# Patient Record
Sex: Female | Born: 1985 | Race: Black or African American | Hispanic: No | Marital: Married | Smoking: Never smoker
Health system: Southern US, Community
[De-identification: ages and names within clinical notes are randomized; demographics above are authoritative.]

## PROBLEM LIST (undated history)

## (undated) DIAGNOSIS — E559 Vitamin D deficiency, unspecified: Secondary | ICD-10-CM

## (undated) DIAGNOSIS — F32A Depression, unspecified: Secondary | ICD-10-CM

## (undated) DIAGNOSIS — M549 Dorsalgia, unspecified: Secondary | ICD-10-CM

## (undated) DIAGNOSIS — E119 Type 2 diabetes mellitus without complications: Secondary | ICD-10-CM

## (undated) DIAGNOSIS — G4730 Sleep apnea, unspecified: Secondary | ICD-10-CM

## (undated) DIAGNOSIS — F329 Major depressive disorder, single episode, unspecified: Secondary | ICD-10-CM

## (undated) DIAGNOSIS — T7840XA Allergy, unspecified, initial encounter: Secondary | ICD-10-CM

## (undated) DIAGNOSIS — K219 Gastro-esophageal reflux disease without esophagitis: Secondary | ICD-10-CM

## (undated) DIAGNOSIS — E282 Polycystic ovarian syndrome: Secondary | ICD-10-CM

## (undated) DIAGNOSIS — R0600 Dyspnea, unspecified: Secondary | ICD-10-CM

## (undated) HISTORY — DX: Vitamin D deficiency, unspecified: E55.9

## (undated) HISTORY — DX: Depression, unspecified: F32.A

## (undated) HISTORY — DX: Type 2 diabetes mellitus without complications: E11.9

## (undated) HISTORY — DX: Dyspnea, unspecified: R06.00

## (undated) HISTORY — DX: Sleep apnea, unspecified: G47.30

## (undated) HISTORY — DX: Allergy, unspecified, initial encounter: T78.40XA

## (undated) HISTORY — DX: Dorsalgia, unspecified: M54.9

## (undated) HISTORY — DX: Major depressive disorder, single episode, unspecified: F32.9

## (undated) HISTORY — PX: WISDOM TOOTH EXTRACTION: SHX21

---

## 2012-05-09 ENCOUNTER — Other Ambulatory Visit: Payer: Self-pay | Admitting: Family Medicine

## 2012-05-09 DIAGNOSIS — G43909 Migraine, unspecified, not intractable, without status migrainosus: Secondary | ICD-10-CM

## 2012-05-12 ENCOUNTER — Other Ambulatory Visit: Payer: Self-pay

## 2012-05-20 ENCOUNTER — Ambulatory Visit (INDEPENDENT_AMBULATORY_CARE_PROVIDER_SITE_OTHER): Payer: 59 | Admitting: Family Medicine

## 2012-05-20 VITALS — BP 118/78 | HR 56 | Temp 98.1°F | Resp 16 | Ht 65.0 in | Wt 261.0 lb

## 2012-05-20 DIAGNOSIS — R197 Diarrhea, unspecified: Secondary | ICD-10-CM

## 2012-05-20 DIAGNOSIS — R1110 Vomiting, unspecified: Secondary | ICD-10-CM

## 2012-05-20 LAB — POCT URINALYSIS DIPSTICK
Nitrite, UA: NEGATIVE
Protein, UA: NEGATIVE
Spec Grav, UA: 1.015
Urobilinogen, UA: 0.2

## 2012-05-20 LAB — POCT UA - MICROSCOPIC ONLY
Crystals, Ur, HPF, POC: NEGATIVE
Yeast, UA: NEGATIVE

## 2012-05-20 LAB — LIPASE: Lipase: <10 U/L (ref 0–75)

## 2012-05-20 LAB — COMPREHENSIVE METABOLIC PANEL
Albumin: 4.9 g/dL (ref 3.5–5.2)
Alkaline Phosphatase: 81 U/L (ref 39–117)
CO2: 27 mEq/L (ref 19–32)
Calcium: 9.8 mg/dL (ref 8.4–10.5)
Chloride: 101 mEq/L (ref 96–112)
Glucose, Bld: 89 mg/dL (ref 70–99)
Potassium: 4.4 mEq/L (ref 3.5–5.3)
Sodium: 137 mEq/L (ref 135–145)
Total Protein: 8.4 g/dL — ABNORMAL HIGH (ref 6.0–8.3)

## 2012-05-20 LAB — POCT CBC
Granulocyte percent: 55.1 %G (ref 37–80)
HCT, POC: 46.1 % (ref 37.7–47.9)
Hemoglobin: 14.7 g/dL (ref 12.2–16.2)
MCV: 89.3 fL (ref 80–97)
POC Granulocyte: 3.9 (ref 2–6.9)
RBC: 5.16 M/uL (ref 4.04–5.48)

## 2012-05-20 MED ORDER — LOPERAMIDE HCL 2 MG PO TABS: 2.0000 mg | ORAL_TABLET | Freq: Four times a day (QID) | ORAL | Status: DC | PRN

## 2012-05-20 MED ORDER — ONDANSETRON 4 MG PO TBDP
8.0000 mg | ORAL_TABLET | Freq: Once | ORAL | Status: AC
  Administered 2012-05-20: 8 mg via ORAL

## 2012-05-20 MED ORDER — PROMETHAZINE HCL 25 MG PO TABS: 25.0000 mg | ORAL_TABLET | Freq: Three times a day (TID) | ORAL | Status: DC | PRN

## 2012-05-20 NOTE — Progress Notes (Signed)
Subjective:    Patient ID: Diane Padilla, female    DOB: 04-12-1986, 26 y.o.   MRN: [id number] Chief Complaint  Patient presents with  . Abdominal Pain    x 1 days  . Nausea  . Diarrhea    HPI  Last night becan having vague abdominal pain and emesis with diarrhea. No sleep last night.  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 Not improving at all. No f/c. Has not been able to keep anything down is able to keep down a little but of water today.  Urine normal. Abd pain still there but improving, intermittent.  No peritoneal signs.  Not sexually active - has been years so very sure no exposures.  Past Medical History  Diagnosis Date  . Allergy    No current outpatient prescriptions on file prior to visit.   No current facility-administered medications on file prior to visit.   History   Social History  . Marital Status: Single    Spouse Name: N/A    Number of Children: N/A  . 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 Smoking status: Never Smoker   . Smokeless tobacco: Never Used  . Alcohol Use: Yes     Comment: rare if ever  . Drug Use: No  . Sexually Active: Not Currently   Other Topics Concern  . None   Social History Narrative  . None     Review of Systems  Constitutional: Positive for activity change, appetite change and fatigue. Negative for fever, chills and unexpected weight change.  Respiratory: Negative for shortness of breath.   Cardiovascular: Negative for chest pain and leg swelling.  Gastrointestinal: Positive for nausea, vomiting, abdominal pain, diarrhea and abdominal distention. Negative for constipation, blood in stool and anal bleeding.  Genitourinary: Negative for dysuria, decreased urine volume and difficulty urinating.  Musculoskeletal: Negative for gait problem.  Skin: Negative for rash.  Hematological: Negative for adenopathy.  Psychiatric/Behavioral:  Positive for sleep disturbance.      BP 118/78  Pulse 56  Temp 98.1 F (36.7 C) (Oral)  Resp 16  Ht 5\' 5"  (1.651 m)  Wt 261 lb (118.389 kg)  BMI 43.43 kg/m2  SpO2 100%  LMP 03/17/2012 Objective:   Physical Exam  Constitutional: She is oriented to person, place, and time. She appears well-developed and well-nourished. No distress.  HENT:  Head: Normocephalic and atraumatic.  Neck: Normal range of motion. Neck supple. No thyromegaly present.  Cardiovascular: Normal rate, regular rhythm, normal heart sounds and intact distal pulses.   Pulmonary/Chest: Effort normal and breath sounds normal. No respiratory distress.  Abdominal: Soft. Bowel sounds are normal. She exhibits no distension and no mass. There is tenderness. There is no rebound, no guarding and no CVA tenderness.  Musculoskeletal: She exhibits no edema.  Lymphadenopathy:    She has no cervical adenopathy.  Neurological: She is alert and oriented to person, place, and time.  Skin: Skin is warm and dry. She is not diaphoretic. No erythema.  Psychiatric: She has a normal mood and affect. Her behavior is normal.          Results for orders placed in visit on 05/20/12  POCT CBC      Component Value Range   WBC 7.1  4.6 - 10.2 K/uL   Lymph, poc 2.8  0.6 - 3.4   POC LYMPH PERCENT 40.0  10 - 50 %  L   MID (cbc) 0.3  0 - 0.9   POC MID % 4.9  0 - 12 %M   POC Granulocyte 3.9  2 - 6.9   Granulocyte percent 55.1  37 - 80 %G   RBC 5.16  4.04 - 5.48 M/uL   Hemoglobin 14.7  12.2 - 16.2 g/dL   HCT, POC 16.1  09.6 - 47.9 %   MCV 89.3  80 - 97 fL   MCH, POC 28.5  27 - 31.2 pg   MCHC 31.9  31.8 - 35.4 g/dL   RDW, POC 04.5     Platelet Count, POC 447 (*) 142 - 424 K/uL   MPV 8.8  0 - 99.8 fL  POCT URINALYSIS DIPSTICK      Component Value Range   Color, UA yellow     Clarity, UA clear     Glucose, UA neg     Bilirubin, UA neg     Ketones, UA neg     Spec Grav, UA 1.015     Blood, UA tr     pH, UA 7.0     Protein, UA neg      Urobilinogen, UA 0.2     Nitrite, UA neg     Leukocytes, UA Trace    POCT UA - MICROSCOPIC ONLY      Component Value Range   WBC, Ur, HPF, POC 10-15     RBC, urine, microscopic 0-2     Bacteria, U Microscopic trace     Mucus, UA neg     Epithelial cells, urine per micros 5-8     Crystals, Ur, HPF, POC neg     Casts, Ur, LPF, POC neg     Yeast, UA neg      Assessment & Plan:  Vomiting - Plan: POCT CBC, Comprehensive metabolic panel, POCT urinalysis dipstick, POCT UA - Microscopic Only, ondansetron (ZOFRAN-ODT) disintegrating tablet 8 mg, Lipase, promethazine (PHENERGAN) 25 MG tablet  Diarrhea - Plan: POCT CBC, Comprehensive metabolic panel, loperamide (IMODIUM A-D) 2 MG tablet  Meds ordered this encounter  Medications  . Vitamin D, Ergocalciferol, (DRISDOL) 50000 UNITS CAPS    Sig: Take 50,000 Units by mouth every 7 (seven) days.  . ondansetron (ZOFRAN-ODT) disintegrating tablet 8 mg    Sig:   . promethazine (PHENERGAN) 25 MG tablet    Sig: Take 1 tablet (25 mg total) by mouth every 8 (eight) hours as needed for nausea.    Dispense:  20 tablet    Refill:  0  . loperamide (IMODIUM A-D) 2 MG tablet    Sig: Take 1 tablet (2 mg total) by mouth 4 (four) times daily as needed for diarrhea or loose stools.    Dispense:  30 tablet    Refill:  0

## 2012-09-24 ENCOUNTER — Ambulatory Visit (INDEPENDENT_AMBULATORY_CARE_PROVIDER_SITE_OTHER): Payer: 59 | Admitting: Physician Assistant

## 2012-09-24 VITALS — BP 110/68 | HR 82 | Temp 98.3°F | Resp 18 | Ht 64.5 in | Wt 275.8 lb

## 2012-09-24 DIAGNOSIS — R059 Cough, unspecified: Secondary | ICD-10-CM

## 2012-09-24 DIAGNOSIS — J029 Acute pharyngitis, unspecified: Secondary | ICD-10-CM

## 2012-09-24 DIAGNOSIS — J3489 Other specified disorders of nose and nasal sinuses: Secondary | ICD-10-CM

## 2012-09-24 DIAGNOSIS — R05 Cough: Secondary | ICD-10-CM

## 2012-09-24 DIAGNOSIS — R0981 Nasal congestion: Secondary | ICD-10-CM

## 2012-09-24 LAB — POCT CBC
Granulocyte percent: 61 %G (ref 37–80)
HCT, POC: 41.9 % (ref 37.7–47.9)
Hemoglobin: 13.6 g/dL (ref 12.2–16.2)
Lymph, poc: 3.3 (ref 0.6–3.4)
MCH, POC: 28.4 pg (ref 27–31.2)
MCHC: 32.5 g/dL (ref 31.8–35.4)
MCV: 87.4 fL (ref 80–97)
MID (cbc): 0.6 (ref 0–0.9)
MPV: 8.9 fL (ref 0–99.8)
POC Granulocyte: 6 (ref 2–6.9)
POC LYMPH PERCENT: 32.9 %L (ref 10–50)
POC MID %: 6.1 %M (ref 0–12)
Platelet Count, POC: 430 10*3/uL — AB (ref 142–424)
RBC: 4.79 M/uL (ref 4.04–5.48)
RDW, POC: 13.3 %
WBC: 9.9 10*3/uL (ref 4.6–10.2)

## 2012-09-24 LAB — POCT INFLUENZA A/B
Influenza A, POC: NEGATIVE
Influenza B, POC: NEGATIVE

## 2012-09-24 MED ORDER — IPRATROPIUM BROMIDE 0.03 % NA SOLN: 2.0000 | Freq: Two times a day (BID) | NASAL | Status: DC

## 2012-09-24 MED ORDER — HYDROCODONE-HOMATROPINE 5-1.5 MG/5ML PO SYRP: 5.0000 mL | ORAL_SOLUTION | Freq: Three times a day (TID) | ORAL | Status: DC | PRN

## 2012-09-24 NOTE — Progress Notes (Signed)
Subjective:    Patient ID: Diane Padilla, female    DOB: January 10, 1986, 27 y.o.   MRN: [id number]  HPI 27 year old female presents with acute onset of nasal congestion, cough, sore throat, chills, and subjective fever.  Admits symptoms started suddenly yesterday and have progressively worsened today.  Denies sinus pressure, otalgia, nausea, vomiting, abdominal pain, headache, or dizziness.  No OTC treatments tried yet.    Patient otherwise healthy with no other concerns today.  Admits to daily multivitamin.   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    Review of Systems  Constitutional: Positive for fever (subjective) and chills.  HENT: Positive for congestion, sore throat and rhinorrhea. Negative for ear pain, neck stiffness and postnasal drip.   Respiratory: Positive for cough. Negative for shortness of breath and wheezing.   Gastrointestinal: Negative for nausea, vomiting and abdominal pain.  Neurological: Negative for dizziness, light-headedness and headaches.       Objective:   Physical Exam  Constitutional: She is oriented to person, place, and time. She appears well-developed and well-nourished.  HENT:  Head: Normocephalic and atraumatic.  Right Ear: Hearing, tympanic membrane, external ear and ear canal normal.  Left Ear: Hearing, tympanic membrane, external ear and ear canal normal.  Nose: Right sinus exhibits no maxillary sinus tenderness and no frontal sinus tenderness. Left sinus exhibits no maxillary sinus tenderness and no frontal sinus tenderness.  Mouth/Throat: Uvula is midline, oropharynx is clear and moist and mucous membranes are normal. No oropharyngeal exudate.  Eyes: Conjunctivae are normal.  Neck: Normal range of motion. Neck supple.  Cardiovascular: Normal rate, regular rhythm and normal heart sounds.   Pulmonary/Chest: Effort normal and breath sounds normal.  Lymphadenopathy:    She has no cervical adenopathy.  Neurological: She is alert and oriented to person,  place, and time.  Psychiatric: She has a normal mood and affect. Her behavior is normal. Judgment and thought content normal.     Results for orders placed in visit on 09/24/12  POCT INFLUENZA A/B      Result Value Range   Influenza A, POC Negative     Influenza B, POC Negative    POCT CBC      Result Value Range   WBC 9.9  4.6 - 10.2 K/uL   Lymph, poc 3.3  0.6 - 3.4   POC LYMPH PERCENT 32.9  10 - 50 %L   MID (cbc) 0.6  0 - 0.9   POC MID % 6.1  0 - 12 %M   POC Granulocyte 6.0  2 - 6.9   Granulocyte percent 61.0  37 - 80 %G   RBC 4.79  4.04 - 5.48 M/uL   Hemoglobin 13.6  12.2 - 16.2 g/dL   HCT, POC 57.8  46.9 - 47.9 %   MCV 87.4  80 - 97 fL   MCH, POC 28.4  27 - 31.2 pg   MCHC 32.5  31.8 - 35.4 g/dL   RDW, POC 62.9     Platelet Count, POC 430 (*) 142 - 424 K/uL   MPV 8.9  0 - 99.8 fL        Assessment & Plan:  Nasal congestion - Plan: POCT Influenza A/B  Cough - Plan: POCT Influenza A/B, POCT CBC  Acute pharyngitis - Plan: POCT CBC  Likely viral illness.   Will treat symptomatically with Atrovent NS and Hycodan q8hours as needed.  If no improvement in 48-72 hours, may call in amoxicillin 875 mg bid x 7 days  Follow up indicated if cough becomes productive or fever of greater than 100.0 develops.

## 2012-09-26 ENCOUNTER — Ambulatory Visit (INDEPENDENT_AMBULATORY_CARE_PROVIDER_SITE_OTHER): Payer: 59 | Admitting: Nurse Practitioner

## 2012-09-26 ENCOUNTER — Encounter: Payer: Self-pay | Admitting: Nurse Practitioner

## 2012-09-26 VITALS — BP 120/64 | HR 70 | Resp 18 | Ht 65.0 in | Wt 275.0 lb

## 2012-09-26 DIAGNOSIS — Z0000 Encounter for general adult medical examination without abnormal findings: Secondary | ICD-10-CM

## 2012-09-26 DIAGNOSIS — B3731 Acute candidiasis of vulva and vagina: Secondary | ICD-10-CM

## 2012-09-26 DIAGNOSIS — B373 Candidiasis of vulva and vagina: Secondary | ICD-10-CM

## 2012-09-26 LAB — POCT URINALYSIS DIPSTICK
Bilirubin, UA: NEGATIVE
Leukocytes, UA: NEGATIVE
Nitrite, UA: NEGATIVE

## 2012-09-26 MED ORDER — FLUCONAZOLE 150 MG PO TABS: 150.0000 mg | ORAL_TABLET | Freq: Once | ORAL | Status: DC

## 2012-09-26 NOTE — Progress Notes (Signed)
Subjective:     Patient ID: Diane Padilla, female   DOB: 14-Nov-1985, 27 y.o.   MRN: [id number] Patient was scheduled for AEX but appointment as too early, she will be rescheduled for later.  Vaginal Itching The patient's primary symptoms include genital itching, a genital odor and a vaginal discharge. The patient's pertinent negatives include no genital rash or vaginal bleeding. Primary symptoms comment: Pt. noted an small "bump" in the area above mons at the hair line.  Area is not painful but uncomfortable.  She does not shave this area.  Also vaginal discharge that is white, itching, and musty odor.. This is a new problem. Episode onset: 1.5  - 2 wks. The problem occurs daily. The problem has been unchanged. The pain is mild. Affected Side: middle. She is not pregnant. Associated symptoms include diarrhea, frequency, headaches, rash and a sore throat. Associated symptoms comments: On 09/23/12  went to PCP and diagnosed with viral URI. Treated with nasal spray ad cough meds. No fever/chills. States she was given medications but did not take them.  Her symptoms are some better on their own.  Urinary frequency is normal for her.. The vaginal discharge was clear, milky and white. There has been no bleeding. She has tried nothing for the symptoms. She is not sexually active. Her menstrual history has been irregular (Mense irregular secondary to PCOS.  Her lLMP was  08/17/12 without provera challenge (since pt. refused to take).  PMP was 02/2012.).     Review of Systems  Constitutional: Positive for fatigue.  HENT: Positive for sore throat, rhinorrhea, postnasal drip and sinus pressure.        Recent viral URI symptoms  Respiratory: Positive for cough.   Gastrointestinal: Positive for diarrhea.       History of possible IBS  Genitourinary: Positive for frequency, vaginal discharge and menstrual problem.       History of iregular menses  Skin: Positive for rash.  Neurological: Positive for headaches.   Psychiatric/Behavioral: Negative.        Objective:   Physical Exam  Constitutional: Vital signs are normal. She appears well-developed and well-nourished.  Abdominal: Soft. She exhibits no distension and no mass. There is no tenderness. There is no rebound and no guarding.  Genitourinary: Vaginal discharge found.  White milky vagina discharge. Ph: 5 KOH: + yeast Saline: - clue  Neurological: She is alert.  Skin: Skin is warm and dry.  At the top of mons pubis at midline is a very small papule that looks like an ingrown hair.  Very small about 2 mm in size without exudate or pain. No intervention needed.   Psychiatric: She has a normal mood and affect. Her behavior is normal. Judgment and thought content normal.       Assessment:      Yeast Vaginitis acute an chronic  Sebaceous cyst at top of mons pubis  Hx. Oligo/hypomenorrhea secondary to most likely PCOS    Plan:      Diflucan 150 mg today and repeat in 48 hrs. Then weekly X 2 more doses.  At AEX in a month  may even try a weekly suppression dose for her for several months.  For the cyst on upper mons she may apply warm compress and treat symptomatic otherwise no  intervention is needed.  Still encourage pt to keep menses record.

## 2012-09-26 NOTE — Patient Instructions (Addendum)
Monilial Vaginitis Vaginitis in a soreness, swelling and redness (inflammation) of the vagina and vulva. Monilial vaginitis is not a sexually transmitted infection. CAUSES  Yeast vaginitis is caused by yeast (candida) that is normally found in your vagina. With a yeast infection, the candida has overgrown in number to a point that upsets the chemical balance. SYMPTOMS   White, thick vaginal discharge.  Swelling, itching, redness and irritation of the vagina and possibly the lips of the vagina (vulva).  Burning or painful urination.  Painful intercourse. DIAGNOSIS  Things that may contribute to monilial vaginitis are:  Postmenopausal and virginal states.  Pregnancy.  Infections.  Being tired, sick or stressed, especially if you had monilial vaginitis in the past.  Diabetes. Good control will help lower the chance.  Birth control pills.  Tight fitting garments.  Using bubble bath, feminine sprays, douches or deodorant tampons.  Taking certain medications that kill germs (antibiotics).  Sporadic recurrence can occur if you become ill. TREATMENT  Your caregiver will give you medication.  There are several kinds of anti monilial vaginal creams and suppositories specific for monilial vaginitis. For recurrent yeast infections, use a suppository or cream in the vagina 2 times a week, or as directed.  Anti-monilial or steroid cream for the itching or irritation of the vulva may also be used. Get your caregiver's permission.  Painting the vagina with methylene blue solution may help if the monilial cream does not work.  Eating yogurt may help prevent monilial vaginitis. HOME CARE INSTRUCTIONS   Finish all medication as prescribed.  Do not have sex until treatment is completed or after your caregiver tells you it is okay.  Take warm sitz baths.  Do not douche.  Do not use tampons, especially scented ones.  Wear cotton underwear.  Avoid tight pants and panty  hose.  Tell your sexual partner that you have a yeast infection. They should go to their caregiver if they have symptoms such as mild rash or itching.  Your sexual partner should be treated as well if your infection is difficult to eliminate.  Practice safer sex. Use condoms.  Some vaginal medications cause latex condoms to fail. Vaginal medications that harm condoms are:  Cleocin cream.  Butoconazole (Femstat).  Terconazole (Terazol) vaginal suppository.  Miconazole (Monistat) (may be purchased over the counter). SEEK MEDICAL CARE IF:   You have a temperature by mouth above 102 F (38.9 C).  The infection is getting worse after 2 days of treatment.  The infection is not getting better after 3 days of treatment.  You develop blisters in or around your vagina.  You develop vaginal bleeding, and it is not your menstrual period.  You have pain when you urinate.  You develop intestinal problems.  You have pain with sexual intercourse. Document Released: 03/21/2005 Document Revised: 09/03/2011 Document Reviewed: 12/03/2008 [employer] Patient Information 2013 [employer].  

## 2012-09-28 NOTE — Progress Notes (Signed)
Encounter reviewed by Dr. Anayansi Rundquist Silva.  

## 2012-11-18 ENCOUNTER — Ambulatory Visit: Payer: 59 | Admitting: Nurse Practitioner

## 2012-12-10 ENCOUNTER — Telehealth: Payer: Self-pay | Admitting: Nurse Practitioner

## 2012-12-10 NOTE — Telephone Encounter (Signed)
Patient is having discharge and itching .Needs an appointment as soon as possible. Can't find a spot to put her in.

## 2012-12-11 ENCOUNTER — Encounter: Payer: Self-pay | Admitting: *Deleted

## 2012-12-11 NOTE — Telephone Encounter (Signed)
Spoke with pt who has been having itching and discharge since about Monday. Requesting appt. Scheduled OV with TL tomorrow at 10:45.

## 2012-12-12 ENCOUNTER — Ambulatory Visit (INDEPENDENT_AMBULATORY_CARE_PROVIDER_SITE_OTHER): Payer: 59 | Admitting: Gynecology

## 2012-12-12 VITALS — BP 114/68 | HR 80 | Resp 18 | Ht 65.0 in | Wt 276.0 lb

## 2012-12-12 DIAGNOSIS — N898 Other specified noninflammatory disorders of vagina: Secondary | ICD-10-CM

## 2012-12-12 DIAGNOSIS — N915 Oligomenorrhea, unspecified: Secondary | ICD-10-CM

## 2012-12-12 MED ORDER — MEDROXYPROGESTERONE ACETATE 10 MG PO TABS: 10.0000 mg | ORAL_TABLET | Freq: Every day | ORAL | Status: DC

## 2012-12-12 NOTE — Patient Instructions (Signed)
Watch menses withdraw bleed, should lighten over the ensuring months, call for contraception if become sexually active

## 2012-12-12 NOTE — Progress Notes (Signed)
26 y.o.Single African American female [id number] with a 5 day(s) history of the following:discharge described as white and curd-like Sexually active: no  Pt also reports the following associated symptoms: none Patient has not tried over the counter treatment.Pt has had multiple yeast infections in the past with similar symptoms and reports relief after treatments, no treatment failures. No douching. In addition, pt reports rare menstrual cycles, hesitant to take medications, LMP 100m ago    Exam:  RUE:[id number]                Vag:no lesions, pH 4.5, wet prep done                Cx:  normal appearance                Uterus:normal shape and consistency                Adnexa: no mass, fullness, tenderness  Wet Prep shows:vaginal discharge and no patholgens   Dx:no pathogens identified causing these symptoms   JX:BJYN, pt assured Oligomenorrhea- discussed the risks of lack of menses, risks of uterine cancer discussed, recommend as pt is not sexually active monthly progestin, aware initial cycles will be heavy but should lighten with time.  Pt is aware if sexually active, will need contraceptive dose-agrees

## 2013-01-09 ENCOUNTER — Ambulatory Visit: Payer: 59 | Admitting: Gynecology

## 2013-01-09 ENCOUNTER — Encounter: Payer: Self-pay | Admitting: Gynecology

## 2013-01-09 ENCOUNTER — Ambulatory Visit (INDEPENDENT_AMBULATORY_CARE_PROVIDER_SITE_OTHER): Payer: 59 | Admitting: Gynecology

## 2013-01-09 VITALS — BP 132/76 | HR 74 | Resp 14 | Ht 65.0 in | Wt 282.2 lb

## 2013-01-09 DIAGNOSIS — N912 Amenorrhea, unspecified: Secondary | ICD-10-CM

## 2013-01-09 DIAGNOSIS — E282 Polycystic ovarian syndrome: Secondary | ICD-10-CM | POA: Insufficient documentation

## 2013-01-09 DIAGNOSIS — Z113 Encounter for screening for infections with a predominantly sexual mode of transmission: Secondary | ICD-10-CM

## 2013-01-09 DIAGNOSIS — N898 Other specified noninflammatory disorders of vagina: Secondary | ICD-10-CM

## 2013-01-09 MED ORDER — MEDROXYPROGESTERONE ACETATE 10 MG PO TABS: 10.0000 mg | ORAL_TABLET | Freq: Every day | ORAL | Status: DC

## 2013-01-09 MED ORDER — LEVONORGESTREL-ETHINYL ESTRAD 0.1-20 MG-MCG PO TABS: 1.0000 | ORAL_TABLET | Freq: Every day | ORAL | Status: DC

## 2013-01-09 NOTE — Progress Notes (Signed)
Subjective:     Patient ID: Diane Padilla, female   DOB: January 23, 1986, 27 y.o.   MRN: [id number]  HPI Comments: Pt here for pregnancy test.  When seen last in office was not sexually active so declined contraception.  Pt states that she is now in a relationship and has been having sex without condoms.  Pt states that she didn't think she could get pregnant because of her PCOS.  We had discussed the risks of uterine cancer with prolonged periods of amenorrhea.  LMP February 2014, prior 04/2012.  Pt had been virginal before this.  Pt is unsure if they will remain sexually active at this time, may want to hold off for marriage    Review of Systems  Genitourinary: Positive for vaginal bleeding (related to first coitus), vaginal discharge (noticed with since sex), vaginal pain (first sexual contact ) and menstrual problem (pcos, oligomenorrhea). Negative for genital sores and pelvic pain.  All other systems reviewed and are negative.       Objective:   Physical Exam  Constitutional: She is oriented to person, place, and time. She appears well-developed and well-nourished.  Neurological: She is alert and oriented to person, place, and time.  Skin: Skin is warm and dry.  Pelvic exam: VULVA: normal appearing vulva with no masses, tenderness or lesions, VAGINA: normal appearing vagina with normal color and discharge, no lesions, CERVIX: normal appearing cervix without discharge or lesions, Nabothian cyst at 12 o'clock, UTERUS: uterus is normal size, shape, consistency and nontender, ADNEXA: no masses, limited by habitus.      Assessment:     Oligomenorrhea Contraceptive management PCOS     Plan:     Discussed again the risks of anovulatory bleeding, as she has been sexually active, we should consider contraception.  We reviewed oral and nonoral options-ocp, implants, IUD.  Pt was informed to expect irregular bleeding initially since she has not had a cycle in several months and there will be  build up, she understands, and would like to try ocp Stressed importance of condoms regularly GC/CTM from cervix WP not done

## 2013-01-09 NOTE — Patient Instructions (Signed)
Polycystic Ovarian Syndrome Polycystic ovarian syndrome is a condition with a number of problems. One problem is with the ovaries. The ovaries are organs located in the female pelvis, on each side of the uterus. Usually, during the menstrual cycle, an egg is released from 1 ovary every month. This is called ovulation. When the egg is fertilized, it goes into the womb (uterus), which allows for the growth of a baby. The egg travels from the ovary through the fallopian tube to the uterus. The ovaries also make the hormones estrogen and progesterone. These hormones help the development of a woman's breasts, body shape, and body hair. They also regulate the menstrual cycle and pregnancy. Sometimes, cysts form in the ovaries. A cyst is a fluid-filled sac. On the ovary, different types of cysts can form. The most common type of ovarian cyst is called a functional or ovulation cyst. It is normal, and often forms during the normal menstrual cycle. Each month, a woman's ovaries grow tiny cysts that hold the eggs. When an egg is fully grown, the sac breaks open. This releases the egg. Then, the sac which released the egg from the ovary dissolves. In one type of functional cyst, called a follicle cyst, the sac does not break open to release the egg. It may actually continue to grow. This type of cyst usually disappears within 1 to 3 months.  One type of cyst problem with the ovaries is called Polycystic Ovarian Syndrome (PCOS). In this condition, many follicle cysts form, but do not rupture and produce an egg. This health problem can affect the following:  Menstrual cycle.  Heart.  Obesity.  Cancer of the uterus.  Fertility.  Blood vessels.  Hair growth (face and body) or baldness.  Hormones.  Appearance.  High blood pressure.  Stroke.  Insulin production.  Inflammation of the liver.  Elevated blood cholesterol and triglycerides. CAUSES   No one knows the exact cause of PCOS.  Women with  PCOS often have a mother or sister with PCOS. There is not yet enough proof to say this is inherited.  Many women with PCOS have a weight problem.  Researchers are looking at the relationship between PCOS and the body's ability to make insulin. Insulin is a hormone that regulates the change of sugar, starches, and other food into energy for the body's use, or for storage. Some women with PCOS make too much insulin. It is possible that the ovaries react by making too many female hormones, called androgens. This can lead to acne, excessive hair growth, weight gain, and ovulation problems.  Too much production of luteinizing hormone (LH) from the pituitary gland in the brain stimulates the ovary to produce too much female hormone (androgen). SYMPTOMS   Infrequent or no menstrual periods, and/or irregular bleeding.  Inability to get pregnant (infertility), because of not ovulating.  Increased growth of hair on the face, chest, stomach, back, thumbs, thighs, or toes.  Acne, oily skin, or dandruff.  Pelvic pain.  Weight gain or obesity, usually carrying extra weight around the waist.  Type 2 diabetes (this is the diabetes that usually does not need insulin).  High cholesterol.  High blood pressure.  Female-pattern baldness or thinning hair.  Patches of thickened and dark brown or black skin on the neck, arms, breasts, or thighs.  Skin tags, or tiny excess flaps of skin, in the armpits or neck area.  Sleep apnea (excessive snoring and breathing stops at times while asleep).  Deepening of the voice.  Gestational diabetes when pregnant.  Increased risk of miscarriage with pregnancy. DIAGNOSIS  There is no single test to diagnose PCOS.   Your caregiver will:  Take a medical history.  Perform a pelvic exam.  Perform an ultrasound.  Check your female and female hormone levels.  Measure glucose or sugar levels in the blood.  Do other blood tests.  If you are producing too many  female hormones, your caregiver will make sure it is from PCOS. At the physical exam, your caregiver will want to evaluate the areas of increased hair growth. Try to allow natural hair growth for a few days before the visit.  During a pelvic exam, the ovaries may be enlarged or swollen by the increased number of small cysts. This can be seen more easily by vaginal ultrasound or screening, to examine the ovaries and lining of the uterus (endometrium) for cysts. The uterine lining may become thicker, if there has not been a regular period. TREATMENT  Because there is no cure for PCOS, it needs to be managed to prevent problems. Treatments are based on your symptoms. Treatment is also based on whether you want to have a baby or whether you need contraception.  Treatment may include:  Progesterone hormone, to start a menstrual period.  Birth control pills, to make you have regular menstrual periods.  Medicines to make you ovulate, if you want to get pregnant.  Medicines to control your insulin.  Medicine to control your blood pressure.  Medicine and diet, to control your high cholesterol and triglycerides in your blood.  Surgery, making small holes in the ovary, to decrease the amount of female hormone production. This is done through a long, lighted tube (laparoscope), placed into the pelvis through a tiny incision in the lower abdomen. Your caregiver will go over some of the choices with you. WOMEN WITH PCOS HAVE THESE CHARACTERISTICS:  High levels of female hormones called androgens.  An irregular or no menstrual cycle.  May have many small cysts in their ovaries. PCOS is the most common hormonal reproductive problem in women of childbearing age. WHY DO WOMEN WITH PCOS HAVE TROUBLE WITH THEIR MENSTRUAL CYCLE? Each month, about 20 eggs start to mature in the ovaries. As one egg grows and matures, the follicle breaks open to release the egg, so it can travel through the fallopian tube for  fertilization. When the single egg leaves the follicle, ovulation takes place. In women with PCOS, the ovary does not make all of the hormones it needs for any of the eggs to fully mature. They may start to grow and accumulate fluid, but no one egg becomes large enough. Instead, some may remain as cysts. Since no egg matures or is released, ovulation does not occur and the hormone progesterone is not made. Without progesterone, a woman's menstrual cycle is irregular or absent. Also, the cysts produce female hormones, which continue to prevent ovulation.  Document Released: 10/05/2004 Document Revised: 09/03/2011 Document Reviewed: 04/29/2009 [facility] Patient Information 2014 [place], [place]. Oral Contraception Information Oral contraceptives (OCs) are medicines taken to prevent pregnancy. OCs work by preventing the ovaries from releasing eggs. The hormones in OCs also cause the cervical mucus to thicken, preventing the sperm from entering the uterus. The hormones also cause the uterine lining to become thin, not allowing a fertilized egg to attach to the inside of the uterus. OCs are highly effective when taken exactly as prescribed. However, OCs do not prevent sexually transmitted diseases (STDs). Safe sex practices, such as  using condoms along with the pill, can help prevent STDs.  Before taking the pill, you may have a physical exam and Pap test. Your caregiver may order blood tests that may be necessary. Your caregiver will make sure you are a good candidate for oral contraception. Discuss with your caregiver the possible side effects of the OC you may be prescribed. When starting an OC, it can take 2 to 3 months for the body to adjust to the changes in hormone levels in your body.  TYPES OF ORAL CONTRACEPTION  The combination pill. This pill contains estrogen and progestin (synthetic progesterone) hormones. The combination pill comes in either 21-day or 28-day packs. With 21-day packs, you do not take  pills for 7 days after the last pill. With 28-day packs, the pill is taken every day. The last 7 pills are without hormones. Certain types of pills have more than 21 hormone-containing pills.  The minipill. This pill contains the progesterone hormone only. It is taken every day continuously. The minipill comes in packs of 91 pills. The first 84 pills contain the hormones, and the last 7 pills do not. The last 7 days are when you will have your menstrual period. You may experience irregular spotting. ADVANTAGES  Decreases premenstrual symptoms.  Treats menstrual period cramps.  Regulates the menstrual cycle.  Decreases a heavy menstrual flow.  Treats acne.  Treats abnormal uterine bleeding.  Treats chronic pelvic pain.  Treats polycystic ovarian syndrome.  Treats endometriosis.  Can be used as emergency contraception. DISADVANTAGES OCs can be less effective if:  You forget to take the pill at the same time every day.  You have a stomach or intestinal disease that lessens the absorption of the pill.  You take OCs with other medicines that make OCs less effective.  You take expired OCs.  You forget to restart the pill on day 7, when using the packs of 21 pills. Document Released: 09/01/2002 Document Revised: 09/03/2011 Document Reviewed: 10/18/2010 [facility] Patient Information 2014 [place], [place].

## 2013-01-13 LAB — IPS N GONORRHOEA AND CHLAMYDIA BY PCR

## 2013-02-02 ENCOUNTER — Ambulatory Visit (INDEPENDENT_AMBULATORY_CARE_PROVIDER_SITE_OTHER): Payer: 59 | Admitting: Emergency Medicine

## 2013-02-02 VITALS — BP 134/80 | HR 75 | Temp 98.0°F | Resp 18 | Ht 65.5 in | Wt 282.8 lb

## 2013-02-02 DIAGNOSIS — N946 Dysmenorrhea, unspecified: Secondary | ICD-10-CM

## 2013-02-02 DIAGNOSIS — J039 Acute tonsillitis, unspecified: Secondary | ICD-10-CM

## 2013-02-02 MED ORDER — PENICILLIN V POTASSIUM 500 MG PO TABS: 500.0000 mg | ORAL_TABLET | Freq: Four times a day (QID) | ORAL | Status: DC

## 2013-02-02 NOTE — Patient Instructions (Addendum)
Strep Throat  Strep throat is an infection of the throat caused by a bacteria named Streptococcus pyogenes. Your caregiver may call the infection streptococcal "tonsillitis" or "pharyngitis" depending on whether there are signs of inflammation in the tonsils or back of the throat. Strep throat is most common in children aged 29 15 years during the cold months of the year, but it can occur in people of any age during any season. This infection is spread from person to person (contagious) through coughing, sneezing, or other close contact.  SYMPTOMS   · Fever or chills.  · Painful, swollen, red tonsils or throat.  · Pain or difficulty when swallowing.  · White or yellow spots on the tonsils or throat.  · Swollen, tender lymph nodes or "glands" of the neck or under the jaw.  · Red rash all over the body (rare).  DIAGNOSIS   Many different infections can cause the same symptoms. A test must be done to confirm the diagnosis so the right treatment can be given. A "rapid strep test" can help your caregiver make the diagnosis in a few minutes. If this test is not available, a light swab of the infected area can be used for a throat culture test. If a throat culture test is done, results are usually available in a day or two.  TREATMENT   Strep throat is treated with antibiotic medicine.  HOME CARE INSTRUCTIONS   · Gargle with 1 tsp of salt in 1 cup of warm water, 3 4 times per day or as needed for comfort.  · Family members who also have a sore throat or fever should be tested for strep throat and treated with antibiotics if they have the strep infection.  · Make sure everyone in your household washes their hands well.  · Do not share food, drinking cups, or personal items that could cause the infection to spread to others.  · You may need to eat a soft food diet until your sore throat gets better.  · Drink enough water and fluids to keep your urine clear or pale yellow. This will help prevent dehydration.  · Get plenty of  rest.  · Stay home from school, daycare, or work until you have been on antibiotics for 24 hours.  · Only take over-the-counter or prescription medicines for pain, discomfort, or fever as directed by your caregiver.  · If antibiotics are prescribed, take them as directed. Finish them even if you start to feel better.  SEEK MEDICAL CARE IF:   · The glands in your neck continue to enlarge.  · You develop a rash, cough, or earache.  · You cough up green, yellow-brown, or bloody sputum.  · You have pain or discomfort not controlled by medicines.  · Your problems seem to be getting worse rather than better.  SEEK IMMEDIATE MEDICAL CARE IF:   · You develop any new symptoms such as vomiting, severe headache, stiff or painful neck, chest pain, shortness of breath, or trouble swallowing.  · You develop severe throat pain, drooling, or changes in your voice.  · You develop swelling of the neck, or the skin on the neck becomes red and tender.  · You have a fever.  · You develop signs of dehydration, such as fatigue, dry mouth, and decreased urination.  · You become increasingly sleepy, or you cannot wake up completely.  Document Released: 06/08/2000 Document Revised: 05/28/2012 Document Reviewed: 08/10/2010  Exit[employer]® Patient Information ©2014 [employer].

## 2013-02-02 NOTE — Progress Notes (Signed)
[facility] [address], [place] [address] [phone]  Date:  02/02/2013   Name:  Diane Padilla   DOB:  [birth year]   MRN:  [id number]  PCP:  No PCP Per Patient    Chief Complaint: Menstrual Problem, Sore Throat and Cough   History of Present Illness:  Diane Padilla is a 27 y.o. very pleasant female patient who presents with the following:  Ill with sore throat Thursday.  No fever or chills.  No nausea or vomiting.  Clear nasal drainage occasionally mucopurulent.  Has post nasal drainage.  No nausea or vomiting.  No cough.  No improvement with over the counter medications or other home remedies. Denies other complaint or health concern today.   Patient Active Problem List   Diagnosis Date Noted  . PCOS (polycystic ovarian syndrome) 01/09/2013  . Oligomenorrhea 12/12/2012    Past Medical History  Diagnosis Date  . Allergy     History reviewed. No pertinent past surgical history.  History  Substance Use Topics  . Smoking status: Never Smoker   . Smokeless tobacco: Never Used  . Alcohol Use: Yes     Comment: rare if ever    Family History  Problem Relation Age of Onset  . Diabetes Mother   . Hypertension Mother   . Hypertension Maternal Grandmother   . Hyperlipidemia Maternal Grandmother     No Known Allergies  Medication list has been reviewed and updated.  Current Outpatient Prescriptions on File Prior to Visit  Medication Sig Dispense Refill  . levonorgestrel-ethinyl estradiol (AVIANE,ALESSE,LESSINA) 0.1-20 MG-MCG tablet Take 1 tablet by mouth daily.  1 Package  12  . medroxyPROGESTERone (PROVERA) 10 MG tablet Take 1 tablet (10 mg total) by mouth daily.  10 tablet  0   No current facility-administered medications on file prior to visit.    Review of Systems:  As per HPI, otherwise negative.    Physical Examination: Filed Vitals:   02/02/13 1353  BP: 134/80  Pulse: 75  Temp: 98 F (36.7 C)  Resp: 18   Filed Vitals:    02/02/13 1353  Height: 5' 5.5" (1.664 m)  Weight: 282 lb 12.8 oz (128.277 kg)   Body mass index is 46.33 kg/(m^2). Ideal Body Weight: Weight in (lb) to have BMI = 25: 152.2  GEN: WDWN, NAD, Non-toxic, A & O x 3 HEENT: Atraumatic, Normocephalic. Neck supple. No masses, No LAD.  Exudative tonsillitis Ears and Nose: No external deformity. CV: RRR, No M/G/R. No JVD. No thrill. No extra heart sounds. PULM: CTA B, no wheezes, crackles, rhonchi. No retractions. No resp. distress. No accessory muscle use. ABD: S, NT, ND, +BS. No rebound. No HSM. EXTR: No c/c/e NEURO Normal gait.  PSYCH: Normally interactive. Conversant. Not depressed or anxious appearing.  Calm demeanor.    Assessment and Plan: Tonsillitis penvk   Signed,  Phillips Odor, MD

## 2013-03-13 ENCOUNTER — Ambulatory Visit: Payer: 59 | Admitting: Gynecology

## 2013-03-20 ENCOUNTER — Encounter: Payer: Self-pay | Admitting: Nurse Practitioner

## 2013-03-20 ENCOUNTER — Ambulatory Visit (INDEPENDENT_AMBULATORY_CARE_PROVIDER_SITE_OTHER): Payer: 59 | Admitting: Nurse Practitioner

## 2013-03-20 VITALS — BP 130/88 | HR 88 | Ht 65.0 in | Wt 292.0 lb

## 2013-03-20 DIAGNOSIS — N912 Amenorrhea, unspecified: Secondary | ICD-10-CM

## 2013-03-20 DIAGNOSIS — E282 Polycystic ovarian syndrome: Secondary | ICD-10-CM

## 2013-03-20 MED ORDER — MEDROXYPROGESTERONE ACETATE 10 MG PO TABS: 10.0000 mg | ORAL_TABLET | Freq: Every day | ORAL | Status: DC

## 2013-03-20 NOTE — Patient Instructions (Addendum)
Polycystic Ovarian Syndrome  Polycystic ovarian syndrome is a condition with a number of problems. One problem is with the ovaries. The ovaries are organs located in the female pelvis, on each side of the uterus. Usually, during the menstrual cycle, an egg is released from 1 ovary every month. This is called ovulation. When the egg is fertilized, it goes into the womb (uterus), which allows for the growth of a baby. The egg travels from the ovary through the fallopian tube to the uterus. The ovaries also make the hormones estrogen and progesterone. These hormones help the development of a woman's breasts, body shape, and body hair. They also regulate the menstrual cycle and pregnancy.  Sometimes, cysts form in the ovaries. A cyst is a fluid-filled sac. On the ovary, different types of cysts can form. The most common type of ovarian cyst is called a functional or ovulation cyst. It is normal, and often forms during the normal menstrual cycle. Each month, a woman's ovaries grow tiny cysts that hold the eggs. When an egg is fully grown, the sac breaks open. This releases the egg. Then, the sac which released the egg from the ovary dissolves. In one type of functional cyst, called a follicle cyst, the sac does not break open to release the egg. It may actually continue to grow. This type of cyst usually disappears within 1 to 3 months.   One type of cyst problem with the ovaries is called Polycystic Ovarian Syndrome (PCOS). In this condition, many follicle cysts form, but do not rupture and produce an egg. This health problem can affect the following:  · Menstrual cycle.  · Heart.  · Obesity.  · Cancer of the uterus.  · Fertility.  · Blood vessels.  · Hair growth (face and body) or baldness.  · Hormones.  · Appearance.  · High blood pressure.  · Stroke.  · Insulin production.  · Inflammation of the liver.  · Elevated blood cholesterol and triglycerides.  CAUSES   · No one knows the exact cause of PCOS.  · Women with  PCOS often have a mother or sister with PCOS. There is not yet enough proof to say this is inherited.  · Many women with PCOS have a weight problem.  · Researchers are looking at the relationship between PCOS and the body's ability to make insulin. Insulin is a hormone that regulates the change of sugar, starches, and other food into energy for the body's use, or for storage. Some women with PCOS make too much insulin. It is possible that the ovaries react by making too many female hormones, called androgens. This can lead to acne, excessive hair growth, weight gain, and ovulation problems.  · Too much production of luteinizing hormone (LH) from the pituitary gland in the brain stimulates the ovary to produce too much female hormone (androgen).  SYMPTOMS   · Infrequent or no menstrual periods, and/or irregular bleeding.  · Inability to get pregnant (infertility), because of not ovulating.  · Increased growth of hair on the face, chest, stomach, back, thumbs, thighs, or toes.  · Acne, oily skin, or dandruff.  · Pelvic pain.  · Weight gain or obesity, usually carrying extra weight around the waist.  · Type 2 diabetes (this is the diabetes that usually does not need insulin).  · High cholesterol.  · High blood pressure.  · Female-pattern baldness or thinning hair.  · Patches of thickened and dark brown or black skin on the neck, arms, breasts,   or thighs.  · Skin tags, or tiny excess flaps of skin, in the armpits or neck area.  · Sleep apnea (excessive snoring and breathing stops at times while asleep).  · Deepening of the voice.  · Gestational diabetes when pregnant.  · Increased risk of miscarriage with pregnancy.  DIAGNOSIS   There is no single test to diagnose PCOS.   · Your caregiver will:  · Take a medical history.  · Perform a pelvic exam.  · Perform an ultrasound.  · Check your female and female hormone levels.  · Measure glucose or sugar levels in the blood.  · Do other blood tests.  · If you are producing too many  female hormones, your caregiver will make sure it is from PCOS. At the physical exam, your caregiver will want to evaluate the areas of increased hair growth. Try to allow natural hair growth for a few days before the visit.  · During a pelvic exam, the ovaries may be enlarged or swollen by the increased number of small cysts. This can be seen more easily by vaginal ultrasound or screening, to examine the ovaries and lining of the uterus (endometrium) for cysts. The uterine lining may become thicker, if there has not been a regular period.  TREATMENT   Because there is no cure for PCOS, it needs to be managed to prevent problems. Treatments are based on your symptoms. Treatment is also based on whether you want to have a baby or whether you need contraception.   Treatment may include:  · Progesterone hormone, to start a menstrual period.  · Birth control pills, to make you have regular menstrual periods.  · Medicines to make you ovulate, if you want to get pregnant.  · Medicines to control your insulin.  · Medicine to control your blood pressure.  · Medicine and diet, to control your high cholesterol and triglycerides in your blood.  · Surgery, making small holes in the ovary, to decrease the amount of female hormone production. This is done through a long, lighted tube (laparoscope), placed into the pelvis through a tiny incision in the lower abdomen.  Your caregiver will go over some of the choices with you.  WOMEN WITH PCOS HAVE THESE CHARACTERISTICS:  · High levels of female hormones called androgens.  · An irregular or no menstrual cycle.  · May have many small cysts in their ovaries.  PCOS is the most common hormonal reproductive problem in women of childbearing age.  WHY DO WOMEN WITH PCOS HAVE TROUBLE WITH THEIR MENSTRUAL CYCLE?  Each month, about 20 eggs start to mature in the ovaries. As one egg grows and matures, the follicle breaks open to release the egg, so it can travel through the fallopian tube for  fertilization. When the single egg leaves the follicle, ovulation takes place. In women with PCOS, the ovary does not make all of the hormones it needs for any of the eggs to fully mature. They may start to grow and accumulate fluid, but no one egg becomes large enough. Instead, some may remain as cysts. Since no egg matures or is released, ovulation does not occur and the hormone progesterone is not made. Without progesterone, a woman's menstrual cycle is irregular or absent. Also, the cysts produce female hormones, which continue to prevent ovulation.   Document Released: 10/05/2004 Document Revised: 09/03/2011 Document Reviewed: 04/29/2009  [employer]® Patient Information ©2014 [employer].

## 2013-03-20 NOTE — Progress Notes (Deleted)
  27 y.o.SingleAfrican Americanfemale presents with no menses sinceFebruary {NUMBERS 1-31 (DATE):31396}  Currently periods are occurring every 3-6 months .   Bleeding is moderate.  Periods were not regular in the past, occurring every several months. Patient reportsno weight loss, fever, night sweats and feels well . Reports no method, compliancenot applicable,  Urine pregnancynot done.  Patientdenies having been diagnosed with metabolic syndrome,reportspolycystic ovaries.   A comprehensive review of systems was negative. BP 130/88  Pulse 88  Ht 5\' 5"  (1.651 m)  Wt 292 lb (132.45 kg)  BMI 48.59 kg/m2   27 y.o.Single{Race/ethnicity:17218}female presents with no menses since{MONTH:10108} {NUMBERS 1-31 (DATE):31396}  Currently periods are occurring every {numbers; 0-10:33138} {time frame:11::"days"}.   Bleeding is {vaginal bleeding:11356}.  Periods {were/not:15343} regular in the past, occurring every {numbers; 0-10:33138} {time frame:11::"days"}. Patient reports{Ros - constitutional:32435} . Reports {PLAN CONTRACEPTION:313102}, compliance{yes/no:63},  Urine pregnancy{Pos/neg/not done:15184}.  Patient{Actions; denies/admits to:5300} having been diagnosed with metabolic syndrome,{Actions; denies/admits to:5300}polycystic ovaries.   {Ros - complete:30496} {exam, Complete:18323}  Assessment:  Amenorrhea*** Plan: Discussed with patient factors that may be contributory to menstrual abnormalities include {amenorrhea contributing factors:11389}. Previous treatments for menstrual abnormalities include {amenorrhea treatments:11392}.  Labs:*** ****                                                                Assessment:  Amenorrhea most likely PCOS as cause Plan: Discussed with patient factors that may be contributory to menstrual abnormalities include {amenorrhea contributing factors:11389}. Previous treatments for menstrual abnormalities include {amenorrhea treatments:11392}.   Labs:*** ****
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
## 2013-03-22 ENCOUNTER — Telehealth: Payer: Self-pay | Admitting: Nurse Practitioner

## 2013-03-22 NOTE — Telephone Encounter (Signed)
Patient was called to get a home UPT to confirm pregnancy status as this was not done on Friday - she will call us back on Monday am with test results.

## 2013-03-23 ENCOUNTER — Telehealth: Payer: Self-pay | Admitting: Nurse Practitioner

## 2013-03-23 NOTE — Telephone Encounter (Signed)
PC to patient.  Pt states she has taken home pregnancy test and it was negative.

## 2013-03-23 NOTE — Telephone Encounter (Signed)
LMTCB to discuss ins benefits for PUS/IUD Insertion and to schedule with BS.

## 2013-03-24 NOTE — Progress Notes (Signed)
Reviewed personally.  M. Suzanne Geena Weinhold, MD.  

## 2013-03-26 NOTE — Telephone Encounter (Signed)
LMTCB to discuss ins benefits and schedule PUS.  °

## 2013-04-02 NOTE — Telephone Encounter (Signed)
LMTCB to schedule PUS and discuss ins benefits.

## 2013-04-08 ENCOUNTER — Encounter: Payer: Self-pay | Admitting: Nurse Practitioner

## 2013-05-05 ENCOUNTER — Telehealth: Payer: Self-pay | Admitting: Obstetrics & Gynecology

## 2013-05-05 NOTE — Telephone Encounter (Signed)
Three phone calls have been made and letter sent by [facility] in attempt to schedule pt's ultrasound recommended due to amenorrhea, probable PCOS, and desirous of Mirena IUD placement.  Pt has not responded.  No additional attempts to schedule this will be made.

## 2013-07-14 ENCOUNTER — Ambulatory Visit (INDEPENDENT_AMBULATORY_CARE_PROVIDER_SITE_OTHER): Payer: 59 | Admitting: Emergency Medicine

## 2013-07-14 VITALS — BP 132/90 | HR 97 | Temp 98.3°F | Resp 16 | Ht 66.0 in | Wt 313.0 lb

## 2013-07-14 DIAGNOSIS — E282 Polycystic ovarian syndrome: Secondary | ICD-10-CM

## 2013-07-14 DIAGNOSIS — J209 Acute bronchitis, unspecified: Secondary | ICD-10-CM

## 2013-07-14 DIAGNOSIS — J018 Other acute sinusitis: Secondary | ICD-10-CM

## 2013-07-14 LAB — POCT URINE PREGNANCY: Preg Test, Ur: NEGATIVE

## 2013-07-14 MED ORDER — AMOXICILLIN-POT CLAVULANATE 875-125 MG PO TABS
1.0000 | ORAL_TABLET | Freq: Two times a day (BID) | ORAL | Status: DC
Start: 2013-07-14 — End: 2013-07-27

## 2013-07-14 MED ORDER — PSEUDOEPHEDRINE-GUAIFENESIN ER 60-600 MG PO TB12
1.0000 | ORAL_TABLET | Freq: Two times a day (BID) | ORAL | Status: DC
Start: 2013-07-14 — End: 2013-07-27

## 2013-07-14 NOTE — Progress Notes (Signed)
[facility] [address], [place] [id number] [phone]  Date:  07/14/2013   Name:  Diane Padilla   DOB:  Oct 02, 1985   MRN:  [id number]  PCP:  No PCP Per Patient    Chief Complaint: Possible Pregnancy, Chest Congestion, Cough and Sore Throat   History of Present Illness:  Diane Padilla is a 28 y.o. very pleasant female patient who presents with the following:  Ill for a week with nasal congestion, postnasal drainage and a sore throat.  Says her mucous is yellowish brown.  Has a cough productive of purulent sputum.  No wheezing or shortness of breath.  Feels fatigued over the past several days.  No fever or chills. Concerned that she may be pregnant and requests a UCG.  Has PCOS and is not on contraceptive.  LMP December.  No improvement with over the counter medications or other home remedies. Denies other complaint or health concern today.   Patient Active Problem List   Diagnosis Date Noted  . PCOS (polycystic ovarian syndrome) 01/09/2013  . Oligomenorrhea 12/12/2012    Past Medical History  Diagnosis Date  . Allergy     History reviewed. No pertinent past surgical history.  History  Substance Use Topics  . Smoking status: Never Smoker   . Smokeless tobacco: Never Used  . Alcohol Use: Yes     Comment: rare if ever    Family History  Problem Relation Age of Onset  . Diabetes Mother   . Hypertension Mother   . Hypertension Maternal Grandmother   . Hyperlipidemia Maternal Grandmother     No Known Allergies  Medication list has been reviewed and updated.  Current Outpatient Prescriptions on File Prior to Visit  Medication Sig Dispense Refill  . medroxyPROGESTERone (PROVERA) 10 MG tablet Take 1 tablet (10 mg total) by mouth daily.  10 tablet  0   No current facility-administered medications on file prior to visit.    Review of Systems:  As per HPI, otherwise negative.    Physical Examination: Filed Vitals:   07/14/13  1413  BP: 132/90  Pulse: 97  Temp: 98.3 F (36.8 C)  Resp: 16   Filed Vitals:   07/14/13 1413  Height: 5\' 6"  (1.676 m)  Weight: 313 lb (141.976 kg)   Body mass index is 50.54 kg/(m^2). Ideal Body Weight: Weight in (lb) to have BMI = 25: 154.6  GEN: morbid obesity, NAD, Non-toxic, A & O x 3 HEENT: Atraumatic, Normocephalic. Neck supple. No masses, No LAD. Ears and Nose: No external deformity. CV: RRR, No M/G/R. No JVD. No thrill. No extra heart sounds. PULM: CTA B, no wheezes, crackles, rhonchi. No retractions. No resp. distress. No accessory muscle use. ABD: S, NT, ND, +BS. No rebound. No HSM. EXTR: No c/c/e NEURO Normal gait.  PSYCH: Normally interactive. Conversant. Not depressed or anxious appearing.  Calm demeanor.    Assessment and Plan: Sinusitis Bronchitis Augmentin Mucinex d  Signed,  Phillips OdorJeffery Kyndal Heringer, MD   Results for orders placed in visit on 07/14/13  POCT URINE PREGNANCY      Result Value Range   Preg Test, Ur Negative

## 2013-07-14 NOTE — Patient Instructions (Signed)

## 2013-07-20 DIAGNOSIS — E119 Type 2 diabetes mellitus without complications: Secondary | ICD-10-CM

## 2013-07-20 HISTORY — DX: Type 2 diabetes mellitus without complications: E11.9

## 2013-07-27 ENCOUNTER — Ambulatory Visit (INDEPENDENT_AMBULATORY_CARE_PROVIDER_SITE_OTHER): Payer: 59 | Admitting: Nurse Practitioner

## 2013-07-27 ENCOUNTER — Telehealth: Payer: Self-pay | Admitting: Nurse Practitioner

## 2013-07-27 ENCOUNTER — Encounter: Payer: Self-pay | Admitting: Nurse Practitioner

## 2013-07-27 VITALS — BP 120/72 | HR 64 | Ht 66.0 in | Wt 311.0 lb

## 2013-07-27 DIAGNOSIS — R102 Pelvic and perineal pain: Secondary | ICD-10-CM

## 2013-07-27 DIAGNOSIS — N949 Unspecified condition associated with female genital organs and menstrual cycle: Secondary | ICD-10-CM

## 2013-07-27 DIAGNOSIS — N939 Abnormal uterine and vaginal bleeding, unspecified: Secondary | ICD-10-CM

## 2013-07-27 DIAGNOSIS — Z113 Encounter for screening for infections with a predominantly sexual mode of transmission: Secondary | ICD-10-CM

## 2013-07-27 DIAGNOSIS — N926 Irregular menstruation, unspecified: Secondary | ICD-10-CM

## 2013-07-27 DIAGNOSIS — B373 Candidiasis of vulva and vagina: Secondary | ICD-10-CM

## 2013-07-27 DIAGNOSIS — B3731 Acute candidiasis of vulva and vagina: Secondary | ICD-10-CM

## 2013-07-27 LAB — CBC
HCT: 39.2 % (ref 36.0–46.0)
HEMOGLOBIN: 13.6 g/dL (ref 12.0–15.0)
MCH: 28.9 pg (ref 26.0–34.0)
MCHC: 34.7 g/dL (ref 30.0–36.0)
MCV: 83.2 fL (ref 78.0–100.0)
Platelets: 447 10*3/uL — ABNORMAL HIGH (ref 150–400)
RBC: 4.71 MIL/uL (ref 3.87–5.11)
RDW: 13.6 % (ref 11.5–15.5)
WBC: 9.4 10*3/uL (ref 4.0–10.5)

## 2013-07-27 MED ORDER — MEDROXYPROGESTERONE ACETATE 10 MG PO TABS: 10.0000 mg | ORAL_TABLET | Freq: Every day | ORAL | Status: DC

## 2013-07-27 MED ORDER — FLUCONAZOLE 150 MG PO TABS
150.0000 mg | ORAL_TABLET | Freq: Once | ORAL | Status: DC
Start: 2013-07-27 — End: 2014-04-12

## 2013-07-27 NOTE — Telephone Encounter (Signed)
Pt is having some vaginal irritation and would like to see Patty this week.

## 2013-07-27 NOTE — Progress Notes (Signed)
Subjective:     Patient ID: Diane Padilla, female   DOB: Oct 08, 1985, 28 y.o.   MRN: [id number]  HPI This 28 yo G0,SAA Fe complains of vaginal itching after completion of Augmentin for sinusitis and bronchitis.  States that vulvar itching has gotten worse this week after her last dose of antibiotics.  She has a history of oligomenorrhea and PCOS.  Several times in the past she has been advised to take Provera for amenorrhea and has always declined or never got the RX.  She has now started a cycle on her own about 04/25/13 and continues to bleed daily.  She now feels tired.  PMP 09/2012.  She is now dating and sexually active.  Not using any method of birth control.  In the past she has declined birth control for cycle regulation.   Review of Systems  Constitutional: Positive for fatigue.  Respiratory: Negative.   Cardiovascular: Negative.   Gastrointestinal: Negative.   Genitourinary: Positive for vaginal bleeding, vaginal discharge, vaginal pain, menstrual problem, pelvic pain and dyspareunia. Negative for dysuria, urgency, frequency and flank pain.  Musculoskeletal: Negative.   Skin: Negative.   Neurological: Negative.   Psychiatric/Behavioral: Negative.        Objective:   Physical Exam  Constitutional: She is oriented to person, place, and time. She appears well-developed and well-nourished. No distress.  Pulmonary/Chest: Effort normal.  Abdominal: Soft. She exhibits no distension. There is no tenderness. There is no rebound and no guarding.  Genitourinary:  Moderate amount of vaginal bleeding with small clots in the vault. Wet prep is not done.  On bimanual she has pain with touch of her cervix and uterus but not particularly with motion. States she always has pain with SA.  Neurological: She is alert and oriented to person, place, and time.  Skin: Skin is warm and dry.  Psychiatric: She has a normal mood and affect. Her behavior is normal. Judgment and thought content normal.       Assessment:     Most likely Yeast vaginitis after completion of antibiotics AUB since 04/25/13 with history of PCOS; PMP 4/ 2014 R/O pregnancy as cause of AUB Pelvic pain with SA, R/O STD's    Plan:     Treat with Diflucan 150 mg X 2 doses Will get STD's and serum pregnancy test and follow If not pregnant she will take Provera 10 mg X 10 days - then call back with report of bleeding Discussed with Dr. Edward JollySilva about PUS/SHGM and decided to wait and see if pelvic pain improves with starting OCP and getting cycle regulation. If STD's will treat as that may the cause of pain. Will follow with labs

## 2013-07-27 NOTE — Patient Instructions (Signed)

## 2013-07-27 NOTE — Telephone Encounter (Signed)
Patient requests OV with Patty for vaginal irritation and not sure what is causing it.  She acknowledges vaginal discharge but states it is "kinda like cycle but i am definitely irritated."  OV scheduled today at 215 with Patty.  Routing to provider for final review. Patient agreeable to disposition. Will close encounter

## 2013-07-28 ENCOUNTER — Telehealth: Payer: Self-pay | Admitting: Nurse Practitioner

## 2013-07-28 LAB — STD PANEL
HEP B S AG: NEGATIVE
HIV: NONREACTIVE

## 2013-07-28 LAB — HEMOGLOBIN, FINGERSTICK: HEMOGLOBIN, FINGERSTICK: 13.6 g/dL (ref 12.0–16.0)

## 2013-07-28 LAB — HCG, SERUM, QUALITATIVE: Preg, Serum: NEGATIVE

## 2013-07-28 NOTE — Telephone Encounter (Signed)
Patient is informed about negative pregnancy test results and is to start on Provera to stop this AUB for now.  She is to expect a withdrawal bleed and then call us to start on OCP.  Per Dr. Edward [facility]Silva [facility] but wait and see how she does with OCP.  Will also call her later with STD's.

## 2013-07-29 LAB — IPS N GONORRHOEA AND CHLAMYDIA BY PCR

## 2013-07-29 NOTE — Progress Notes (Signed)
Encounter reviewed by Dr. Trentin Knappenberger Silva.  

## 2013-09-14 ENCOUNTER — Encounter: Payer: 59 | Attending: Family Medicine | Admitting: *Deleted

## 2013-09-14 ENCOUNTER — Encounter: Payer: Self-pay | Admitting: *Deleted

## 2013-09-14 VITALS — Ht 65.0 in | Wt 312.7 lb

## 2013-09-14 DIAGNOSIS — E119 Type 2 diabetes mellitus without complications: Secondary | ICD-10-CM | POA: Insufficient documentation

## 2013-09-14 DIAGNOSIS — Z713 Dietary counseling and surveillance: Secondary | ICD-10-CM | POA: Insufficient documentation

## 2013-09-14 NOTE — Progress Notes (Signed)
Medical Nutrition Therapy:  Appt start time: 0830 end time:  0930.  Assessment:  Patient here today for type 2 diabetes. Patient newly diagnosed with diabetes this January with an A1c of 6.7. She is wanting to work on diet and exercise modifications with weight loss to manage glucose before starting medications. She has already made changes, including limiting fried foods, reducing portion size, increasing vegetables, and decreasing the use of fats in cooking. She has also been exercising most days for the last 3 weeks. She reports a rapid weight gain of around 50 pounds in the last year. She has a family history of diabetes on both sides of her family. She has a prescription for a glucose meter, but has not picked it up yet. She does feel overwhelmed about knowing how much carb to eat.    MEDICATIONS: Reviewed   DIETARY INTAKE:   Usual eating pattern includes 3 meals and 3-4 snacks per day.  24-hr recall:  B ( AM): Protein shake (Glucerna) OR grits, boiled/fried egg, bacon, water  Snk ( AM): Yogurt, fruit (apple, tangerine, grapes), almonds, string cheese, granola bar L ( PM): [place] sandwich (wheat bread, [place]key, cheese, lettuce, tomato, onion, avocado), chips OR salad (tuna/chicken, fruit, nuts), water Snk ( PM): Same D ( PM): Baked chicken/fish, vegetables, limited potatoes/rice, water Snk ( PM): Same Beverages: Water  Usual physical activity: 5-6 times per week, strength training, cardio at home, 30 minutes  Estimated energy needs: 1800 calories 203 g carbohydrates 113 g protein 60 g fat  Progress Towards Goal(s):  In progress.   Nutritional Diagnosis:  NB-1.1 Food and nutrition-related knowledge deficit As related to diabetes.  As evidenced by new diagnosis.    Intervention:  Nutrition counseling. We discussed basic carb counting, including foods with carbs, label reading, portion size, and meal planning. We also discussed strategies for weight loss, healthy snacking, and  balanced meals.   Goals:  1. 2-3 carb servings at meals, 1 serving at snacks 2. 1 pound weight loss per week. 3. Use plate method for meal planning 4. Choose healthy snacks 5. Continue exercising as currently  Handouts given during visit include:  Living Well with Diabetes  Meal plan card  Monitoring/Evaluation:  Dietary intake, exercise, blood glucose, and body weight in 1 month(s).

## 2013-10-30 ENCOUNTER — Ambulatory Visit: Payer: 59 | Admitting: *Deleted

## 2014-04-12 ENCOUNTER — Encounter: Payer: Self-pay | Admitting: Nurse Practitioner

## 2014-04-12 ENCOUNTER — Ambulatory Visit (INDEPENDENT_AMBULATORY_CARE_PROVIDER_SITE_OTHER): Payer: 59 | Admitting: Nurse Practitioner

## 2014-04-12 VITALS — BP 130/84 | HR 84 | Ht 66.0 in | Wt 299.0 lb

## 2014-04-12 DIAGNOSIS — B373 Candidiasis of vulva and vagina: Secondary | ICD-10-CM

## 2014-04-12 DIAGNOSIS — N938 Other specified abnormal uterine and vaginal bleeding: Secondary | ICD-10-CM

## 2014-04-12 DIAGNOSIS — B3731 Acute candidiasis of vulva and vagina: Secondary | ICD-10-CM

## 2014-04-12 MED ORDER — NYSTATIN-TRIAMCINOLONE 100000-0.1 UNIT/GM-% EX OINT: 1.0000 "application " | TOPICAL_OINTMENT | Freq: Two times a day (BID) | CUTANEOUS | Status: DC

## 2014-04-12 MED ORDER — FLUCONAZOLE 150 MG PO TABS: 150.0000 mg | ORAL_TABLET | Freq: Once | ORAL | Status: DC

## 2014-04-12 MED ORDER — MEDROXYPROGESTERONE ACETATE 10 MG PO TABS: 10.0000 mg | ORAL_TABLET | Freq: Every day | ORAL | Status: DC

## 2014-04-12 NOTE — Progress Notes (Signed)
28 y.o.Single African American female G0P0 with a 3 day(s) history of the following:local irritation and vulvar itching Sexually active: Yes.   Last sexual activity:4 months ago.  She  reports no associated urinary symptoms. Patient has not tried over the counter treatment.  She has history of PCOS and PMP was in October 2014.  She then started a cycle first of February.  Still having vaginal bleeding since then.  Some times about monthly she will have a very heavy flow that seems like a normal menses.  But continues to bleed daily requiring a pad.  No PMS symptoms.  She feels the pad wear is causing her external vulvar irritation.     Exam:  NFA:OZHYQMVHExt:multiple skin tears and thickening of tissue consistent with chronic yeast   Vagina: moderate menses flow - wet prep not obtained.                Cx:  normal appearance                Uterus:normal size                Adnexa: no mass, fullness, tenderness  Wet Prep shows:not done secondary to menses   Dx: Yeast vaginitis  DUB since February  History of PCOS   TX::Symptomatic local care discussed. Oral antifungal see orders.  Diflucan 150 mg X 2  Nystatin cream BID    History of PCOS  DUB since February  Will discuss care with Dr. Edward JollySilva Advised patient we will get PUS and follow

## 2014-04-12 NOTE — Patient Instructions (Signed)

## 2014-04-14 ENCOUNTER — Telehealth: Payer: Self-pay | Admitting: Nurse Practitioner

## 2014-04-14 NOTE — Telephone Encounter (Signed)
Left message for patient to call back. Need to go over benefits and schedule PUS. °Pr $25 °

## 2014-04-15 NOTE — Telephone Encounter (Signed)
Spoke with patient. Advised that per benefit quote received, she will be responsible for $25 copay when she comes in for PUS. Patient agreeable. °Scheduled PUS. °Advised patient of 72 hour cancellation policy and $100 cancellation fee. Patient agreeable. °

## 2014-04-15 NOTE — Progress Notes (Signed)
Encounter reviewed by Dr. Conley SimmondsBrook Silva. I recommend pelvic ultrasound and blood work for evaluation of abnormal uterine bleeding at that visit.

## 2014-04-26 ENCOUNTER — Telehealth: Payer: Self-pay | Admitting: Obstetrics & Gynecology

## 2014-04-26 NOTE — Telephone Encounter (Signed)
Patient called and cancelled her ultrasound for 04/29/14. Please call her to reschedule.

## 2014-04-27 NOTE — Telephone Encounter (Signed)
Left message for patient to call back. Need to reschedule PUS °

## 2014-04-29 ENCOUNTER — Other Ambulatory Visit: Payer: 59 | Admitting: Obstetrics & Gynecology

## 2014-04-29 ENCOUNTER — Other Ambulatory Visit: Payer: 59

## 2014-05-12 NOTE — Telephone Encounter (Signed)
Spoke with patient. Patient states that she was seen for yeast infection and treated with diflucan. Patient took one diflucan waited 48 hours and took another. States that one week later symptoms returned. Patient had a refill on diflucan and took both pills again. States that this helped for a little while but is experiencing some "irritation" and would like to come back in to be seen. Requesting appointment for 11/20 with Lauro FranklinPatricia Rolen-Grubb, FNP. Appointment scheduled for 11/20 at 2:15pm. Patient is agreeable to date and time.  Routing to provider for final review. Patient agreeable to disposition. Will close encounter

## 2014-05-12 NOTE — Telephone Encounter (Signed)
Pt says she is still having some problems and would like to come back in for a recheck.

## 2014-05-14 ENCOUNTER — Encounter: Payer: Self-pay | Admitting: Nurse Practitioner

## 2014-05-14 ENCOUNTER — Ambulatory Visit (INDEPENDENT_AMBULATORY_CARE_PROVIDER_SITE_OTHER): Payer: 59 | Admitting: Nurse Practitioner

## 2014-05-14 VITALS — BP 126/82 | HR 76 | Ht 66.0 in | Wt 301.0 lb

## 2014-05-14 DIAGNOSIS — B3731 Acute candidiasis of vulva and vagina: Secondary | ICD-10-CM

## 2014-05-14 DIAGNOSIS — B373 Candidiasis of vulva and vagina: Secondary | ICD-10-CM

## 2014-05-14 MED ORDER — TERCONAZOLE 0.4 % VA CREA: 1.0000 | TOPICAL_CREAM | Freq: Every day | VAGINAL | Status: DC

## 2014-05-14 NOTE — Progress Notes (Signed)
28 y.o. SAA Fe G0 here with complaint of vaginal symptoms of itching, burning, irritation and increase discharge. Describes discharge as white and  thick. Onset of symptoms 10 days ago. Denies new personal products or vaginal dryness.     She has started exercising and aerobics this past week and does try to change to dry underwear.  No STD concerns.  No urinary symptoms.  Her LMP started first of February and just ended about 9 days ago.  She was going to get Provera as instructed at last OV 04/12/14 - but menses stopped on its own.  She is still using condoms for birth control.   O: Healthy female WDWN  Affect: normal, orientation x 3  Exam: Abdomen: soft and non tender Lymph node: no enlargement or tenderness Pelvic exam: External genital:  BUS: negative Vagina: thick cottage cheezy discharge noted. Wet prep taken Cervix: normal, non tender Uterus: normal, non tender   Wet Prep results: PH: 3.5; NSS: negative; KOH: + yeast   A:   Yeast vaginitis  History of AUB   P: Discussed findings of yeast and etiology. Discussed Aveeno or baking soda sitz bath for comfort. Avoid moist clothes or pads for extended period of time. If working out in gym clothes or swim suits for long periods of time change underwear or bottoms of swimsuit if possible. Olive Oil use for skin protection prior to activity can be used to external skin.  Rx: Terazol vaginal cream HS X 7  She is advised if no menses in 3 months to CB  She is advised if prolonged menses to call after a reasonable time of 8-10 days - not a month or more.  RV prn

## 2014-05-14 NOTE — Patient Instructions (Signed)

## 2014-05-16 NOTE — Progress Notes (Signed)
Encounter reviewed by Dr. Brook Silva.  

## 2014-08-18 ENCOUNTER — Ambulatory Visit (INDEPENDENT_AMBULATORY_CARE_PROVIDER_SITE_OTHER): Payer: 59 | Admitting: Nurse Practitioner

## 2014-08-18 ENCOUNTER — Encounter: Payer: Self-pay | Admitting: Nurse Practitioner

## 2014-08-18 VITALS — BP 120/98 | HR 86 | Ht 66.0 in | Wt 302.0 lb

## 2014-08-18 DIAGNOSIS — N912 Amenorrhea, unspecified: Secondary | ICD-10-CM

## 2014-08-18 LAB — POCT URINE PREGNANCY: Preg Test, Ur: NEGATIVE

## 2014-08-18 NOTE — Patient Instructions (Signed)
Call me Monday if no menses

## 2014-08-18 NOTE — Progress Notes (Signed)
29 y.o.  SAA Fe GO here with complaint of vaginal symptoms and increase discharge. Describes discharge as white thick vaginal discharge.  No menses since November.  Maybe light spotting in December.  Feels that she maybe going to start cycle today.  Light spotting noted earlier today.   Same partner for 2 years, engagement is pending and they have decided Not SA for 2-3 months Onset of symptoms 2 days ago. Denies new personal products or vaginal dryness. NO STD concerns. Urinary symptoms none.   O:Healthy female WDWN Affect: normal, orientation x 3  Exam: Abdomen: soft and non tender Lymph node: no enlargement or tenderness Pelvic exam: External genital: normal BUS: negative Vagina: light pink discharge noted.  Cervix: normal, non tender Uterus: normal, non tender Adnexa:normal, non tender, no masses or fullness noted   A: Amenorrhea  Vaginal discharge - maybe onset of menses   P: Discussed findings of normal vaginal discharge and etiology related to menses.   If no menses by Monday to call back as we will need to give her Provera challenge.  RV prn

## 2014-08-19 NOTE — Progress Notes (Signed)
Encounter reviewed by Dr. Kym Scannell Silva.  

## 2014-08-20 ENCOUNTER — Ambulatory Visit: Payer: Self-pay | Admitting: Certified Nurse Midwife

## 2014-09-10 ENCOUNTER — Ambulatory Visit: Payer: 59 | Admitting: Nurse Practitioner

## 2014-11-16 ENCOUNTER — Encounter: Payer: Self-pay | Admitting: Nurse Practitioner

## 2014-11-16 ENCOUNTER — Ambulatory Visit (INDEPENDENT_AMBULATORY_CARE_PROVIDER_SITE_OTHER): Payer: 59 | Admitting: Nurse Practitioner

## 2014-11-16 ENCOUNTER — Telehealth: Payer: Self-pay

## 2014-11-16 VITALS — BP 112/78 | HR 76 | Ht 65.25 in | Wt 299.5 lb

## 2014-11-16 DIAGNOSIS — Z0000 Encounter for general adult medical examination without abnormal findings: Secondary | ICD-10-CM | POA: Diagnosis not present

## 2014-11-16 DIAGNOSIS — N926 Irregular menstruation, unspecified: Secondary | ICD-10-CM | POA: Diagnosis not present

## 2014-11-16 DIAGNOSIS — Z01419 Encounter for gynecological examination (general) (routine) without abnormal findings: Secondary | ICD-10-CM

## 2014-11-16 LAB — HCG, QUANTITATIVE, PREGNANCY: hCG, Beta Chain, Quant, S: <1 m[IU]/mL

## 2014-11-16 LAB — POCT URINALYSIS DIPSTICK
Bilirubin, UA: NEGATIVE
Glucose, UA: NEGATIVE
KETONES UA: NEGATIVE
LEUKOCYTES UA: NEGATIVE
NITRITE UA: NEGATIVE
PH UA: 5
Protein, UA: NEGATIVE
UROBILINOGEN UA: NEGATIVE

## 2014-11-16 LAB — POCT URINE PREGNANCY: Preg Test, Ur: NEGATIVE

## 2014-11-16 MED ORDER — MEDROXYPROGESTERONE ACETATE 10 MG PO TABS: 10.0000 mg | ORAL_TABLET | Freq: Every day | ORAL | Status: DC

## 2014-11-16 NOTE — Telephone Encounter (Signed)
Called patient at [phone] and spoke with patient and advised Quant. HCG was negative and can begin Provera.  Patient acknowledged verbal understanding.

## 2014-11-16 NOTE — Patient Instructions (Signed)

## 2014-11-16 NOTE — Progress Notes (Signed)
29 y.o. G0P0 Single  African American Fe here for annual exam.  She has suspected history of PCOS.  Most recently a lot of irregular menses.  PMP was spotting in December.  Then no menses until 09/13/14 that has continued till present.   Initially had a heavier flow then light flow, heavier again about mid April, then light flow and again in May heavier flow, now back to light flow.  Not SA since early this year.  Same partner waiting to get married before being SA again.  Bu no plans for a wedding.    Has been on Glucophage and lost some weight , blood sugars are normal and may consider coming off med's in August after seeing PCP.    2015 she had a very long period from first of February - November 10/15.    2014  menses 04/25/13 - 2 /2015 She did not take Provera as instructed last year.  Normal menses on her own is every 3-6 months.  No method of birth control.  No interest in OCP - but did consider Mirena at one point. In past have tried to schedule PUS and IUD discussion and that always has ended in no return calls.  Patient's last menstrual period was 09/13/2014.          Sexually active: Yes.   female The current method of family planning is none.    Exercising: No.  none. Smoker:  no  Health Maintenance: Pap:  02/16/12 wnl  TDaP:  2010 Labs: PCP   - Dr. Vyvyan Sun, MD Urine Dip:  Mod RBCs Urine UPT:  Neg   reports that she has never smoked. She has never used smokeless tobacco. She reports that she drinks alcohol. She reports that she does not use illicit drugs.  Past Medical History  Diagnosis Date  . Allergy   . Diabetes mellitus without complication 07/20/13    no meds at this time    History reviewed. No pertinent past surgical history.  Current Outpatient Prescriptions  Medication Sig Dispense Refill  . Blood Glucose Monitoring Suppl (ONE TOUCH ULTRA MINI) W/DEVICE KIT     . medroxyPROGESTERone (PROVERA) 10 MG tablet Take 1 tablet (10 mg total) by mouth daily. 10 tablet 0  .  metFORMIN (GLUCOPHAGE-XR) 500 MG 24 hr tablet Take 1 tablet by mouth at bedtime.     No current facility-administered medications for this visit.    Family History  Problem Relation Age of Onset  . Diabetes Mother   . Hypertension Mother   . Hypertension Maternal Grandmother   . Hyperlipidemia Maternal Grandmother     ROS:  Pertinent items are noted in HPI.  Otherwise, a comprehensive ROS was negative.  Exam:   BP 112/78 mmHg  Pulse 76  Ht 5' 5.25" (1.657 m)  Wt 299 lb 8 oz (135.852 kg)  BMI 49.48 kg/m2  LMP 09/13/2014 Height: 5' 5.25" (165.7 cm) Ht Readings from Last 3 Encounters:  11/16/14 5' 5.25" (1.657 m)  08/18/14 5' 6" (1.676 m)  05/14/14 5' 6" (1.676 m)    General appearance: alert, cooperative and appears stated age Head: Normocephalic, without obvious abnormality, atraumatic Neck: no adenopathy, supple, symmetrical, trachea midline and thyroid normal to inspection and palpation Lungs: clear to auscultation bilaterally Breasts: normal appearance, no masses or tenderness Heart: regular rate and rhythm Abdomen: soft, non-tender; no masses,  no organomegaly Extremities: extremities normal, atraumatic, no cyanosis or edema Skin: Skin color, texture, turgor normal. No rashes or lesions Lymph   nodes: Cervical, supraclavicular, and axillary nodes normal. No abnormal inguinal nodes palpated Neurologic: Grossly normal   Pelvic: External genitalia:  no lesions              Urethra:  normal appearing urethra with no masses, tenderness or lesions              Bartholin's and Skene's: normal                 Vagina: normal appearing vagina with normal color and moderate vaginal bleeding, no lesions              Cervix: anteverted              Pap taken: Yes.   Bimanual Exam:  Uterus:  normal size, contour, position, consistency, mobility, non-tender              Adnexa: no mass, fullness, tenderness               Rectovaginal: Confirms               Anus:  normal  sphincter tone, no lesions  Chaperone present: Yes  A:  Well Woman with normal exam  History of irregular menses  Suspected PCOS  Current AUB  P:   Reviewed health and wellness pertinent to exam  Pap smear as above  Will get serum HCG and if negative will get Provera 10 mg for 10 days to stop current bleeding  Counseled with options to control AUB and she again declines.  Counseled on breast self exam, STD prevention, HIV risk factors and prevention, adequate intake of calcium and vitamin D, diet and exercise return annually or prn  An After Visit Summary was printed and given to the patient.    

## 2014-11-18 LAB — IPS PAP TEST WITH HPV

## 2014-11-19 NOTE — Progress Notes (Signed)
Encounter reviewed by Dr. Conley SimmondsBrook Silva.  I recommend the patient return for physician consultation and labs.   She is at risk for endometrial hyperplasia and consequences of irregular menses.

## 2014-12-01 ENCOUNTER — Telehealth: Payer: Self-pay | Admitting: Nurse Practitioner

## 2014-12-01 NOTE — Telephone Encounter (Signed)
Patient state that Rx mdroxy has been giving her headache and she thinks it might be interfering with her diabetes. Pateint thinks she may need to medication. Patient only has 4 pills left. Patient ok for call back

## 2014-12-01 NOTE — Telephone Encounter (Signed)
Please have her to change to Prometrium 200 mg for 5 more days.  She is to call with response to the challenge.  Then Dr. Edward JollySilva wants to have a consultation with her as to next step to prevent this from happening again.  You can go ahead and make that consult.

## 2014-12-01 NOTE — Telephone Encounter (Signed)
Spoke with patient. Patient started taking Provera 10mg  on 11/26/2014. Patient states since starting she has been having headaches daily and her glucose levels have been more up and down than normal. Patient has 4 days left to complete of Provera challenge and would like to know if she can stop or if she needs alternative medication. Advised I will speak with Lauro FranklinPatricia Rolen-Grubb, FNP regarding stopping medication and return call with further recommendations. Patient is agreeable.

## 2014-12-02 NOTE — Telephone Encounter (Signed)
Spoke with patient. Advised of message as seen below from Lauro Franklin, FNP. Patient is agreeable. Rx for Prometrium 200 mg #5 0 RF sent to pharmacy on file. Patient will call with bleeding or no bleeding after finishing course of medication. Aware may take up to two weeks after completion to have any bleeding. Patient would like to wait until return call with update on bleeding to schedule appointment with Dr.Silva.   Routing to provider for final review. Patient agreeable to disposition. Will close encounter.

## 2014-12-02 NOTE — Telephone Encounter (Signed)
Left message to call Diane Padilla at [phone]. 

## 2014-12-06 ENCOUNTER — Telehealth: Payer: Self-pay

## 2014-12-06 DIAGNOSIS — N939 Abnormal uterine and vaginal bleeding, unspecified: Secondary | ICD-10-CM

## 2014-12-06 DIAGNOSIS — N926 Irregular menstruation, unspecified: Secondary | ICD-10-CM

## 2014-12-06 NOTE — Telephone Encounter (Addendum)
Spoke with patient at time of incoming call. Patient calling stating she started bleeding on 6/8 after stopping the Provera 10mg  tablets due to not feeling well. Patient was to start Prometrium 200mg  for the last five days of challenge. Patient did not have rx filled as she had started bleeding. Patient states over the weekend she began to have "increased" bleeding. Changing pad/tampon 3-4 times a day. States over night she has been soaking through [employer] and now has to make sure she wakes up to change it during the night. Patient unable to describe her bleeding today as she just woke up. Advised I will speak with Lauro Franklin, FNP and return call with further recommendations. Patient is agreeable.

## 2014-12-06 NOTE — Telephone Encounter (Signed)
Spoke with patient. Advised I have spoken with Dr.Miller who recommends that patient have PUS in office for further evaluation. Patient is agreeable. Appointment scheduled for 6/21 at 12:30pm with 1pm consult with Dr.Miller. Patient is agreeable to date and time. Advised increased bleeding is withdrawal bleeding from stopping Provera 10mg  and is normal. Advised patient will need to monitor bleeding. Advised if bleeding increases to having to change pad/tampon every hour due to soaking through will need to be seen immediately for evaluation. Advised if increased bleeding occurs after office hours will need to be seen at local ER. Patient is agreeable and will monitor bleeding.  Routing to Dr.Miller for review.

## 2014-12-13 NOTE — Addendum Note (Signed)
Addended by: Joeseph Amor on: 12/13/2014 02:34 PM   Modules accepted: Orders

## 2014-12-14 ENCOUNTER — Ambulatory Visit (INDEPENDENT_AMBULATORY_CARE_PROVIDER_SITE_OTHER): Payer: 59

## 2014-12-14 ENCOUNTER — Ambulatory Visit (INDEPENDENT_AMBULATORY_CARE_PROVIDER_SITE_OTHER): Payer: 59 | Admitting: Obstetrics & Gynecology

## 2014-12-14 VITALS — BP 124/80 | Wt 300.0 lb

## 2014-12-14 DIAGNOSIS — E282 Polycystic ovarian syndrome: Secondary | ICD-10-CM | POA: Diagnosis not present

## 2014-12-14 DIAGNOSIS — N939 Abnormal uterine and vaginal bleeding, unspecified: Secondary | ICD-10-CM | POA: Diagnosis not present

## 2014-12-14 DIAGNOSIS — N915 Oligomenorrhea, unspecified: Secondary | ICD-10-CM | POA: Diagnosis not present

## 2014-12-14 DIAGNOSIS — N926 Irregular menstruation, unspecified: Secondary | ICD-10-CM

## 2014-12-14 MED ORDER — NORETHINDRONE 0.35 MG PO TABS: 1.0000 | ORAL_TABLET | Freq: Every day | ORAL | Status: DC

## 2014-12-14 NOTE — Progress Notes (Signed)
Patient ID: Diane Padilla, female   DOB: 26-Jul-1985, 29 y.o.   MRN: [id number]  29 y.o. G0 Singlefemale here for a pelvic ultrasound due to irregular bleeding.  Pt's cycles can be light and short or heavy and long.  Interval between cycles is irregular as well.  Pt not sexually active but is dating.  Waiting to get married.  Has been SA in the past.  Pt has considered Mirena in the past but here for ultrasound to discuss options at this time.  Pt does have a hx of hypertension.  Also, pt is on Glucophage.  Does have known hx of PCOS.  Patient's last menstrual period was 12/01/2014.  Sexually active:  no  Contraception: abstinence  FINDINGS: UTERUS: 7.4 x 4.1 x 4.7cm EMS: 3.25mm ADNEXA:   Left ovary 3.5 x 3.6 x 3.2cm with many small peripheral cysts/follicles   Right ovary 3.8 x 3.3 x 2.8cm with similar finding of small peripheral cysts/follicles CUL DE SAC: no free fluid  Ultrasound findings reviewed with pt.  PCOS appearance of ovaries noted.  Pt has been told she has this diagnosis.  She has many questions which were reviewed.  PCOS and relation to diabetes, metabolic syndrome, increased risk for endometrial cancer, risks of infertility, and irregular bleeding patterns all reviewed.  Etiology reviewed as well.  Information on PCOS provided to pt.  I really do feel she needs to consider being on contraception for cycle regulation.  In light of findings today, pt is okay with this.  Reviewed options including micronor, depo provera, Mirena IUD, and Nexplanon.  She would like to start micronor.  Usage reviewed.  Information on starting, side effects given.  Assessment:  DUB, PCOS, obesity, DM with recent hb a1c of 5.8  Plan: Start Micronor.  rx to pharmacy.  Follow up three months. Pt to call with any problems/concerns.  Continue metformin.  ~25 minutes spent with patient >50% of time was in face to face discussion of above.

## 2014-12-14 NOTE — Progress Notes (Deleted)
Subjective:     Patient ID: Diane Padilla, female   DOB: Feb 02, 1986, 29 y.o.   MRN: [id number]  HPI   Review of Systems     Objective:   Physical Exam     Assessment:     ***    Plan:     ***

## 2014-12-28 ENCOUNTER — Encounter: Payer: Self-pay | Admitting: Obstetrics & Gynecology

## 2015-09-12 ENCOUNTER — Other Ambulatory Visit: Payer: Self-pay | Admitting: Nurse Practitioner

## 2015-09-12 NOTE — Telephone Encounter (Signed)
Medication refill request: terazol  Last AEX:  11/16/14 PG Next AEX: 11/25/15 PG Last MMG (if hormonal medication request): none Refill authorized: please advise.

## 2015-10-14 ENCOUNTER — Encounter: Payer: Self-pay | Admitting: Nurse Practitioner

## 2015-10-14 ENCOUNTER — Ambulatory Visit (INDEPENDENT_AMBULATORY_CARE_PROVIDER_SITE_OTHER): Payer: 59 | Admitting: Nurse Practitioner

## 2015-10-14 VITALS — BP 126/84 | HR 68 | Ht 65.25 in | Wt 306.0 lb

## 2015-10-14 DIAGNOSIS — N760 Acute vaginitis: Secondary | ICD-10-CM

## 2015-10-14 MED ORDER — TERCONAZOLE 0.4 % VA CREA: TOPICAL_CREAM | VAGINAL | Status: DC

## 2015-10-14 NOTE — Progress Notes (Signed)
30 y.o. Single African American female G0P0 here with complaint of vaginal symptoms of itching, burning, and increase discharge. Describes discharge as dark brown tinged.  Same partner 3 yrs. He has no symptoms.  Not having intercourse, but having oral sex. Onset of symptoms 7 days ago.  New personal products with a change of soap while at grandmothers home.    No STD concerns. Urinary symptoms: none . Contraception is none. She is off POP for now due to heavy bleeding while on POP. LMP 4/1 lasted for 7-8 days.  heavy for 3 days.  PMP 07/2015 lasted for 2 months with a little heavier at time of menses for March.  Last HGB AIC 6.2.     O:  Healthy female WDWN Affect: normal, orientation x 3  Exam: no distress Abdomen: soft and non tender Lymph node: no enlargement or tenderness Pelvic exam: External genital: normal female BUS: negative Vagina: white to yellow thick discharge noted.  Affirm taken Adnexa:normal, non tender, no masses or fullness noted    A: Vaginitis - most likely yeast  History of PCOS, DM  Currently off POP   P: Discussed findings of vaginitis and etiology. Discussed Aveeno or baking soda sitz bath for comfort. Avoid moist clothes or pads for extended period of time. If working out in gym clothes or swim suits for long periods of time change underwear or bottoms of swimsuit if possible. Olive Oil/Coconut Oil use for skin protection prior to activity can be used to external skin.  Rx: will refill Terazol Cream  Advise restart POP to decrease the long cycles even if heavy bleeding  Follow with Affirm  RV prn

## 2015-10-14 NOTE — Patient Instructions (Signed)

## 2015-10-14 NOTE — Progress Notes (Signed)
Reviewed personally.  M. Suzanne Tieler Cournoyer, MD.  

## 2015-10-15 LAB — WET PREP BY MOLECULAR PROBE
CANDIDA SPECIES: NEGATIVE
Gardnerella vaginalis: NEGATIVE
Trichomonas vaginosis: NEGATIVE

## 2015-10-16 ENCOUNTER — Telehealth: Payer: Self-pay | Admitting: Nurse Practitioner

## 2015-10-16 NOTE — Telephone Encounter (Signed)
Pt is given a call and left a message on cell phone per DPR that wet prep was negative and most likely was having a problem with a change in soap.  She does not have to get a refill of Terazol Cream. 

## 2015-10-16 NOTE — Telephone Encounter (Deleted)
Pt is given a call and left a message on cell phone per DPR that wet prep was negative and most likely was having a problem with a change in soap.  She does not have to get a refill of Terazol Cream.

## 2015-11-25 ENCOUNTER — Ambulatory Visit: Payer: 59 | Admitting: Nurse Practitioner

## 2015-12-14 ENCOUNTER — Other Ambulatory Visit: Payer: Self-pay | Admitting: Nurse Practitioner

## 2015-12-14 NOTE — Telephone Encounter (Signed)
Spoke with patient, she states that she did request prescription because she thinks she may have a yeast infection. Offered her an appt for this week with PG. Patient states that she is unable to come this week and Friday of next week is the only available time she is able to be seen. Patient has been placed on schedule for 6/30 with PG.

## 2015-12-14 NOTE — Telephone Encounter (Signed)
Medication refill request: Fluconazole 150mg   Last AEX:  11/16/14 PG Next AEX: patient cancelled appointment 11/25/15 Last MMG (if hormonal medication request): n/a Refill authorized: 09/26/12 #4tablets w/0 refills; today please advise. Patient needs appointment

## 2015-12-23 ENCOUNTER — Ambulatory Visit (INDEPENDENT_AMBULATORY_CARE_PROVIDER_SITE_OTHER): Payer: 59 | Admitting: Nurse Practitioner

## 2015-12-23 ENCOUNTER — Encounter: Payer: Self-pay | Admitting: Nurse Practitioner

## 2015-12-23 VITALS — BP 126/78 | HR 68 | Ht 65.25 in | Wt 311.0 lb

## 2015-12-23 DIAGNOSIS — N912 Amenorrhea, unspecified: Secondary | ICD-10-CM | POA: Diagnosis not present

## 2015-12-23 DIAGNOSIS — N760 Acute vaginitis: Secondary | ICD-10-CM

## 2015-12-23 LAB — POCT URINE PREGNANCY: Preg Test, Ur: NEGATIVE

## 2015-12-23 MED ORDER — MEDROXYPROGESTERONE ACETATE 10 MG PO TABS: 10.0000 mg | ORAL_TABLET | Freq: Every day | ORAL | Status: DC

## 2015-12-23 NOTE — Patient Instructions (Signed)
Take Provera daily X 10 then expect a withdrawal bleed.  If heavy and prolonged please call back.

## 2015-12-23 NOTE — Progress Notes (Signed)
Encounter reviewed by Dr. Brook Amundson C. Silva.  

## 2015-12-23 NOTE — Progress Notes (Signed)
Patient ID: Diane Padilla, female   DOB: January 21, 1986, 30 y.o.   MRN: [id number]  30 y.o. Single African American female G0P0 here with complaint of vaginal symptoms of itching, burning, and increase discharge. Describes discharge as white with light brown spotting past few days.   Onset of symptoms 14 days ago. Denies new personal products or vaginal dryness. No STD concerns. Urinary symptoms none . Contraception is nothing-  dating but not SA with penetration. LMP 09/2015  Lasting 7-8 days.  Her last HGB AIC was ~ 6.2.   O:  Healthy female WDWN Affect: normal, orientation x 3  Exam: no distress Abdomen: soft and non tender Lymph node: no enlargement or tenderness Pelvic exam: External genital: normal female BUS: negative Vagina: brown tinged discharge noted.  Affirm taken.   UPT: negative    A: R/O Vaginitis  Amenorrhea X 2 months   P: Discussed findings of vaginitis and etiology. Discussed Aveeno or baking soda sitz bath for comfort. Avoid moist clothes or pads for extended period of time. If working out in gym clothes or swim suits for long periods of time change underwear or bottoms of swimsuit if possible. Olive Oil/Coconut Oil use for skin protection prior to activity can be used to external skin.  Rx: will hold awaiting test  Follow with Affirm  RX: for Provera 10 mg X 10 days and expect withdrawal bleed, if prolonged then to CB.  RV prn

## 2015-12-24 LAB — WET PREP BY MOLECULAR PROBE
CANDIDA SPECIES: NEGATIVE
Gardnerella vaginalis: NEGATIVE
Trichomonas vaginosis: NEGATIVE

## 2016-02-17 ENCOUNTER — Ambulatory Visit: Payer: 59 | Admitting: Nurse Practitioner

## 2016-07-18 ENCOUNTER — Other Ambulatory Visit (HOSPITAL_COMMUNITY): Payer: Self-pay | Admitting: Medical

## 2016-10-26 ENCOUNTER — Ambulatory Visit: Payer: 59 | Admitting: Nurse Practitioner

## 2016-12-24 ENCOUNTER — Ambulatory Visit (INDEPENDENT_AMBULATORY_CARE_PROVIDER_SITE_OTHER): Payer: BLUE CROSS/BLUE SHIELD | Admitting: Nurse Practitioner

## 2016-12-24 ENCOUNTER — Encounter: Payer: Self-pay | Admitting: Nurse Practitioner

## 2016-12-24 VITALS — BP 120/70 | HR 70 | Resp 16 | Ht 65.5 in | Wt 318.0 lb

## 2016-12-24 DIAGNOSIS — Z01411 Encounter for gynecological examination (general) (routine) with abnormal findings: Secondary | ICD-10-CM

## 2016-12-24 DIAGNOSIS — E282 Polycystic ovarian syndrome: Secondary | ICD-10-CM

## 2016-12-24 DIAGNOSIS — N913 Primary oligomenorrhea: Secondary | ICD-10-CM | POA: Diagnosis not present

## 2016-12-24 DIAGNOSIS — N939 Abnormal uterine and vaginal bleeding, unspecified: Secondary | ICD-10-CM

## 2016-12-24 NOTE — Progress Notes (Signed)
31 y.o. G0P0000 Single  African American Fe here for annual exam.  She had irregular menses on and off the entire year. Most of the year states that the bleeding was so irregular that there was no regularity of a flow.  Some days was just spotting and other days light.  She also had not routine feeling of a menses such as back pain which is her main classic symptoms.  Then had a "normal" cycle on 12/14/16 X 7-8 days with the usual back pain.  Flow was heavy at times with clots toward the end.   Currently no vaginal discharge.  Last HGB AIC - 6.9.  Other labs done about 6 months ago and will bring copies back at time of PUS with Dr. Sabra Heck.  She believes TSH was done. Same partner X 4 yrs.  Plans to get married and not sure about family. They have already picked out rings.  They live together.  Patient's last menstrual period was 12/14/2016 (exact date).          Sexually active: Yes.    But not intercourse The current method of family planning is none.    Exercising: Yes.    walking Smoker:  no  Health Maintenance: Pap:  02-16-12 neg, 11-16-14 neg HPV HR neg History of Abnormal Pap: no Self Breast exams: oc Colonoscopy:  none TDaP:  2011 HIV neg 2015 Labs: none   reports that she has never smoked. She has never used smokeless tobacco. She reports that she does not drink alcohol or use drugs.  Past Medical History:  Diagnosis Date  . Allergy   . Diabetes mellitus without complication ([place]) 3/38/25   no meds at this time    History reviewed. No pertinent surgical history.  Current Outpatient Prescriptions  Medication Sig Dispense Refill  . Blood Glucose Monitoring Suppl (ONE TOUCH ULTRA MINI) W/DEVICE KIT     . Cholecalciferol (VITAMIN D) 2000 units CAPS Take 2 capsules by mouth daily.    . metFORMIN (GLUCOPHAGE-XR) 500 MG 24 hr tablet Take 1 tablet by mouth at bedtime.    . Multiple Vitamin (MULTIVITAMIN) tablet Take 1 tablet by mouth daily.    . ONE TOUCH ULTRA TEST test strip       No current facility-administered medications for this visit.     Family History  Problem Relation Age of Onset  . Diabetes Mother   . Hypertension Mother   . Hypertension Maternal Grandmother   . Hyperlipidemia Maternal Grandmother   . Breast cancer Cousin 30    ROS:  Pertinent items are noted in HPI.  Otherwise, a comprehensive ROS was negative.  Exam:   BP 120/70   Pulse 70   Resp 16   Ht 5' 5.5" (1.664 m)   Wt (!) 318 lb (144.2 kg)   LMP 12/14/2016 (Exact Date)   BMI 52.11 kg/m  Height: 5' 5.5" (166.4 cm) Ht Readings from Last 3 Encounters:  12/24/16 5' 5.5" (1.664 m)  12/23/15 5' 5.25" (1.657 m)  10/14/15 5' 5.25" (1.657 m)    General appearance: alert, cooperative and appears stated age Head: Normocephalic, without obvious abnormality, atraumatic Neck: no adenopathy, supple, symmetrical, trachea midline and thyroid normal to inspection and palpation Lungs: clear to auscultation bilaterally Breasts: normal appearance, no masses or tenderness Heart: regular rate and rhythm Abdomen: soft, non-tender; no masses,  no organomegaly Extremities: extremities normal, atraumatic, no cyanosis or edema Skin: Skin color, texture, turgor normal. No rashes or lesions Lymph nodes: Cervical, supraclavicular,  and axillary nodes normal. No abnormal inguinal nodes palpated Neurologic: Grossly normal   Pelvic: External genitalia:  no lesions              Urethra:  normal appearing urethra with no masses, tenderness or lesions              Bartholin's and Skene's: normal                 Vagina: normal appearing vagina with normal color and discharge, no lesions              Cervix: anteverted              Pap taken: No. Bimanual Exam:  Uterus:  normal size, contour, position, consistency, mobility, non-tender              Adnexa: no mass, fullness, tenderness               Rectovaginal: Confirms               Anus:  normal sphincter tone, no lesions  Chaperone present:  no  A:  Well Woman with normal exam  History of PCOS and oligomenorrhea  This past year AUB  Did not like POP due to heavy menses   P:   Reviewed health and wellness pertinent to exam  Pap smear: no  Will get another PUS and she may need EMB - will decide with PUS since she has been bleeding so much. (Her last PUS was 12/14/2014 and did not need EMB at that time)  Counseled on breast self exam, adequate intake of calcium and vitamin D, diet and exercise return annually or prn  An After Visit Summary was printed and given to the patient. Note sent to Dr. Sabra Heck

## 2016-12-24 NOTE — Patient Instructions (Addendum)

## 2016-12-25 NOTE — Progress Notes (Signed)
Reviewed personally.  M. Suzanne Coyt Govoni, MD.  

## 2016-12-31 ENCOUNTER — Telehealth: Payer: Self-pay | Admitting: Obstetrics & Gynecology

## 2016-12-31 NOTE — Telephone Encounter (Signed)
Patient called to cancel ultrasound appointment on 01/03/17. Patient advises she will need to call back to reschedule.  Routing to Dr Hyacinth MeekerMiller

## 2017-01-03 ENCOUNTER — Other Ambulatory Visit: Payer: BLUE CROSS/BLUE SHIELD | Admitting: Obstetrics & Gynecology

## 2017-01-03 ENCOUNTER — Other Ambulatory Visit: Payer: BLUE CROSS/BLUE SHIELD

## 2017-01-04 NOTE — Telephone Encounter (Signed)
Would you mind following up with this pt.  I do think she needs some additional evaluation based on the note from Patty at her last visit?  Thanks.

## 2017-01-10 NOTE — Telephone Encounter (Signed)
Call placed to patient to follow up and rescheduled the recommended ultrasound, in which patient previously canceled. Left voicemail requesting a return call.  Routing to Dr Hyacinth MeekerMiller  cc: Billie RuddySally Yeakley, RN

## 2017-01-11 NOTE — Telephone Encounter (Signed)
Patient returned call. Reviewed with patient need for scheduling. She stated she was unable to reschedule at the time of her call and will call back next week "when I have more time".   Routing to Dr. Hyacinth MeekerMiller for review.

## 2017-01-15 NOTE — Telephone Encounter (Signed)
Called placed to patient to follow up on scheduling recommended ultrasound. Left voicemail requesting a return call.  Routing to Dr Hyacinth MeekerMiller  cc: Billie RuddySally Yeakley, RN

## 2017-01-16 NOTE — Telephone Encounter (Signed)
Follow-up call to patient. Per ROI can leave message on cell voice mail. Voice mail message confirms "Diane Padilla."  Left message calling to follow-up regarding recommended ultrasound and get update. Left message we can assist her with other scheduling options if needed.

## 2017-01-23 DIAGNOSIS — E786 Lipoprotein deficiency: Secondary | ICD-10-CM | POA: Diagnosis not present

## 2017-01-23 DIAGNOSIS — Z0000 Encounter for general adult medical examination without abnormal findings: Secondary | ICD-10-CM | POA: Diagnosis not present

## 2017-01-23 DIAGNOSIS — E113299 Type 2 diabetes mellitus with mild nonproliferative diabetic retinopathy without macular edema, unspecified eye: Secondary | ICD-10-CM | POA: Diagnosis not present

## 2017-01-24 NOTE — Telephone Encounter (Signed)
Called placed to patient to follow up and reschedule recommended ultrasound. Left a voicemail message requesting a return call.  Routing to Dr Hyacinth MeekerMiller  cc: Billie RuddySally Yeakley, RN

## 2017-01-25 NOTE — Telephone Encounter (Signed)
Pt is aware of recommendations.  Ok to remove ultrasound order and close encounter.

## 2017-01-29 NOTE — Telephone Encounter (Signed)
Ultrasound order has been removed and encounter closed, as advised by provider

## 2017-02-19 ENCOUNTER — Emergency Department (HOSPITAL_COMMUNITY)
Admission: EM | Admit: 2017-02-19 | Discharge: 2017-02-19 | Disposition: A | Discharge status: Home / Self Care | Payer: BLUE CROSS/BLUE SHIELD | Attending: Emergency Medicine | Admitting: Emergency Medicine

## 2017-02-19 ENCOUNTER — Encounter (HOSPITAL_COMMUNITY): Payer: Self-pay | Admitting: *Deleted

## 2017-02-19 DIAGNOSIS — Z79899 Other long term (current) drug therapy: Secondary | ICD-10-CM | POA: Diagnosis not present

## 2017-02-19 DIAGNOSIS — Z7984 Long term (current) use of oral hypoglycemic drugs: Secondary | ICD-10-CM | POA: Insufficient documentation

## 2017-02-19 DIAGNOSIS — R1013 Epigastric pain: Secondary | ICD-10-CM

## 2017-02-19 DIAGNOSIS — E119 Type 2 diabetes mellitus without complications: Secondary | ICD-10-CM | POA: Insufficient documentation

## 2017-02-19 DIAGNOSIS — K2100 Gastro-esophageal reflux disease with esophagitis, without bleeding: Secondary | ICD-10-CM

## 2017-02-19 DIAGNOSIS — R112 Nausea with vomiting, unspecified: Secondary | ICD-10-CM | POA: Diagnosis not present

## 2017-02-19 DIAGNOSIS — R9431 Abnormal electrocardiogram [ECG] [EKG]: Secondary | ICD-10-CM | POA: Diagnosis not present

## 2017-02-19 HISTORY — DX: Gastro-esophageal reflux disease without esophagitis: K21.9

## 2017-02-19 HISTORY — DX: Polycystic ovarian syndrome: E28.2

## 2017-02-19 LAB — COMPREHENSIVE METABOLIC PANEL
ALT: 84 U/L — ABNORMAL HIGH (ref 14–54)
ANION GAP: 7 (ref 5–15)
AST: 125 U/L — ABNORMAL HIGH (ref 15–41)
Albumin: 3.8 g/dL (ref 3.5–5.0)
Alkaline Phosphatase: 95 U/L (ref 38–126)
BUN: 8 mg/dL (ref 6–20)
CHLORIDE: 103 mmol/L (ref 101–111)
CO2: 25 mmol/L (ref 22–32)
Calcium: 8.9 mg/dL (ref 8.9–10.3)
Creatinine, Ser: 0.83 mg/dL (ref 0.44–1.00)
GFR calc Af Amer: >60 mL/min (ref >60)
GFR calc non Af Amer: >60 mL/min (ref >60)
Glucose, Bld: 151 mg/dL — ABNORMAL HIGH (ref 65–99)
POTASSIUM: 4.8 mmol/L (ref 3.5–5.1)
SODIUM: 135 mmol/L (ref 135–145)
Total Bilirubin: 0.7 mg/dL (ref 0.3–1.2)
Total Protein: 7.7 g/dL (ref 6.5–8.1)

## 2017-02-19 LAB — CBG MONITORING, ED
Glucose-Capillary: 138 mg/dL — ABNORMAL HIGH (ref 65–99)
Glucose-Capillary: 127 mg/dL — ABNORMAL HIGH (ref 65–99)

## 2017-02-19 LAB — LIPASE, BLOOD: Lipase: 24 U/L (ref 11–51)

## 2017-02-19 MED ORDER — GI COCKTAIL ~~LOC~~
30.0000 mL | Freq: Once | ORAL | Status: AC
  Administered 2017-02-19: 30 mL via ORAL
  Filled 2017-02-19: qty 30

## 2017-02-19 MED ORDER — OMEPRAZOLE 20 MG PO CPDR: 20.0000 mg | DELAYED_RELEASE_CAPSULE | Freq: Every day | ORAL | 1 refills | Status: DC

## 2017-02-19 MED ORDER — ACETAMINOPHEN 500 MG PO TABS
1000.0000 mg | ORAL_TABLET | Freq: Once | ORAL | Status: AC
  Administered 2017-02-19: 1000 mg via ORAL
  Filled 2017-02-19: qty 2

## 2017-02-19 NOTE — Discharge Instructions (Signed)
Please call and schedule an appointment with your primary care provider (PCP) within the next 1-2 weeks.   You have been provided a prescription for omeprazole (prilosec) 20mg  daily with one refill. Take this medication for no longer than 8 weeks or as per directions of your PCP.   In addition, avoid known triggers such as spicey food, acid food, large meals, eating before bed as discussed. In addition, discuss this topic with your PCP for further advice.   If at any time you were to develop worsening of your symptoms that are uncontrollable or additional concerning symptoms such as but not limited to, nausea, chest pain, colicky abdominal pain, severe pain following meals or other concerning symptoms please call your PCP and or return to the emergency department for evaluation.   Thank you for visiting the [facility] ED.

## 2017-02-19 NOTE — ED Triage Notes (Addendum)
Per EMS, pt from home, reports epigastric pain which started this am with n/v.  Describes pain as burning pain and tightness.  Received zofran 4mg  en route with relief.  Pt is A&Ox 4.  In NAD.  Ambulatory without difficulty.

## 2017-02-19 NOTE — ED Provider Notes (Signed)
[facility] DEPT Provider Note   CSN: [id number] Arrival date & time: 02/19/17  5956   History   Chief Complaint Chief Complaint  Patient presents with  . Abdominal Pain    HPI Diane Padilla is a 31 y.o. female.  Who presents today for severe midepigastric abdominal pain that began at 6:45 AM this morning. She's a past medical history consistent with polycystic ovarian syndrome, diabetes mellitus, and unspecified allergies. She stated that beginning around 9 PM yesterday, she began to experience episodes of loose watery stool 4. She did not view the stool and was not aware if there was blood or mucus. Patient states that since waking this morning the pain was severe at 10/10, radiating upward into her chest which concerned her for a cardiac condition prompting a call to EMS. She states that the pain was not relieved with her typical dose of Zantac, and had progressively worsened initially, but was somewhat better at this time. In addition, the patient attested to ingesting Popeyes shrimp with spicy sauce the evening prior at a significant quantity. Husband at bedside added that the patient has experienced similar symptoms in the past which had been previously diagnosed as GERD. Patient denies alcohol consumption.      Past Medical History:  Diagnosis Date  . Allergy   . Diabetes mellitus without complication ([place]) 3/87/56   no meds at this time  . GERD (gastroesophageal reflux disease)   . PCOS (polycystic ovarian syndrome)     Patient Active Problem List   Diagnosis Date Noted  . PCOS (polycystic ovarian syndrome) 01/09/2013  . Oligomenorrhea 12/12/2012    History reviewed. No pertinent surgical history.  OB History    Gravida Para Term Preterm AB Living   0 0 0 0 0 0   SAB TAB Ectopic Multiple Live Births   0 0 0 0         Home Medications    Prior to Admission medications   Medication Sig Start Date End Date Taking? Authorizing Provider  Cholecalciferol  (VITAMIN D) 2000 units CAPS Take 2 capsules by mouth daily.   Yes [provider]  metFORMIN (GLUCOPHAGE-XR) 500 MG 24 hr tablet Take 1 tablet by mouth at bedtime. 10/24/14  Yes [provider]  Multiple Vitamin (MULTIVITAMIN) tablet Take 1 tablet by mouth daily.   Yes [provider]  ranitidine (ZANTAC) 150 MG tablet Take 150 mg by mouth daily as needed for heartburn.   Yes [provider]    Family History Family History  Problem Relation Age of Onset  . Diabetes Mother   . Hypertension Mother   . Hypertension Maternal Grandmother   . Hyperlipidemia Maternal Grandmother   . Breast cancer Cousin 30       chemo is done and upcoming surgery - ?BRCA Negative    Social History Social History  Substance Use Topics  . Smoking status: Never Smoker  . Smokeless tobacco: Never Used  . Alcohol use No     Allergies   Patient has no known allergies.   Review of Systems Review of Systems  Constitutional: Negative for chills and fever.  HENT: Negative for ear pain and sore throat.   Eyes: Negative for pain and visual disturbance.  Respiratory: Negative for cough and shortness of breath.   Cardiovascular: Positive for chest pain (Radiated upward from the abdomin). Negative for palpitations.  Gastrointestinal: Positive for abdominal pain, diarrhea, nausea and vomiting. Negative for abdominal distention.  Genitourinary: Negative for dysuria and  hematuria.  Musculoskeletal: Negative for arthralgias and back pain.  Skin: Negative for color change and rash.  Neurological: Negative for seizures and syncope.  All other systems reviewed and are negative.    Physical Exam Updated Vital Signs BP (!) 121/48   Pulse 88   Temp 97.8 F (36.6 C) (Oral)   Resp (!) 25   Ht _0  (1.651 m)   Wt (!) 142.9 kg (315 lb)   LMP 12/20/2016   SpO2 98%   BMI 52.42 kg/m   Physical Exam  Constitutional: She appears well-developed and well-nourished. No distress.    HENT:  Head: Normocephalic and atraumatic.  Eyes: Conjunctivae are normal.  Neck: Neck supple.  Cardiovascular: Normal rate and regular rhythm.   No murmur heard. Pulmonary/Chest: Effort normal and breath sounds normal. No respiratory distress.  Abdominal: Soft. Bowel sounds are normal. There is tenderness (Mid epigastric).  Musculoskeletal: She exhibits no edema.  Neurological: She is alert.  Skin: Skin is warm and dry.  Psychiatric: She has a normal mood and affect.  Nursing note and vitals reviewed.    ED Treatments / Results  Labs (all labs ordered are listed, but only abnormal results are displayed) Labs Reviewed  CBG MONITORING, ED - Abnormal; Notable for the following:       Result Value   Glucose-Capillary 138 (*)    All other components within normal limits  LIPASE, BLOOD  COMPREHENSIVE METABOLIC PANEL    EKG  EKG Interpretation None       Radiology No results found.  Procedures Procedures (including critical care time)  Medications Ordered in ED Medications  gi cocktail (Maalox,Lidocaine,Donnatal) (30 mLs Oral Given 02/19/17 1004)  acetaminophen (TYLENOL) tablet 1,000 mg (1,000 mg Oral Given 02/19/17 1108)     Initial Impression / Assessment and Plan / ED Course  I have reviewed the triage vital signs and the nursing notes.  Pertinent labs & imaging results that were available during my care of the patient were reviewed by me and considered in my medical decision making (see chart for details).    Please appears 31 year old female who presented with severe acute onset 10 out of 10 mid epigastric abdominal pain of 3 hours duration. She she added that due to the severity of the pain she knew she would be unable to wait for care emergency department waiting room. As such this prompted her to call EMS addition to this midepigastric pain she experienced minor substernal chest pain radiating from the midepigastric area.  9:30 AM, patient was evaluated by  M.D. appears stable at this time comfortable resting in bed patient was advised that she would be treated and evaluated for GERD as well as other more concerning pathology such as pancreatitis, MI, and hepatobiliary processes. CMP, lipase, CBG, EKG and GI cocktail ordered  10:15 AM, patient states the pain is steadily improve on a GI cocktail. Vitamin John this time we'll await resolution of the labs and symptoms prior to discharge consideration.  11:15 AM, patient evaluated as she feels significantly better, the pain has moved down into her lower abdomen. Steadily improving.  11:45 AM, patient again seems to have the pain is greatly improved, she is amenable to discharge. Patient advised to avoid spicy foods, acidic foods, large meals, as well as to follow the list given on her discharge paperwork with regard to foods to avoid. She was advised that if symptoms should worsen any time or she should develop concerning symptoms should return to ED. In addition  the patient is to call her primary care physician to establish and appointment for follow-up and long-term treatment of this condition. She was given a prescription for Prilosec 20 mg daily one refill to take for a total of 8 weeks or as per direction of her PCP. She was discharged in good condition.  Patient was seen and evaluated with Dr. Vanita Panda   Final Clinical Impressions(s) / ED Diagnoses   Final diagnoses:  Epigastric abdominal pain  Reflux esophagitis    New Prescriptions New Prescriptions   No medications on file     Kathi Ludwig, MD 02/19/17 [place]    Carmin Muskrat, MD 02/20/17 [id number]

## 2017-03-06 ENCOUNTER — Ambulatory Visit (INDEPENDENT_AMBULATORY_CARE_PROVIDER_SITE_OTHER): Payer: BLUE CROSS/BLUE SHIELD | Admitting: Obstetrics and Gynecology

## 2017-03-06 ENCOUNTER — Encounter: Payer: Self-pay | Admitting: Obstetrics and Gynecology

## 2017-03-06 VITALS — BP 124/82 | HR 84 | Resp 18 | Wt 319.0 lb

## 2017-03-06 DIAGNOSIS — L680 Hirsutism: Secondary | ICD-10-CM

## 2017-03-06 DIAGNOSIS — N926 Irregular menstruation, unspecified: Secondary | ICD-10-CM | POA: Diagnosis not present

## 2017-03-06 DIAGNOSIS — N643 Galactorrhea not associated with childbirth: Secondary | ICD-10-CM

## 2017-03-06 DIAGNOSIS — N898 Other specified noninflammatory disorders of vagina: Secondary | ICD-10-CM

## 2017-03-06 DIAGNOSIS — N644 Mastodynia: Secondary | ICD-10-CM | POA: Diagnosis not present

## 2017-03-06 DIAGNOSIS — L700 Acne vulgaris: Secondary | ICD-10-CM

## 2017-03-06 DIAGNOSIS — O926 Galactorrhea: Secondary | ICD-10-CM

## 2017-03-06 LAB — POCT URINE PREGNANCY: PREG TEST UR: NEGATIVE

## 2017-03-06 NOTE — Progress Notes (Signed)
GYNECOLOGY  VISIT   HPI: 31 y.o.   Single  African American  female   [place] with Patient's last menstrual period was 12/04/2016 (approximate).   here c/o vaginal discharge with odor X 7 days. Patient is also c/o bilateral breast pain. The patient c/o an 8-9 year h/o irritation on the skin of her breasts, she will get a little bump or irritation, like a pimple. Her primary gave her medication that was helping, not helping as much lately. She also c/o a one month h/o intermittent bilateral breast pain, not in both breasts at the same time. Not occurring every day. The pain is a sharp, quick pain that lasts for a few seconds. Occurs in the upper part of each breast. Not drinking much caffeine.  Her cycles are very irregular. Last cycle lasted for a year and ended in June. No bleeding since June. When she was bleeding it varied from spotted from spotting to heavy, but bleed every day. At the worst she was saturating a pad in 2 hours. Sexually active, no longer using penetration, just having oral sex. Waiting to get married to have intercourse.  She c/o hair growth on her chin and needs to shave, acne only on her breasts. She has had leakage from her nipples, clear.  She does have diabetes, followed by Dr Nancy Fetter. She just had lab work with her primary MD, states she had a HgbA1C and lipids at that time.  She c/o a slight increase in vaginal odor and a slight increase in vaginal d/c in the last week. The d/c is clear and watery. Slight vulvar irritation, no burning or itching.    GYNECOLOGIC HISTORY: Patient's last menstrual period was 12/04/2016 (approximate). Contraception:none  Menopausal hormone therapy: none         OB History    Gravida Para Term Preterm AB Living   0 0 0 0 0 0   SAB TAB Ectopic Multiple Live Births   0 0 0 0           Patient Active Problem List   Diagnosis Date Noted  . PCOS (polycystic ovarian syndrome) 01/09/2013  . Oligomenorrhea 12/12/2012    Past Medical  History:  Diagnosis Date  . Allergy   . Diabetes mellitus without complication ([place]) 12/22/50   no meds at this time  . GERD (gastroesophageal reflux disease)   . PCOS (polycystic ovarian syndrome)     No past surgical history on file.  Current Outpatient Prescriptions  Medication Sig Dispense Refill  . Cholecalciferol (VITAMIN D) 2000 units CAPS Take 2 capsules by mouth daily.    . metFORMIN (GLUCOPHAGE-XR) 500 MG 24 hr tablet Take 1 tablet by mouth at bedtime.    . Multiple Vitamin (MULTIVITAMIN) tablet Take 1 tablet by mouth daily.     No current facility-administered medications for this visit.      ALLERGIES: Patient has no known allergies.  Family History  Problem Relation Age of Onset  . Diabetes Mother   . Hypertension Mother   . Hypertension Maternal Grandmother   . Hyperlipidemia Maternal Grandmother   . Breast cancer Cousin 30       chemo is done and upcoming surgery - ?BRCA Negative    Social History   Social History  . Marital status: Single    Spouse name: N/A  . Number of children: N/A  . Years of education: N/A   Occupational History  . 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  Smoking status: Never Smoker  . Smokeless tobacco: Never Used  . Alcohol use No  . Drug use: No  . Sexual activity: Yes    Partners: Male    Birth control/ protection: None   Other Topics Concern  . Not on file   Social History Narrative  . No narrative on file    Review of Systems  Constitutional: Negative.   HENT: Negative.   Eyes: Negative.   Respiratory: Negative.   Cardiovascular: Negative.   Gastrointestinal: Negative.   Genitourinary:       Vaginal discharge with odor Bilateral breast pain   Musculoskeletal: Negative.   Skin: Negative.   Neurological: Negative.   Endo/Heme/Allergies: Negative.   Psychiatric/Behavioral: Negative.     PHYSICAL EXAMINATION:    BP (!) 158/90 (BP Location: Right Arm, Patient Position:  Sitting, Cuff Size: Large)   Pulse 84   Resp 18   Wt (!) 319 lb (144.7 kg)   LMP 12/04/2016 (Approximate)   BMI 53.08 kg/m     General appearance: alert, cooperative and appears stated age Neck: no adenopathy, supple, symmetrical, trachea midline and thyroid normal to inspection and palpation Breasts: normal appearance, no masses or tenderness, No nipple discharge or bleeding Abdomen: soft, non-tender; bowel sounds normal; no masses,  no organomegaly  Pelvic: External genitalia:  no lesions              Urethra:  normal appearing urethra with no masses, tenderness or lesions              Bartholins and Skenes: normal                 Vagina: normal appearing vagina with normal color and discharge, no lesions              Cervix: no cervical motion tenderness and no lesions              Bimanual Exam:  Uterus:  not appreciably enlarged, not tender              Adnexa: no mass, fullness, tenderness                Chaperone was present for exam.  Wet prep: ? clue, no trich, few wbc KOH: no yeast PH: 4.5   ASSESSMENT Abnormal uterine bleeding, she bleed for one year straight, now no bleeding since June. Not having intercourse Hirsutism Galactorrhea Mastalgia Diabetes Elevated BP without diagnosis of HTN Vaginal  discharge with odor, slides unclear Obesity, discussed weight loss     PLAN TSH, Prolactin, hirsutism labs, CBC UPT negative Return for endometrial biopsy Will likely need an ultrasound Will avoid caffeine, can try evening primrose oil and vit E, if breast pain persists for the next month, recommend imaging Send vaginitis probe Re check BP improved   An After Visit Summary was printed and given to the patient.  Over 30 minutes face to face time of which over 50% was spent in counseling.

## 2017-03-06 NOTE — Patient Instructions (Signed)
You can try evening primrose oil or vit E for breast pain   Breast Self-Awareness Breast self-awareness means being familiar with how your breasts look and feel. It involves checking your breasts regularly and reporting any changes to your health care provider. Practicing breast self-awareness is important. A change in your breasts can be a sign of a serious medical problem. Being familiar with how your breasts look and feel allows you to find any problems early, when treatment is more likely to be successful. All women should practice breast self-awareness, including women who have had breast implants. How to do a breast self-exam One way to learn what is normal for your breasts and whether your breasts are changing is to do a breast self-exam. To do a breast self-exam: Look for Changes  1. Remove all the clothing above your waist. 2. Stand in front of a mirror in a room with good lighting. 3. Put your hands on your hips. 4. Push your hands firmly downward. 5. Compare your breasts in the mirror. Look for differences between them (asymmetry), such as: ? Differences in shape. ? Differences in size. ? Puckers, dips, and bumps in one breast and not the other. 6. Look at each breast for changes in your skin, such as: ? Redness. ? Scaly areas. 7. Look for changes in your nipples, such as: ? Discharge. ? Bleeding. ? Dimpling. ? Redness. ? A change in position. Feel for Changes  Carefully feel your breasts for lumps and changes. It is best to do this while lying on your back on the floor and again while sitting or standing in the shower or tub with soapy water on your skin. Feel each breast in the following way:  Place the arm on the side of the breast you are examining above your head.  Feel your breast with the other hand.  Start in the nipple area and make  inch (2 cm) overlapping circles to feel your breast. Use the pads of your three middle fingers to do this. Apply light pressure,  then medium pressure, then firm pressure. The light pressure will allow you to feel the tissue closest to the skin. The medium pressure will allow you to feel the tissue that is a little deeper. The firm pressure will allow you to feel the tissue close to the ribs.  Continue the overlapping circles, moving downward over the breast until you feel your ribs below your breast.  Move one finger-width toward the center of the body. Continue to use the  inch (2 cm) overlapping circles to feel your breast as you move slowly up toward your collarbone.  Continue the up and down exam using all three pressures until you reach your armpit.  Write Down What You Find  Write down what is normal for each breast and any changes that you find. Keep a written record with breast changes or normal findings for each breast. By writing this information down, you do not need to depend only on memory for size, tenderness, or location. Write down where you are in your menstrual cycle, if you are still menstruating. If you are having trouble noticing differences in your breasts, do not get discouraged. With time you will become more familiar with the variations in your breasts and more comfortable with the exam. How often should I examine my breasts? Examine your breasts every month. If you are breastfeeding, the best time to examine your breasts is after a feeding or after using a breast pump. If  you menstruate, the best time to examine your breasts is 5-7 days after your period is over. During your period, your breasts are lumpier, and it may be more difficult to notice changes. When should I see my health care provider? See your health care provider if you notice:  A change in shape or size of your breasts or nipples.  A change in the skin of your breast or nipples, such as a reddened or scaly area.  Unusual discharge from your nipples.  A lump or thick area that was not there before.  Pain in your  breasts.  Anything that concerns you.  This information is not intended to replace advice given to you by your health care provider. Make sure you discuss any questions you have with your health care provider. Document Released: 06/11/2005 Document Revised: 11/17/2015 Document Reviewed: 05/01/2015 Elsevier Interactive Patient Education  2018 [employer].  Breast Tenderness Breast tenderness is a common problem for women of all ages. Breast tenderness may cause mild discomfort to severe pain. The pain usually comes and goes in association with your menstrual cycle, but it can be constant. Breast tenderness has many possible causes, including hormone changes and some medicines. Your health care provider may order tests, such as a mammogram or an ultrasound, to check for any unusual findings. Having breast tenderness usually does not mean that you have breast cancer. Follow these instructions at home: Sometimes, reassurance that you do not have breast cancer is all that is needed. In general, follow these home care instructions: Managing pain and discomfort  If directed, apply ice to the area: ? Put ice in a plastic bag. ? Place a towel between your skin and the bag. ? Leave the ice on for 20 minutes, 2-3 times a day.  Make sure you are wearing a supportive bra, especially during exercise. You may also want to wear a supportive bra while sleeping if your breasts are very tender. Medicines  Take over-the-counter and prescription medicines only as told by your health care provider. If the cause of your pain is infection, you may be prescribed an antibiotic medicine.  If you were prescribed an antibiotic, take it as told by your health care provider. Do not stop taking the antibiotic even if you start to feel better. General instructions  Your health care provider may recommend that you reduce the amount of fat in your diet. You can do this by: ? Limiting fried foods. ? Cooking foods using  methods, such as baking, boiling, grilling, and broiling.  Decrease the amount of caffeine in your diet. You can do this by drinking more water and choosing caffeine-free options.  Keep a log of the days and times when your breasts are most tender.  Ask your health care provider how to do breast exams at home. This will help you notice if you have an unusual growth or lump. Contact a health care provider if:  Any part of your breast is hard, red, and hot to the touch. This may be a sign of infection.  You are not breastfeeding and you have fluid, especially blood or pus, coming out of your nipples.  You have a fever.  You have a new or painful lump in your breast that remains after your menstrual period ends.  Your pain does not improve or it gets worse.  Your pain is interfering with your daily activities. This information is not intended to replace advice given to you by your health care provider. Make  sure you discuss any questions you have with your health care provider. Document Released: 05/24/2008 Document Revised: 03/09/2016 Document Reviewed: 03/09/2016 Elsevier Interactive Patient Education  [employer]2018 [employer] Inc[employer]

## 2017-03-07 LAB — VAGINITIS/VAGINOSIS, DNA PROBE
Candida Species: NEGATIVE
Gardnerella vaginalis: NEGATIVE
Trichomonas vaginosis: NEGATIVE

## 2017-03-08 LAB — PROLACTIN: Prolactin: 11 ng/mL (ref 4.8–23.3)

## 2017-03-08 LAB — TSH: TSH: 2.57 u[IU]/mL (ref 0.450–4.500)

## 2017-03-08 LAB — CBC
HEMATOCRIT: 37.7 % (ref 34.0–46.6)
Hemoglobin: 11.5 g/dL (ref 11.1–15.9)
MCH: 22.5 pg — ABNORMAL LOW (ref 26.6–33.0)
MCHC: 30.5 g/dL — ABNORMAL LOW (ref 31.5–35.7)
MCV: 74 fL — ABNORMAL LOW (ref 79–97)
PLATELETS: 481 10*3/uL — AB (ref 150–379)
RBC: 5.1 x10E6/uL (ref 3.77–5.28)
RDW: 17.7 % — ABNORMAL HIGH (ref 12.3–15.4)
WBC: 7.3 10*3/uL (ref 3.4–10.8)

## 2017-03-08 LAB — TESTT+TESTF+SHBG
Sex Hormone Binding: 19.2 nmol/L — ABNORMAL LOW (ref 24.6–122.0)
Testosterone, Free: 2.7 pg/mL (ref 0.0–4.2)
Testosterone, total: 46.8 ng/dL (ref 10.0–55.0)

## 2017-03-08 LAB — DHEA-SULFATE: DHEA SO4: 153.6 ug/dL (ref 84.8–378.0)

## 2017-03-08 LAB — 17-HYDROXYPROGESTERONE: 17 HYDROXYPROGESTERONE: 75 ng/dL

## 2017-03-11 ENCOUNTER — Telehealth: Payer: Self-pay

## 2017-03-11 DIAGNOSIS — N6452 Nipple discharge: Secondary | ICD-10-CM

## 2017-03-11 NOTE — Telephone Encounter (Signed)
Please set her up for right breast imaging for nipple d/c.

## 2017-03-11 NOTE — Telephone Encounter (Signed)
-----   Message from Romualdo Bolk, MD sent at 03/07/2017  5:28 PM EDT ----- Please inform the patient that some of her labs to explain the hair growth are pending, the rest of her lab work was normal.  Please try and get more details about the nipple d/c. Her prolactin was negative, but if she is having nipple d/c she should have imaging. Please ask her when the last time was that she noticed this and confirm it was bilateral. Thanks

## 2017-03-11 NOTE — Telephone Encounter (Addendum)
Spoke with patient. Advised of message and results as seen below from Dr.Jertson. Patient verbalizes understanding. States nipple discharge was from right breast only. Occurred 1 month ago. No discharge since. Advised will review with Dr.Jertson as imaging will likely be needed.  Dr.Jertson please review CBC and TestT+TestF+SHBG.

## 2017-03-12 NOTE — Addendum Note (Signed)
Addended by: Nolen Mu E on: 03/12/2017 10:40 AM   Modules accepted: Orders

## 2017-03-12 NOTE — Telephone Encounter (Signed)
Spoke with patient. Advised of message as seen below from Dr.Jertson. Patient is agreeable. Advised will [facility] for [facility]. Patient requests early morning appointment.  Spoke with the [facility] appointment scheduled for bilateral diagnostic imaging with right breast ultrasound on  03/15/2017 at 8:10 am. Spoke with patient who states she has another appointment at that time and would like to reschedule to later in the day on 03/15/2017.  Call to the [facility] appointment moved to 03/15/2017 at 10 am. Spoke with patient who is agreeable to date and time. Placed in mammogram hold.  Routing to provider as Lorain Childes. Encounter previously closed.

## 2017-03-13 ENCOUNTER — Telehealth: Payer: Self-pay | Admitting: Obstetrics and Gynecology

## 2017-03-13 ENCOUNTER — Telehealth: Payer: Self-pay | Admitting: *Deleted

## 2017-03-13 NOTE — Telephone Encounter (Signed)
Patient called back regarding lab results. Advised that all labs were normal. Patient states she is still having continuous vaginal discharge. It has not changed since her last visit but she is very bothered by it. She is asking what else she can do to help with the discharge.Please advise

## 2017-03-13 NOTE — Telephone Encounter (Signed)
Spoke with patient and gave message as below. Patient voiced understanding and agrees to plan -eh

## 2017-03-13 NOTE — Telephone Encounter (Signed)
Call placed to patient review benefits for recommended endometrial biopsy. Left voicemail message requesting a return call.   cc: Nigel Sloop

## 2017-03-13 NOTE — Telephone Encounter (Signed)
Her vaginitis panel was negative for infection. Hormonal changes can sometime cause changes in discharge. She is supposed to come back for an endometrial biopsy. We can discuss her more at that visit. It is very important that she have further evaluation. Please make sure that biopsy appointment gets set up.

## 2017-03-14 NOTE — Telephone Encounter (Signed)
Spoke with patient regarding benefit for endometrial biopsy. Patient understood and agreeable. Patient ready to schedule. Patient scheduled 03/27/17 with Dr Oscar La. Patient aware of appointment date, arrival time and cancellation policy.   Call transferred to Triage Nurse, Carmelina Dane, RN, to address questions patient has in regards to the scheduled procedure.  Routing to Carmelina Dane, Charity fundraiser

## 2017-03-14 NOTE — Telephone Encounter (Signed)
Call transferred from [place]. Reviewed EMB procedure, questions answered. Advised patient to take Motrin 800 mg with food and water one hour before procedure. Patient verbalizes understanding and is agreeable.  Routing to provider for final review. Patient is agreeable to disposition. Will close encounter.

## 2017-03-15 ENCOUNTER — Ambulatory Visit
Admission: RE | Admit: 2017-03-15 | Discharge: 2017-03-15 | Disposition: A | Discharge status: Home / Self Care | Payer: BLUE CROSS/BLUE SHIELD | Source: Ambulatory Visit | Attending: Obstetrics and Gynecology | Admitting: Obstetrics and Gynecology

## 2017-03-15 ENCOUNTER — Other Ambulatory Visit: Payer: BLUE CROSS/BLUE SHIELD

## 2017-03-15 DIAGNOSIS — N6452 Nipple discharge: Secondary | ICD-10-CM

## 2017-03-15 DIAGNOSIS — R928 Other abnormal and inconclusive findings on diagnostic imaging of breast: Secondary | ICD-10-CM | POA: Diagnosis not present

## 2017-03-15 DIAGNOSIS — N6489 Other specified disorders of breast: Secondary | ICD-10-CM | POA: Diagnosis not present

## 2017-03-25 ENCOUNTER — Telehealth: Payer: Self-pay | Admitting: Obstetrics and Gynecology

## 2017-03-25 NOTE — Telephone Encounter (Signed)
Patient has an appointment on 03/27/17 for an "EMB" and she has questions for the nurse prior to that.

## 2017-03-25 NOTE — Telephone Encounter (Signed)
We should try and figure out/treat her vaginitis prior to biopsy. I can try and see her tomorrow, or on the 3rd I can evaluate her for vaginitis. If negative evaluation we could proceed with the biopsy, if + I would treat it and have her come back for the biopsy.

## 2017-03-25 NOTE — Telephone Encounter (Signed)
Spoke with patient. All questions answered about EMB. Results and message given as seen below from Dr.Jertson. Patient verbalizes understanding. Patient is scheduled for EMB on 03/27/2017 and states she started having yeast infection symptoms. Vaginal discharge, irritation, and itching. Asking if it is okay to proceed with EMB with symptoms. Advised will review with Dr.Jertson.  Notes recorded by Romualdo Bolk, MD on 03/18/2017 at 2:12 PM EDT Per the phone note on 9/17, the patient only reported right nipple d/c. At the breast center she c/o bilateral nipple d/c. She had negative imaging and a normal prolactin.  Please advise her not to check for drainage (ie don't express, it stimulated d/c). She should call if it is recurrent.

## 2017-03-26 NOTE — Telephone Encounter (Signed)
Spoke with patient. Advised of message as seen below from Dr.Jertson. Patient verbalizes understanding. Would like to keep appointment as scheduled. Aware if evaluation is positive she will be treated and have to return for biopsy. If negative may proceed with biopsy. Patient is agreeable.  Routing to provider for final review. Patient agreeable to disposition. Will close encounter.

## 2017-03-27 ENCOUNTER — Ambulatory Visit (INDEPENDENT_AMBULATORY_CARE_PROVIDER_SITE_OTHER): Payer: BLUE CROSS/BLUE SHIELD | Admitting: Obstetrics and Gynecology

## 2017-03-27 ENCOUNTER — Encounter: Payer: Self-pay | Admitting: Obstetrics and Gynecology

## 2017-03-27 VITALS — BP 140/84 | HR 76 | Resp 18 | Wt 319.0 lb

## 2017-03-27 DIAGNOSIS — Z01812 Encounter for preprocedural laboratory examination: Secondary | ICD-10-CM

## 2017-03-27 DIAGNOSIS — R21 Rash and other nonspecific skin eruption: Secondary | ICD-10-CM | POA: Diagnosis not present

## 2017-03-27 DIAGNOSIS — E113299 Type 2 diabetes mellitus with mild nonproliferative diabetic retinopathy without macular edema, unspecified eye: Secondary | ICD-10-CM | POA: Diagnosis not present

## 2017-03-27 DIAGNOSIS — N926 Irregular menstruation, unspecified: Secondary | ICD-10-CM

## 2017-03-27 DIAGNOSIS — N882 Stricture and stenosis of cervix uteri: Secondary | ICD-10-CM | POA: Diagnosis not present

## 2017-03-27 DIAGNOSIS — N912 Amenorrhea, unspecified: Secondary | ICD-10-CM | POA: Diagnosis not present

## 2017-03-27 DIAGNOSIS — K219 Gastro-esophageal reflux disease without esophagitis: Secondary | ICD-10-CM | POA: Diagnosis not present

## 2017-03-27 DIAGNOSIS — R030 Elevated blood-pressure reading, without diagnosis of hypertension: Secondary | ICD-10-CM | POA: Diagnosis not present

## 2017-03-27 LAB — POCT URINE PREGNANCY: Preg Test, Ur: NEGATIVE

## 2017-03-27 NOTE — Progress Notes (Signed)
GYNECOLOGY  VISIT   HPI: 31 y.o.   Single  African American  female   G0P0000 with No LMP recorded. Patient is not currently having periods (Reason: Irregular Periods).   here for endometrial biopsy for AUB. She bleed for one year straight, then no bleeding since June.  Normal TSH, normal prolactin  GYNECOLOGIC HISTORY: No LMP recorded. Patient is not currently having periods (Reason: Irregular Periods). Contraception:none  Menopausal hormone therapy: none        OB History    Gravida Para Term Preterm AB Living   0 0 0 0 0 0   SAB TAB Ectopic Multiple Live Births   0 0 0 0           Patient Active Problem List   Diagnosis Date Noted  . PCOS (polycystic ovarian syndrome) 01/09/2013  . Oligomenorrhea 12/12/2012    Past Medical History:  Diagnosis Date  . Allergy   . Diabetes mellitus without complication ([place]) 03/14/15   no meds at this time  . GERD (gastroesophageal reflux disease)   . PCOS (polycystic ovarian syndrome)     No past surgical history on file.  Current Outpatient Prescriptions  Medication Sig Dispense Refill  . Cholecalciferol (VITAMIN D) 2000 units CAPS Take 2 capsules by mouth daily.    . metFORMIN (GLUCOPHAGE-XR) 500 MG 24 hr tablet Take 1 tablet by mouth at bedtime.    . Multiple Vitamin (MULTIVITAMIN) tablet Take 1 tablet by mouth daily.     No current facility-administered medications for this visit.      ALLERGIES: Patient has no known allergies.  Family History  Problem Relation Age of Onset  . Diabetes Mother   . Hypertension Mother   . Hypertension Maternal Grandmother   . Hyperlipidemia Maternal Grandmother   . Breast cancer Cousin 30       chemo is done and upcoming surgery - ?BRCA Negative    Social History   Social History  . Marital status: Single    Spouse name: N/A  . Number of children: N/A  . Years of education: N/A   Occupational History  . 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  Smoking status: Never Smoker  . Smokeless tobacco: Never Used  . Alcohol use No  . Drug use: No  . Sexual activity: Yes    Partners: Male    Birth control/ protection: None   Other Topics Concern  . Not on file   Social History Narrative  . No narrative on file    Review of Systems  Constitutional: Negative.   HENT: Negative.   Eyes: Negative.   Respiratory: Negative.   Cardiovascular: Negative.   Gastrointestinal: Negative.   Genitourinary:       Irregular cycles   Musculoskeletal: Negative.   Skin: Negative.   Neurological: Negative.   Endo/Heme/Allergies: Negative.   Psychiatric/Behavioral: Negative.     PHYSICAL EXAMINATION:    BP 140/84 (BP Location: Right Arm, Patient Position: Sitting, Cuff Size: Normal)   Pulse 76   Resp 18   Wt (!) 319 lb (144.7 kg)   BMI 53.08 kg/m     General appearance: alert, cooperative and appears stated age  Pelvic: External genitalia:  no lesions              Urethra:  normal appearing urethra with no masses, tenderness or lesions              Bartholins and Skenes: normal  Vagina: normal appearing vagina with normal color and discharge, no lesions              Cervix: no lesions  The risks of endometrial biopsy were reviewed and a consent was obtained.  A speculum was placed in the vagina and the cervix was cleansed with betadine. A tenaculum was placed on the cervix, the mini-pipelle would not pass into the endometrial cavity. The cervix was dilated with the mini-dilator. The uterine evacuator was used to perform an endometrial biopsy (uterus is anteverted and the curve in the Pipelle was helpful). The uterus sounded to 7-8 cm. The endometrial biopsy was performed, minimal tissue was obtained. The tenaculum and speculum were removed. There were no complications.   Chaperone was present for exam.  ASSESSMENT AUB, no bleeding since June. Not sexually active. Normal TSH and prolactin Elevated BP without diagnosis of  HTN, only elevated here. States normal with her primary this am.     PLAN Endometrial biopsy Will likely set up an ultrasound after the biopsy results are back    An After Visit Summary was printed and given to the patient.  CC: Dr Nancy Fetter

## 2017-03-27 NOTE — Patient Instructions (Signed)

## 2017-04-01 ENCOUNTER — Other Ambulatory Visit: Payer: Self-pay | Admitting: *Deleted

## 2017-04-01 DIAGNOSIS — N939 Abnormal uterine and vaginal bleeding, unspecified: Secondary | ICD-10-CM

## 2017-04-09 DIAGNOSIS — L700 Acne vulgaris: Secondary | ICD-10-CM | POA: Diagnosis not present

## 2017-04-09 DIAGNOSIS — L821 Other seborrheic keratosis: Secondary | ICD-10-CM | POA: Diagnosis not present

## 2017-04-18 ENCOUNTER — Telehealth: Payer: Self-pay | Admitting: *Deleted

## 2017-04-18 NOTE — Telephone Encounter (Signed)
Called patient and left message to call back -eh     Diane Padilla,  This patient needs some treatment for her lack of cycles while we wait for the ultrasound. She needs endometrial protection. Please check with her if she has been sexually active. If not she should check a upt, if negative take provera x 5 days and call with or without a bleed. If sexually active, she needs to use protection or avoid intercourse x 2 weeks, then follow above plan.  [name]  Previous

## 2017-04-23 ENCOUNTER — Other Ambulatory Visit: Payer: BLUE CROSS/BLUE SHIELD | Admitting: Obstetrics and Gynecology

## 2017-04-23 ENCOUNTER — Other Ambulatory Visit: Payer: BLUE CROSS/BLUE SHIELD

## 2017-05-02 NOTE — Telephone Encounter (Signed)
Left another message to call back -eh 

## 2017-05-06 ENCOUNTER — Encounter: Payer: Self-pay | Admitting: *Deleted

## 2017-05-06 NOTE — Telephone Encounter (Signed)
Please send her a letter and close the encounter

## 2017-05-06 NOTE — Telephone Encounter (Signed)
Letter sent-eh 

## 2017-05-06 NOTE — Telephone Encounter (Signed)
I have left two message for patient with no return call. Please advise

## 2017-07-29 DIAGNOSIS — E113299 Type 2 diabetes mellitus with mild nonproliferative diabetic retinopathy without macular edema, unspecified eye: Secondary | ICD-10-CM | POA: Diagnosis not present

## 2017-07-29 DIAGNOSIS — R51 Headache: Secondary | ICD-10-CM | POA: Diagnosis not present

## 2017-07-30 ENCOUNTER — Ambulatory Visit: Payer: BLUE CROSS/BLUE SHIELD | Admitting: Certified Nurse Midwife

## 2017-07-31 ENCOUNTER — Other Ambulatory Visit: Payer: Self-pay

## 2017-07-31 ENCOUNTER — Encounter: Payer: Self-pay | Admitting: Obstetrics and Gynecology

## 2017-07-31 ENCOUNTER — Ambulatory Visit (INDEPENDENT_AMBULATORY_CARE_PROVIDER_SITE_OTHER): Payer: BLUE CROSS/BLUE SHIELD | Admitting: Obstetrics and Gynecology

## 2017-07-31 VITALS — BP 128/84 | HR 84 | Resp 16 | Wt 324.0 lb

## 2017-07-31 DIAGNOSIS — N912 Amenorrhea, unspecified: Secondary | ICD-10-CM | POA: Diagnosis not present

## 2017-07-31 DIAGNOSIS — N898 Other specified noninflammatory disorders of vagina: Secondary | ICD-10-CM

## 2017-07-31 MED ORDER — MEDROXYPROGESTERONE ACETATE 5 MG PO TABS: ORAL_TABLET | ORAL | 1 refills | Status: DC

## 2017-07-31 MED ORDER — MEDROXYPROGESTERONE ACETATE 10 MG PO TABS: 10.0000 mg | ORAL_TABLET | Freq: Every day | ORAL | 0 refills | Status: DC

## 2017-07-31 NOTE — Progress Notes (Signed)
GYNECOLOGY  VISIT   HPI: 32 y.o.   Single  African American  female   Diane Padilla with Patient's last menstrual period was 12/04/2016.   here c/o vaginal discharge and itching  X 7-10 days. Started with a little blood and then brown d/c. It has come and gone over the last 7-10. She has had some irritation. No current itching, burning. She does a slight odor. She is not sexually active with penetration for over a year.   Got engaged in 12/18, getting married next December. She has a long h/o oligomenorrhea, endometrial biopsy in 10/18 was benign. She never came back for the recommended ultrasound. Now she hasn't had a cycle in 8 months. Normal TSH and prolactin.   GYNECOLOGIC HISTORY: Patient's last menstrual period was 12/04/2016. Contraception:none Menopausal hormone therapy: none         OB History    Gravida Para Term Preterm AB Living   0 0 0 0 0 0   SAB TAB Ectopic Multiple Live Births   0 0 0 0           Patient Active Problem List   Diagnosis Date Noted  . PCOS (polycystic ovarian syndrome) 01/09/2013  . Oligomenorrhea 12/12/2012    Past Medical History:  Diagnosis Date  . Allergy   . Diabetes mellitus without complication ([place]) 6/64/40   no meds at this time  . GERD (gastroesophageal reflux disease)   . PCOS (polycystic ovarian syndrome)     History reviewed. No pertinent surgical history.  Current Outpatient Medications  Medication Sig Dispense Refill  . Cholecalciferol (VITAMIN D) 2000 units CAPS Take 2 capsules by mouth daily.    . metFORMIN (GLUCOPHAGE-XR) 500 MG 24 hr tablet Take 1 tablet by mouth at bedtime.    . Multiple Vitamin (MULTIVITAMIN) tablet Take 1 tablet by mouth daily.     No current facility-administered medications for this visit.      ALLERGIES: Patient has no known allergies.  Family History  Problem Relation Age of Onset  . Diabetes Mother   . Hypertension Mother   . Hypertension Maternal Grandmother   . Hyperlipidemia Maternal  Grandmother   . Breast cancer Cousin 30       chemo is done and upcoming surgery - ?BRCA Negative    Social History   Socioeconomic History  . Marital status: Single    Spouse name: Not on file  . Number of children: Not on file  . Years of education: Not on file  . Highest education level: Not on file  Social Needs  . Financial resource strain: Not on file  . Food insecurity - worry: Not on file  . Food insecurity - inability: Not on file  . Transportation needs - medical: Not on file  . Transportation needs - non-medical: Not on file  Occupational History  . 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 Smoking status: Never Smoker  . Smokeless tobacco: Never Used  Substance and Sexual Activity  . Alcohol use: No  . Drug use: No  . Sexual activity: Yes    Partners: Male    Birth control/protection: None  Other Topics Concern  . Not on file  Social History Narrative  . Not on file    Review of Systems  Constitutional: Negative.   HENT: Negative.   Eyes: Negative.   Respiratory: Negative.   Cardiovascular: Negative.   Gastrointestinal: Negative.   Genitourinary:  Vaginal discharge and itching   Musculoskeletal: Negative.   Skin: Negative.   Neurological: Negative.   Endo/Heme/Allergies: Negative.   Psychiatric/Behavioral: Negative.     PHYSICAL EXAMINATION:    BP 128/84 (BP Location: Right Wrist, Patient Position: Sitting, Cuff Size: Normal)   Pulse 84   Resp 16   Wt (!) 324 lb (147 kg)   LMP 12/04/2016   BMI 53.92 kg/m     General appearance: alert, cooperative and appears stated age  Pelvic: External genitalia:  no lesions              Urethra:  normal appearing urethra with no masses, tenderness or lesions              Bartholins and Skenes: normal                 Vagina: normal appearing vagina with normal color and discharge, no lesions              Cervix: no lesions               Chaperone was present  for exam.  Wet prep: no clue, no trich, few wbc KOH: no yeast PH: 5.5   ASSESSMENT Amenorrhea, h/o very irregular bleeding prior to that Negative endometrial biopsy in 10/18, never returned for ultrasound, now without any bleeding for 8 months Vaginal d/c with odor Getting married at the end of the year, is interested in conception after she gets married.    PLAN Discussed with patient the risks of amenorrhea long term and the risk of endometrial cancer down the road Not sexually active for over a year Discussed weight loss is the best way to help her ovulate on her own, information on medical weight loss clinics given Provera 10 mg x 10 days now, cyclic provera every other month as needed Menstrual calendar given, recommended she call if she is bleeding >1 pad/hour or bleeding for more than 7 days Will hold on ultrasound for now Annual exam in 7/19   An After Visit Summary was printed and given to the patient.  ~20 minutes face to face time of which over 50% was spent in counseling.

## 2017-07-31 NOTE — Addendum Note (Signed)
Addended by: Shelda JakesHANNER, Sharlotte Baka E on: 07/31/2017 03:15 PM   Modules accepted: Orders

## 2017-08-01 LAB — VAGINITIS/VAGINOSIS, DNA PROBE
Candida Species: NEGATIVE
Gardnerella vaginalis: NEGATIVE
Trichomonas vaginosis: NEGATIVE

## 2017-09-16 DIAGNOSIS — A09 Infectious gastroenteritis and colitis, unspecified: Secondary | ICD-10-CM | POA: Diagnosis not present

## 2017-11-25 DIAGNOSIS — M778 Other enthesopathies, not elsewhere classified: Secondary | ICD-10-CM | POA: Diagnosis not present

## 2017-12-25 ENCOUNTER — Ambulatory Visit: Payer: BLUE CROSS/BLUE SHIELD | Admitting: Obstetrics and Gynecology

## 2017-12-25 DIAGNOSIS — R400 Somnolence: Secondary | ICD-10-CM | POA: Diagnosis not present

## 2017-12-25 DIAGNOSIS — E11319 Type 2 diabetes mellitus with unspecified diabetic retinopathy without macular edema: Secondary | ICD-10-CM | POA: Diagnosis not present

## 2017-12-30 NOTE — Progress Notes (Deleted)
32 y.o. G0P0000 SingleAfrican AmericanF here for annual exam.      No LMP recorded. (Menstrual status: Irregular Periods).          Sexually active: {yes no:314532}  The current method of family planning is none.    Exercising: {yes no:314532}  {types:19826} Smoker:  {YES NO:22349}  Health Maintenance: Pap:  02-16-12 neg, 11-16-14 neg HPV HR neg History of Abnormal Pap: no Colonoscopy:  none TDaP:  2011 Gardasil:    reports that she has never smoked. She has never used smokeless tobacco. She reports that she does not drink alcohol or use drugs.  Past Medical History:  Diagnosis Date  . Allergy   . Diabetes mellitus without complication ([place]) 12/11/48   no meds at this time  . GERD (gastroesophageal reflux disease)   . PCOS (polycystic ovarian syndrome)     No past surgical history on file.  Current Outpatient Medications  Medication Sig Dispense Refill  . Cholecalciferol (VITAMIN D) 2000 units CAPS Take 2 capsules by mouth daily.    . medroxyPROGESTERone (PROVERA) 10 MG tablet Take 1 tablet (10 mg total) by mouth daily. 10 tablet 0  . medroxyPROGESTERone (PROVERA) 5 MG tablet Starting in April, 2019 take one tablet a day for 5 days every other month if no spontaneous menses. 15 tablet 1  . metFORMIN (GLUCOPHAGE-XR) 500 MG 24 hr tablet Take 1 tablet by mouth at bedtime.    . Multiple Vitamin (MULTIVITAMIN) tablet Take 1 tablet by mouth daily.     No current facility-administered medications for this visit.     Family History  Problem Relation Age of Onset  . Diabetes Mother   . Hypertension Mother   . Hypertension Maternal Grandmother   . Hyperlipidemia Maternal Grandmother   . Breast cancer Cousin 30       chemo is done and upcoming surgery - ?BRCA Negative    Review of Systems  Exam:   There were no vitals taken for this visit.  Weight change: _0 @ Height:      Ht Readings from Last 3 Encounters:  02/19/17 _1  (1.651 m)  12/24/16 5' 5.5" (1.664 m)   12/23/15 5' 5.25" (1.657 m)    General appearance: alert, cooperative and appears stated age Head: Normocephalic, without obvious abnormality, atraumatic Neck: no adenopathy, supple, symmetrical, trachea midline and thyroid {CHL AMB PHY EX THYROID NORM DEFAULT:[phone]::"normal to inspection and palpation"} Lungs: clear to auscultation bilaterally Cardiovascular: regular rate and rhythm Breasts: {Exam; breast:13139::"normal appearance, no masses or tenderness"} Abdomen: soft, non-tender; non distended,  no masses,  no organomegaly Extremities: extremities normal, atraumatic, no cyanosis or edema Skin: Skin color, texture, turgor normal. No rashes or lesions Lymph nodes: Cervical, supraclavicular, and axillary nodes normal. No abnormal inguinal nodes palpated Neurologic: Grossly normal   Pelvic: External genitalia:  no lesions              Urethra:  normal appearing urethra with no masses, tenderness or lesions              Bartholins and Skenes: normal                 Vagina: normal appearing vagina with normal color and discharge, no lesions              Cervix: {CHL AMB PHY EX CERVIX NORM [phone]::"no lesions"}               Bimanual Exam:  Uterus:  {CHL AMB PHY EX UTERUS NORM DEFAULT:[phone]::"normal  size, contour, position, consistency, mobility, non-tender"}              Adnexa: {CHL AMB PHY EX ADNEXA NO MASS [phone]::"no mass, fullness, tenderness"}               Rectovaginal: Confirms               Anus:  normal sphincter tone, no lesions  Chaperone was present for exam.  A:  Well Woman with normal exam  P:

## 2018-01-01 ENCOUNTER — Ambulatory Visit: Payer: BLUE CROSS/BLUE SHIELD | Admitting: Obstetrics and Gynecology

## 2018-01-22 DIAGNOSIS — G4733 Obstructive sleep apnea (adult) (pediatric): Secondary | ICD-10-CM | POA: Diagnosis not present

## 2018-01-27 ENCOUNTER — Telehealth: Payer: Self-pay | Admitting: Obstetrics and Gynecology

## 2018-01-27 NOTE — Telephone Encounter (Signed)
Patient called and requested an appointment with Dr. Oscar LaJertson for abnormal menstrual bleeding she said has been going on since February of this year.  Last seen: 07/31/17

## 2018-01-27 NOTE — Telephone Encounter (Signed)
Patient returned call

## 2018-01-27 NOTE — Telephone Encounter (Signed)
Spoke with patient regarding benefit for scheduled ultrasound. Patient understood and agreeable. Patient scheduled 01/28/18 with Dr Oscar LaJertson. Patient aware of appointment date, arrival time and cancellation policy. No further questions. Ok to close

## 2018-01-27 NOTE — Progress Notes (Signed)
GYNECOLOGY  VISIT   HPI: 32 y.o.   Single  African American  female   Diane Padilla with Patient's last menstrual period was 08/16/2017.   here for evaluation of AUB. She has a long h/o oligomenorrhea, she had a benign endometrial biopsy in 10/18 with a normal TSH and prolactin. In 2/19 she reported no cycle for 8 months. She was given a script for provera to take then and cyclically as needed. She never took the provera because her cycle started on it own on 08/17/17. She has been bleeding since then, it has varied from light to heavy. Heavy for 5-10 days of the month, the rest of the month is light. At the most she is saturating a pad 1-2 hours.  Not sexually active for over a year. Getting married in 2/20, waiting until then to be sexually active.  She c/o a 2 week h/o a dry, itchy patch on her right nipple. She has seen a dermatologist previously for a similar issues. He had her cerve cream, which helped some, now irritating it.   GYNECOLOGIC HISTORY: Patient's last menstrual period was 08/16/2017. Contraception: None Menopausal hormone therapy: None        OB History    Gravida  0   Para  0   Term  0   Preterm  0   AB  0   Living  0     SAB  0   TAB  0   Ectopic  0   Multiple  0   Live Births                 Patient Active Problem List   Diagnosis Date Noted  . PCOS (polycystic ovarian syndrome) 01/09/2013  . Oligomenorrhea 12/12/2012    Past Medical History:  Diagnosis Date  . Allergy   . Diabetes mellitus without complication ([place]) 08/05/92   no meds at this time  . GERD (gastroesophageal reflux disease)   . PCOS (polycystic ovarian syndrome)   . Sleep apnea     Past Surgical History:  Procedure Laterality Date  . WISDOM TOOTH EXTRACTION      Current Outpatient Medications  Medication Sig Dispense Refill  . Cholecalciferol (VITAMIN D) 2000 units CAPS Take 2 capsules by mouth daily.    . metFORMIN (GLUCOPHAGE-XR) 500 MG 24 hr tablet Take 1 tablet by  mouth at bedtime.    . Multiple Vitamin (MULTIVITAMIN) tablet Take 1 tablet by mouth daily.     No current facility-administered medications for this visit.      ALLERGIES: Patient has no known allergies.  Family History  Problem Relation Age of Onset  . Diabetes Mother   . Hypertension Mother   . Hypertension Maternal Grandmother   . Hyperlipidemia Maternal Grandmother   . Breast cancer Cousin 30       chemo is done and upcoming surgery - ?BRCA Negative    Social History   Socioeconomic History  . Marital status: Single    Spouse name: Not on file  . Number of children: Not on file  . Years of education: Not on file  . Highest education level: Not on file  Occupational History  . 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 Financial resource strain: Not on file  . Food insecurity:    Worry: Not on file    Inability: Not on file  . Transportation needs:    Medical: Not on file  Non-medical: Not on file  Tobacco Use  . Smoking status: Never Smoker  . Smokeless tobacco: Never Used  Substance and Sexual Activity  . Alcohol use: No  . Drug use: No  . Sexual activity: Yes    Partners: Male    Birth control/protection: None  Lifestyle  . Physical activity:    Days per week: Not on file    Minutes per session: Not on file  . Stress: Not on file  Relationships  . Social connections:    Talks on phone: Not on file    Gets together: Not on file    Attends religious service: Not on file    Active member of club or organization: Not on file    Attends meetings of clubs or organizations: Not on file    Relationship status: Not on file  . Intimate partner violence:    Fear of current or ex partner: Not on file    Emotionally abused: Not on file    Physically abused: Not on file    Forced sexual activity: Not on file  Other Topics Concern  . Not on file  Social History Narrative  . Not on file    Review of Systems   Constitutional: Negative.   HENT: Negative.   Eyes: Negative.   Respiratory: Negative.   Cardiovascular: Negative.   Gastrointestinal: Negative.   Genitourinary: Negative.        Excess menstrual bleeding Breast pain Breast discharge  Musculoskeletal: Negative.   Skin: Negative.   Neurological: Negative.   Endo/Heme/Allergies: Negative.   Psychiatric/Behavioral: Negative.   All other systems reviewed and are negative.   PHYSICAL EXAMINATION:    BP (!) 168/98   Pulse 96   Resp 18   Ht _0  (1.651 m)   Wt (!) 322 lb (146.1 kg)   LMP 08/16/2017 Comment: constant  BMI 53.58 kg/m     General appearance: alert, cooperative and appears stated age Skin: dry area just above her right nipple, slight increase in pigmentation.   Ultrasound images reviewed with the patient  ASSESSMENT Anovulatory bleeding for 6 months. Prior normal TSH and prolactin. Not sexually active for over a year. Normal u/s other than enlarged ovaries c/w PCOS Dry, irritated skin on her right areolar region. No concerning features    PLAN CBC, ferritin Start provera 10 mg bid until bleeding stops, then 1 tablet a day for 10 days. Calendar all bleeding F/U for annual exam (scheduled in 9/19) Discussed calling if her bleeding is outside normal parameters. She will try vaseline or an over the counter steroid ointment and f/u with dermatology if not improved.   An After Visit Summary was printed and given to the patient.  ~20 minutes was spent face to face with the patient, over 50% in counseling

## 2018-01-27 NOTE — Telephone Encounter (Signed)
Spoke with patient.   1. Patient reports menses has been ongoing since 07/2017. Flow fluctuates from mild to moderate. Denies pain, SOB, weakness, dizziness, fatigue. Not currently SA, not on contraceptive.   2. Patient reports small, rough, itchy patch on right breast above nipple, has been present for 2-3 wks. No lumps or nipple d/c.   Per review of Epic, PUS previously ordered for AUB. Recommended PUS for further evaluation. PUS scheduled for 01/28/18 at 3:30pm with consult to follow at 4pm with Dr. Oscar LaJertson.   Routing to provider for final review. Patient is agreeable to disposition. Will close encounter.  Cc: Harland DingwallSuzy Dixon

## 2018-01-27 NOTE — Telephone Encounter (Signed)
Left message to call Mckinnley Cottier at [phone].  

## 2018-01-27 NOTE — Telephone Encounter (Signed)
Call placed to patient to convey benefit information for scheduled appointment. Left voicemail message requesting a return call

## 2018-01-28 ENCOUNTER — Ambulatory Visit (INDEPENDENT_AMBULATORY_CARE_PROVIDER_SITE_OTHER): Payer: BLUE CROSS/BLUE SHIELD | Admitting: Obstetrics and Gynecology

## 2018-01-28 ENCOUNTER — Encounter: Payer: Self-pay | Admitting: Obstetrics and Gynecology

## 2018-01-28 ENCOUNTER — Ambulatory Visit (INDEPENDENT_AMBULATORY_CARE_PROVIDER_SITE_OTHER): Payer: BLUE CROSS/BLUE SHIELD

## 2018-01-28 VITALS — BP 168/98 | HR 96 | Resp 18 | Ht 65.0 in | Wt 322.0 lb

## 2018-01-28 DIAGNOSIS — N939 Abnormal uterine and vaginal bleeding, unspecified: Secondary | ICD-10-CM

## 2018-01-28 DIAGNOSIS — E282 Polycystic ovarian syndrome: Secondary | ICD-10-CM | POA: Diagnosis not present

## 2018-01-28 DIAGNOSIS — N921 Excessive and frequent menstruation with irregular cycle: Secondary | ICD-10-CM

## 2018-01-28 DIAGNOSIS — N970 Female infertility associated with anovulation: Secondary | ICD-10-CM | POA: Diagnosis not present

## 2018-01-28 MED ORDER — MEDROXYPROGESTERONE ACETATE 10 MG PO TABS: ORAL_TABLET | ORAL | 1 refills | Status: DC

## 2018-01-29 LAB — CBC
HEMOGLOBIN: 11.2 g/dL (ref 11.1–15.9)
Hematocrit: 35.9 % (ref 34.0–46.6)
MCH: 25.3 pg — AB (ref 26.6–33.0)
MCHC: 31.2 g/dL — AB (ref 31.5–35.7)
MCV: 81 fL (ref 79–97)
PLATELETS: 484 10*3/uL — AB (ref 150–450)
RBC: 4.42 x10E6/uL (ref 3.77–5.28)
RDW: 14.4 % (ref 12.3–15.4)
WBC: 10.5 10*3/uL (ref 3.4–10.8)

## 2018-01-29 LAB — FERRITIN: FERRITIN: 15 ng/mL (ref 15–150)

## 2018-01-31 DIAGNOSIS — G4733 Obstructive sleep apnea (adult) (pediatric): Secondary | ICD-10-CM | POA: Diagnosis not present

## 2018-02-07 ENCOUNTER — Telehealth: Payer: Self-pay | Admitting: Obstetrics and Gynecology

## 2018-02-07 NOTE — Telephone Encounter (Signed)
Spoke with patient. Advised to continue on tablet per day until she has completed 10 days of Provera. Advised if she continues to have bleeding after stopping provera to please call back to update.   Pt is not having heavy bleeding or feeling lightheaded or dizzy.   Advised I would send message to Dr. Oscar LaJertson to review and return call on Monday morning if she advises differently.

## 2018-02-07 NOTE — Telephone Encounter (Signed)
Notified pt and she will take tablets BID until she is done and will follow up for her annual in September or call back if starts to have bleeding concerns after completing provera.  Encounter closed.

## 2018-02-07 NOTE — Telephone Encounter (Signed)
Patient has questions regarding Provera. States that it was to be taken twice a day until period stopped. It stopped; patient began taking one a day for 10 days, but then her period started again. Does she continue taking one a day? Or start back on two a day?

## 2018-02-07 NOTE — Telephone Encounter (Signed)
I would have her restart the provera BID until it is done.

## 2018-02-18 ENCOUNTER — Encounter: Payer: Self-pay | Admitting: Obstetrics and Gynecology

## 2018-02-18 ENCOUNTER — Telehealth: Payer: Self-pay | Admitting: Obstetrics and Gynecology

## 2018-02-18 ENCOUNTER — Other Ambulatory Visit: Payer: Self-pay

## 2018-02-18 ENCOUNTER — Ambulatory Visit (INDEPENDENT_AMBULATORY_CARE_PROVIDER_SITE_OTHER): Payer: BLUE CROSS/BLUE SHIELD | Admitting: Obstetrics and Gynecology

## 2018-02-18 VITALS — BP 160/80 | HR 97 | Ht 65.0 in | Wt 324.0 lb

## 2018-02-18 DIAGNOSIS — G4733 Obstructive sleep apnea (adult) (pediatric): Secondary | ICD-10-CM | POA: Diagnosis not present

## 2018-02-18 DIAGNOSIS — N939 Abnormal uterine and vaginal bleeding, unspecified: Secondary | ICD-10-CM | POA: Diagnosis not present

## 2018-02-18 NOTE — Telephone Encounter (Signed)
Reviewed with Dr. Oscar LaJertson. Call returned to patient, OV scheduled for today at 1:15pm with Dr. Oscar LaJertson. ER precautions provided. Patient verbalizes understanding and is agreeable. Encounter closed.

## 2018-02-18 NOTE — Progress Notes (Signed)
GYNECOLOGY  VISIT   HPI: 32 y.o.   Single  African American  female   G0P0000 with No LMP recorded. (Menstrual status: Irregular Periods).   here for abnormal uterine bleeding. The patient has had a prolonged anovulatory bleed. She was treated with provera, her bleeding slowed down, never completely stopped. She finished the provera 3 days ago. Her bleeding started getting heavy yesterday. She is having gushing of blood. A pad is lasting 1.5-2 hours.  Not light headed or dizzy. She was having cramps yesterday, some lower back pain today.  She is on daily iron for a low ferritin at 15 earlier this month. Her HGB on 01/28/18 was 11.2 gm/dl  GYNECOLOGIC HISTORY: No LMP recorded. (Menstrual status: Irregular Periods). Contraception:none Menopausal hormone therapy: none        OB History    Gravida  0   Para  0   Term  0   Preterm  0   AB  0   Living  0     SAB  0   TAB  0   Ectopic  0   Multiple  0   Live Births                 Patient Active Problem List   Diagnosis Date Noted  . PCOS (polycystic ovarian syndrome) 01/09/2013  . Oligomenorrhea 12/12/2012    Past Medical History:  Diagnosis Date  . Allergy   . Diabetes mellitus without complication ([place]) 1/61/09   no meds at this time  . GERD (gastroesophageal reflux disease)   . PCOS (polycystic ovarian syndrome)   . Sleep apnea     Past Surgical History:  Procedure Laterality Date  . WISDOM TOOTH EXTRACTION      Current Outpatient Medications  Medication Sig Dispense Refill  . Cholecalciferol (VITAMIN D) 2000 units CAPS Take 2 capsules by mouth daily.    . medroxyPROGESTERone (PROVERA) 10 MG tablet 1 tablet BID until bleeding stops, then take one tablet a day for 10 days 20 tablet 1  . metFORMIN (GLUCOPHAGE-XR) 500 MG 24 hr tablet Take 1 tablet by mouth at bedtime.    . Multiple Vitamin (MULTIVITAMIN) tablet Take 1 tablet by mouth daily.     No current facility-administered medications for this visit.       ALLERGIES: Patient has no known allergies.  Family History  Problem Relation Age of Onset  . Diabetes Mother   . Hypertension Mother   . Hypertension Maternal Grandmother   . Hyperlipidemia Maternal Grandmother   . Breast cancer Cousin 30       chemo is done and upcoming surgery - ?BRCA Negative    Social History   Socioeconomic History  . Marital status: Single    Spouse name: Not on file  . Number of children: Not on file  . Years of education: Not on file  . Highest education level: Not on file  Occupational History  . 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 Financial resource strain: Not on file  . Food insecurity:    Worry: Not on file    Inability: Not on file  . Transportation needs:    Medical: Not on file    Non-medical: Not on file  Tobacco Use  . Smoking status: Never Smoker  . Smokeless tobacco: Never Used  Substance and Sexual Activity  . Alcohol use: No  . Drug use: No  . Sexual activity: Not Currently  Partners: Male    Birth control/protection: None  Lifestyle  . Physical activity:    Days per week: Not on file    Minutes per session: Not on file  . Stress: Not on file  Relationships  . Social connections:    Talks on phone: Not on file    Gets together: Not on file    Attends religious service: Not on file    Active member of club or organization: Not on file    Attends meetings of clubs or organizations: Not on file    Relationship status: Not on file  . Intimate partner violence:    Fear of current or ex partner: Not on file    Emotionally abused: Not on file    Physically abused: Not on file    Forced sexual activity: Not on file  Other Topics Concern  . Not on file  Social History Narrative  . Not on file    Review of Systems  Constitutional: Negative.   HENT: Negative.   Eyes: Negative.   Respiratory: Negative.   Cardiovascular: Negative.   Gastrointestinal: Negative.    Genitourinary: Negative.        Abnormal uterine bleeding  Musculoskeletal: Negative.   Skin: Negative.   Neurological: Negative.   Endo/Heme/Allergies: Negative.   Psychiatric/Behavioral: Negative.   All other systems reviewed and are negative.   PHYSICAL EXAMINATION:    There were no vitals taken for this visit.    General appearance: alert, cooperative and appears stated age   Pelvic: External genitalia:  no lesions              Urethra:  normal appearing urethra with no masses, tenderness or lesions              Bartholins and Skenes: normal                 Vagina: normal appearing vagina with normal color and discharge, no lesions              Cervix: no cervical motion tenderness, no lesions and bleeding is moderate to heavy              Bimanual Exam:  Uterus:  normal size, contour, position, consistency, mobility, non-tender and anteverted              Adnexa: no mass, fullness, tenderness               Chaperone was present for exam.  ASSESSMENT Anovulatory bleed, now with heavy bleeding after provera W/D. She denies systemic symptoms. Moderate to heavy flow on exam She had a negative endometrial biopsy in 10/18    PLAN hgb 10.7, down from 11.2, patient has been on iron for several weeks (since last hgb). We discussed that since she has been bleeding heavily since yesterday, that I expect her hgb to drop further in the next few days. We also discussed that the provera should empty her uterine cavity and the bleeding should stop, I just don't know when that will be. Orthostatic vitals checked and normal DiscussedD&C vs close f/u Will return tomorrow for repeat exam and hgb She is aware that if she is saturating more than a pad an hour or is feeling light headed or dizzy that she needs to be seen in the ER (shouldn't drive herself if dizzy)   An After Visit Summary was printed and given to the patient.  Over 15 minutes face to face time of which over 50%  was spent in  counseling.

## 2018-02-18 NOTE — Telephone Encounter (Signed)
Left message to call Ahmad Vanwey at [phone].  

## 2018-02-18 NOTE — Telephone Encounter (Signed)
Patient called requesting to speak with the nurse. She said she is bleeding through a pad more than every two hours.  Last seen: 01/28/18

## 2018-02-18 NOTE — Telephone Encounter (Signed)
Spoke with patient. Completed provera 8/24, bleeding never completely stopped with provera. Heavy bleeding on 8/26, saturated pad and clothing x2, has gotten up multiple times throughput night to change overnight pad. Changing pad approximately q 1.5 hrs. Reports cramping. Denies SOB, fatigue, lightheadedness, weakness. Advised I will review with Dr. Oscar LaJertson and return call. Patient agreeable.   Dr. Oscar LaJertson -please advise.

## 2018-02-19 ENCOUNTER — Encounter: Payer: Self-pay | Admitting: Obstetrics and Gynecology

## 2018-02-19 ENCOUNTER — Ambulatory Visit (INDEPENDENT_AMBULATORY_CARE_PROVIDER_SITE_OTHER): Payer: BLUE CROSS/BLUE SHIELD | Admitting: Obstetrics and Gynecology

## 2018-02-19 VITALS — BP 142/74 | HR 85 | Ht 65.0 in | Wt 324.0 lb

## 2018-02-19 DIAGNOSIS — N939 Abnormal uterine and vaginal bleeding, unspecified: Secondary | ICD-10-CM | POA: Diagnosis not present

## 2018-02-19 DIAGNOSIS — D500 Iron deficiency anemia secondary to blood loss (chronic): Secondary | ICD-10-CM

## 2018-02-19 LAB — POCT HEMOGLOBIN: Hemoglobin: 10.8 g/dL — AB (ref 12.2–16.2)

## 2018-02-19 LAB — CBC
HEMOGLOBIN: 10.8 g/dL — AB (ref 11.1–15.9)
Hematocrit: 32.6 % — ABNORMAL LOW (ref 34.0–46.6)
MCH: 26.5 pg — ABNORMAL LOW (ref 26.6–33.0)
MCHC: 33.1 g/dL (ref 31.5–35.7)
MCV: 80 fL (ref 79–97)
Platelets: 466 10*3/uL — ABNORMAL HIGH (ref 150–450)
RBC: 4.07 x10E6/uL (ref 3.77–5.28)
RDW: 15.9 % — ABNORMAL HIGH (ref 12.3–15.4)
WBC: 9.1 10*3/uL (ref 3.4–10.8)

## 2018-02-19 LAB — FERRITIN: FERRITIN: 22 ng/mL (ref 15–150)

## 2018-02-19 LAB — HEMOGLOBIN: HEMOGLOBIN: 9.7

## 2018-02-19 LAB — POCT URINE PREGNANCY: Preg Test, Ur: NEGATIVE

## 2018-02-19 NOTE — Progress Notes (Signed)
GYNECOLOGY  VISIT   HPI: 32 y.o.   Single  African American  female   G0P0000 with No LMP recorded. (Menstrual status: Irregular Periods).   here for abnormal uterine bleeding. The patient had been having heavy bleeding since stopping her provera a few days ago. This follows months of anovulatory bleeding.  Yesterday her hgb was 10.8, her vitals were normal. She declined D&C, wanted to wait. She started ibuprofen yesterday.  Her bleeding is much lighter today, she has had the same pad on for 2 hours with only light bleeding.   GYNECOLOGIC HISTORY: No LMP recorded. (Menstrual status: Irregular Periods). Contraception none Menopausal hormone therapy: none        OB History    Gravida  0   Para  0   Term  0   Preterm  0   AB  0   Living  0     SAB  0   TAB  0   Ectopic  0   Multiple  0   Live Births                 Patient Active Problem List   Diagnosis Date Noted  . PCOS (polycystic ovarian syndrome) 01/09/2013  . Oligomenorrhea 12/12/2012    Past Medical History:  Diagnosis Date  . Allergy   . Diabetes mellitus without complication ([place]) 3/54/65   no meds at this time  . GERD (gastroesophageal reflux disease)   . PCOS (polycystic ovarian syndrome)   . Sleep apnea     Past Surgical History:  Procedure Laterality Date  . WISDOM TOOTH EXTRACTION      Current Outpatient Medications  Medication Sig Dispense Refill  . Cholecalciferol (VITAMIN D) 2000 units CAPS Take 2 capsules by mouth daily.    . medroxyPROGESTERone (PROVERA) 10 MG tablet 1 tablet BID until bleeding stops, then take one tablet a day for 10 days 20 tablet 1  . metFORMIN (GLUCOPHAGE-XR) 500 MG 24 hr tablet Take 1 tablet by mouth at bedtime.    . Multiple Vitamin (MULTIVITAMIN) tablet Take 1 tablet by mouth daily.     No current facility-administered medications for this visit.      ALLERGIES: Patient has no known allergies.  Family History  Problem Relation Age of Onset  .  Diabetes Mother   . Hypertension Mother   . Hypertension Maternal Grandmother   . Hyperlipidemia Maternal Grandmother   . Breast cancer Cousin 30       chemo is done and upcoming surgery - ?BRCA Negative    Social History   Socioeconomic History  . Marital status: Single    Spouse name: Not on file  . Number of children: Not on file  . Years of education: Not on file  . Highest education level: Not on file  Occupational History  . 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 Financial resource strain: Not on file  . Food insecurity:    Worry: Not on file    Inability: Not on file  . Transportation needs:    Medical: Not on file    Non-medical: Not on file  Tobacco Use  . Smoking status: Never Smoker  . Smokeless tobacco: Never Used  Substance and Sexual Activity  . Alcohol use: No  . Drug use: No  . Sexual activity: Not Currently    Partners: Male    Birth control/protection: None  Lifestyle  . Physical activity:  Days per week: Not on file    Minutes per session: Not on file  . Stress: Not on file  Relationships  . Social connections:    Talks on phone: Not on file    Gets together: Not on file    Attends religious service: Not on file    Active member of club or organization: Not on file    Attends meetings of clubs or organizations: Not on file    Relationship status: Not on file  . Intimate partner violence:    Fear of current or ex partner: Not on file    Emotionally abused: Not on file    Physically abused: Not on file    Forced sexual activity: Not on file  Other Topics Concern  . Not on file  Social History Narrative  . Not on file    Review of Systems  Constitutional: Negative.   HENT: Negative.   Eyes: Negative.   Respiratory: Negative.   Cardiovascular: Negative.   Gastrointestinal: Negative.   Genitourinary: Negative.        Abnormal uterine bleeding  Musculoskeletal: Negative.   Skin: Negative.    Neurological: Negative.   Endo/Heme/Allergies: Negative.   Psychiatric/Behavioral: Negative.   All other systems reviewed and are negative.   PHYSICAL EXAMINATION:    BP (!) 142/74   Pulse 85   Ht 5' 5" (1.651 m)   Wt (!) 324 lb (147 kg)   BMI 53.92 kg/m     General appearance: alert, cooperative and appears stated age   Pelvic: External genitalia:  no lesions              Urethra:  normal appearing urethra with no masses, tenderness or lesions              Bartholins and Skenes: normal                 Vagina: normal appearing vagina with normal color and discharge, no lesions              Cervix: no lesions, bleeding is currently light  Chaperone was present for exam.  ASSESSMENT Heavy bleeding after taking the provera, has slowed down today.    PLAN Hgb 9.7 today, down from 10.8 yesterday She will take ibuprofen around the clock for the next few days She will call if her bleeding picks up, or if she feels light headed or dizzy F/U tomorrow for repeat hgb and office visit Continue iron   An After Visit Summary was printed and given to the patient.   

## 2018-02-20 ENCOUNTER — Encounter: Payer: Self-pay | Admitting: Obstetrics and Gynecology

## 2018-02-20 ENCOUNTER — Ambulatory Visit (INDEPENDENT_AMBULATORY_CARE_PROVIDER_SITE_OTHER): Payer: BLUE CROSS/BLUE SHIELD | Admitting: Obstetrics and Gynecology

## 2018-02-20 VITALS — BP 155/80 | HR 80 | Wt 326.2 lb

## 2018-02-20 DIAGNOSIS — D500 Iron deficiency anemia secondary to blood loss (chronic): Secondary | ICD-10-CM | POA: Diagnosis not present

## 2018-02-20 DIAGNOSIS — N939 Abnormal uterine and vaginal bleeding, unspecified: Secondary | ICD-10-CM | POA: Diagnosis not present

## 2018-02-20 LAB — POCT HEMOGLOBIN: Hemoglobin: 10.1 g/dL — AB (ref 12.2–16.2)

## 2018-02-20 MED ORDER — MEDROXYPROGESTERONE ACETATE 5 MG PO TABS: ORAL_TABLET | ORAL | 1 refills | Status: DC

## 2018-02-20 NOTE — Progress Notes (Signed)
GYNECOLOGY  VISIT   HPI: 32 y.o.   Single  African American  female   G0P0000 with No LMP recorded. (Menstrual status: Irregular Periods).   here for abnormal uterine bleeding.   Her bleeding has slowed down tremendously, feeling better.  She is getting married in 2/20, won't be sexually active until after her wedding.   GYNECOLOGIC HISTORY: No LMP recorded. (Menstrual status: Irregular Periods). Contraception:none Menopausal hormone therapy: none        OB History    Gravida  0   Para  0   Term  0   Preterm  0   AB  0   Living  0     SAB  0   TAB  0   Ectopic  0   Multiple  0   Live Births                 Patient Active Problem List   Diagnosis Date Noted  . PCOS (polycystic ovarian syndrome) 01/09/2013  . Oligomenorrhea 12/12/2012    Past Medical History:  Diagnosis Date  . Allergy   . Diabetes mellitus without complication ([place]) 7/54/49   no meds at this time  . GERD (gastroesophageal reflux disease)   . PCOS (polycystic ovarian syndrome)   . Sleep apnea     Past Surgical History:  Procedure Laterality Date  . WISDOM TOOTH EXTRACTION      Current Outpatient Medications  Medication Sig Dispense Refill  . Cholecalciferol (VITAMIN D) 2000 units CAPS Take 2 capsules by mouth daily.    . metFORMIN (GLUCOPHAGE-XR) 500 MG 24 hr tablet Take 1 tablet by mouth at bedtime.    . Multiple Vitamin (MULTIVITAMIN) tablet Take 1 tablet by mouth daily.     No current facility-administered medications for this visit.      ALLERGIES: Patient has no known allergies.  Family History  Problem Relation Age of Onset  . Diabetes Mother   . Hypertension Mother   . Hypertension Maternal Grandmother   . Hyperlipidemia Maternal Grandmother   . Breast cancer Cousin 30       chemo is done and upcoming surgery - ?BRCA Negative    Social History   Socioeconomic History  . Marital status: Single    Spouse name: Not on file  . Number of children: Not on file   . Years of education: Not on file  . Highest education level: Not on file  Occupational History  . 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 Financial resource strain: Not on file  . Food insecurity:    Worry: Not on file    Inability: Not on file  . Transportation needs:    Medical: Not on file    Non-medical: Not on file  Tobacco Use  . Smoking status: Never Smoker  . Smokeless tobacco: Never Used  Substance and Sexual Activity  . Alcohol use: No  . Drug use: No  . Sexual activity: Not Currently    Partners: Male    Birth control/protection: None  Lifestyle  . Physical activity:    Days per week: Not on file    Minutes per session: Not on file  . Stress: Not on file  Relationships  . Social connections:    Talks on phone: Not on file    Gets together: Not on file    Attends religious service: Not on file    Active member of club or organization: Not  on file    Attends meetings of clubs or organizations: Not on file    Relationship status: Not on file  . Intimate partner violence:    Fear of current or ex partner: Not on file    Emotionally abused: Not on file    Physically abused: Not on file    Forced sexual activity: Not on file  Other Topics Concern  . Not on file  Social History Narrative  . Not on file    Review of Systems  Constitutional: Negative.   HENT: Negative.   Eyes: Negative.   Respiratory: Negative.   Cardiovascular: Negative.   Gastrointestinal: Negative.   Genitourinary:       Abnormal uterine bleeding  Musculoskeletal: Negative.   Skin: Negative.   Neurological: Negative.   Endo/Heme/Allergies: Negative.   Psychiatric/Behavioral: Negative.   All other systems reviewed and are negative.   PHYSICAL EXAMINATION:    BP (!) 155/80   Pulse 80   Wt (!) 326 lb 3.2 oz (148 kg)   BMI 54.28 kg/m     General appearance: alert, cooperative and appears stated age  ASSESSMENT Heavy bleeding after  provera W/D. Bleeding is now very light Long anovulatory bleed    PLAN Hgb 10.1, up from 9.7 yesterday She has an annual exam in 9/19 Will give her a script for cyclic provera. We discussed that weight loss would help her cycles We discussed that she needs to use contraception for 2 weeks prior to taking the provera and have a negative UPT (when active)   An After Visit Summary was printed and given to the patient.

## 2018-02-20 NOTE — Addendum Note (Signed)
Addended by: Joeseph AmorFAST, Shambria Camerer L on: 02/20/2018 04:50 PM   Modules accepted: Orders

## 2018-02-25 LAB — HEMOGLOBIN

## 2018-02-27 ENCOUNTER — Ambulatory Visit: Payer: BLUE CROSS/BLUE SHIELD | Admitting: Obstetrics and Gynecology

## 2018-03-03 DIAGNOSIS — G4733 Obstructive sleep apnea (adult) (pediatric): Secondary | ICD-10-CM | POA: Diagnosis not present

## 2018-03-09 DIAGNOSIS — J069 Acute upper respiratory infection, unspecified: Secondary | ICD-10-CM | POA: Diagnosis not present

## 2018-03-10 NOTE — Progress Notes (Deleted)
32 y.o. G0P0000 Single Black or African American Not Hispanic or Latino female here for annual exam.      No LMP recorded. (Menstrual status: Irregular Periods).          Sexually active: {yes no:314532}  The current method of family planning is {contraception:315051}.    Exercising: {yes no:314532}  {types:19826} Smoker:  {YES [id number]  Health Maintenance: Pap:  11/16/2014 normal History of abnormal Pap:  {YES NO:22349} MMG:  03/15/2017 BI-RADS CATEGORY  1: Negative. BMD:   n/a Colonoscopy: n/a TDaP:  2011 Gardasil: ***   reports that she has never smoked. She has never used smokeless tobacco. She reports that she does not drink alcohol or use drugs.  Past Medical History:  Diagnosis Date  . Allergy   . Diabetes mellitus without complication ([place]) 01/19/60   no meds at this time  . GERD (gastroesophageal reflux disease)   . PCOS (polycystic ovarian syndrome)   . Sleep apnea     Past Surgical History:  Procedure Laterality Date  . WISDOM TOOTH EXTRACTION      Current Outpatient Medications  Medication Sig Dispense Refill  . Cholecalciferol (VITAMIN D) 2000 units CAPS Take 2 capsules by mouth daily.    . ferrous sulfate 325 (65 FE) MG tablet Take 325 mg by mouth daily with breakfast.    . medroxyPROGESTERone (PROVERA) 5 MG tablet Take one tablet a day for 5 days starting on 04/03/18 and every other month if no spontaneous menses. 15 tablet 1  . metFORMIN (GLUCOPHAGE-XR) 500 MG 24 hr tablet Take 1 tablet by mouth at bedtime.    . Multiple Vitamin (MULTIVITAMIN) tablet Take 1 tablet by mouth daily.     No current facility-administered medications for this visit.     Family History  Problem Relation Age of Onset  . Diabetes Mother   . Hypertension Mother   . Hypertension Maternal Grandmother   . Hyperlipidemia Maternal Grandmother   . Breast cancer Cousin 30       chemo is done and upcoming surgery - ?BRCA Negative    Review of Systems  Constitutional: Negative.    HENT: Negative.   Eyes: Negative.   Respiratory: Negative.   Cardiovascular: Negative.   Gastrointestinal: Negative.   Endocrine: Negative.   Genitourinary: Negative.   Musculoskeletal: Negative.   Skin: Negative.   Allergic/Immunologic: Negative.   Neurological: Negative.   Hematological: Negative.   Psychiatric/Behavioral: Negative.   All other systems reviewed and are negative.   Exam:   There were no vitals taken for this visit.  Weight change: @WEIGHTCHANGE @ Height:      Ht Readings from Last 3 Encounters:  02/19/18 5' 5"  (1.651 m)  02/18/18 5' 5"  (1.651 m)  01/28/18 5' 5"  (1.651 m)    General appearance: alert, cooperative and appears stated age Head: Normocephalic, without obvious abnormality, atraumatic Neck: no adenopathy, supple, symmetrical, trachea midline and thyroid {CHL AMB PHY EX THYROID NORM DEFAULT:[phone]::"normal to inspection and palpation"} Lungs: clear to auscultation bilaterally Cardiovascular: regular rate and rhythm Breasts: {Exam; breast:13139::"normal appearance, no masses or tenderness"} Abdomen: soft, non-tender; non distended,  no masses,  no organomegaly Extremities: extremities normal, atraumatic, no cyanosis or edema Skin: Skin color, texture, turgor normal. No rashes or lesions Lymph nodes: Cervical, supraclavicular, and axillary nodes normal. No abnormal inguinal nodes palpated Neurologic: Grossly normal   Pelvic: External genitalia:  no lesions              Urethra:  normal appearing urethra with no  masses, tenderness or lesions              Bartholins and Skenes: normal                 Vagina: normal appearing vagina with normal color and discharge, no lesions              Cervix: {CHL AMB PHY EX CERVIX NORM DEFAULT:[phone]::"no lesions"}               Bimanual Exam:  Uterus:  {CHL AMB PHY EX UTERUS NORM [phone]::"normal size, contour, position, consistency, mobility, non-tender"}              Adnexa: {CHL AMB PHY  EX ADNEXA NO MASS DEFAULT:[phone]::"no mass, fullness, tenderness"}               Rectovaginal: Confirms               Anus:  normal sphincter tone, no lesions  Chaperone was present for exam.  A:  Well Woman with normal exam  P:

## 2018-03-13 ENCOUNTER — Ambulatory Visit: Payer: BLUE CROSS/BLUE SHIELD | Admitting: Obstetrics and Gynecology

## 2018-03-24 NOTE — Progress Notes (Signed)
32 y.o. G0P0000 Single Black or African American Not Hispanic or Latino female here for annual exam.  The patient has a h/o oligomenorrhea with a benign endometrial biopsy, normal TSH/prolacitn last year. This summer she was seen c/o bleeding for 6 months. She was anemic, treated with provera and iron. She was given a script for cyclic provera, due to start at the end of next week. No bleeding since her last visit. Not lightheaded or dizzy.  Not sexually active, waiting for her wedding next year (08/23/18) Period Duration (Days): weeks at a time Period Pattern: (!) Irregular Menstrual Flow: Moderate Menstrual Control: Maxi pad, Thin pad Menstrual Control Change Freq (Hours): changes pad every 2 hours Dysmenorrhea: (!) Moderate Dysmenorrhea Symptoms: Cramping   She see's her primary MD, HgbA1C was 7 in July, 2019  No LMP recorded. (Menstrual status: Irregular Periods).      August 2019 patient is unsure of date.    Sexually active: No.  The current method of family planning is none.     Exercising: No.  The patient does not participate in regular exercise at present. Smoker:  no  Health Maintenance: Pap:  11/16/2014 normal with negative HPV History of abnormal Pap:  no MMG:  03/15/2017 bilateral diagnostic mammogram with right breast ultrasound birads 1 negative TDaP:  10/21/2009 Gardasil: None, declines.    reports that she has never smoked. She has never used smokeless tobacco. She reports that she does not drink alcohol or use drugs.  Past Medical History:  Diagnosis Date  . Allergy   . Diabetes mellitus without complication ([place]) 1/66/06   no meds at this time  . GERD (gastroesophageal reflux disease)   . PCOS (polycystic ovarian syndrome)   . Sleep apnea     Past Surgical History:  Procedure Laterality Date  . WISDOM TOOTH EXTRACTION      Current Outpatient Medications  Medication Sig Dispense Refill  . medroxyPROGESTERone (PROVERA) 5 MG tablet Take one tablet a day for 5  days starting on 04/03/18 and every other month if no spontaneous menses. 15 tablet 1  . metFORMIN (GLUCOPHAGE-XR) 500 MG 24 hr tablet Take 1 tablet by mouth at bedtime.    . Multiple Vitamin (MULTIVITAMIN) tablet Take 1 tablet by mouth daily.     No current facility-administered medications for this visit.     Family History  Problem Relation Age of Onset  . Diabetes Mother   . Hypertension Mother   . Hypertension Maternal Grandmother   . Hyperlipidemia Maternal Grandmother   . Breast cancer Cousin 30       chemo is done and upcoming surgery - ?BRCA Negative    Review of Systems  Constitutional: Negative.   HENT: Negative.   Eyes: Negative.   Respiratory: Negative.   Cardiovascular: Negative.   Gastrointestinal: Negative.   Endocrine: Negative.   Genitourinary: Negative.   Musculoskeletal: Negative.   Skin: Negative.   Allergic/Immunologic: Negative.   Neurological: Negative.   Hematological: Negative.   Psychiatric/Behavioral: Negative.     Exam:     Weight change: _0 @ Height:   Height: 5' 5.35" (166 cm) Weight 322.2, BP 144/82, repeat 132/74, pulse 80 Ht Readings from Last 3 Encounters:  03/26/18 5' 5.35" (1.66 m)  02/19/18 5' 5" (1.651 m)  02/18/18 5' 5" (1.651 m)    General appearance: alert, cooperative and appears stated age Head: Normocephalic, without obvious abnormality, atraumatic Neck: no adenopathy, supple, symmetrical, trachea midline and thyroid normal to inspection and palpation Lungs: clear to  auscultation bilaterally Cardiovascular: regular rate and rhythm Breasts: normal appearance, no masses or tenderness Abdomen: soft, non-tender; non distended,  no masses,  no organomegaly Extremities: extremities normal, atraumatic, no cyanosis or edema Skin: Skin color, texture, turgor normal. No rashes or lesions Lymph nodes: Cervical, supraclavicular, and axillary nodes normal. No abnormal inguinal nodes palpated Neurologic: Grossly  normal   Pelvic: External genitalia:  no lesions              Urethra:  normal appearing urethra with no masses, tenderness or lesions              Bartholins and Skenes: normal                 Vagina: normal appearing vagina with normal color and discharge, no lesions              Cervix: no lesions               Bimanual Exam:  Uterus:  normal size, contour, position, consistency, mobility, non-tender and anteverted              Adnexa: no mass, fullness, tenderness               Rectovaginal: Confirms               Anus:  normal sphincter tone, no lesions  Chaperone was present for exam.  A:  Well Woman with normal exam  H/O oligomenorrhea, anovulatory bleeding this year. Negative evaluation  H/O anemia  P:   Pap with hpv  CBC, Ferritin  Other labs with primary  Script for provera sent, she will take it cyclically  Discussed breast self exam  Discussed calcium and vit D intake  Wants to get pregnant after she gets married. Recommended she attempt weight loss. # for Dr Leafy Ro given

## 2018-03-26 ENCOUNTER — Encounter: Payer: Self-pay | Admitting: Obstetrics and Gynecology

## 2018-03-26 ENCOUNTER — Ambulatory Visit (INDEPENDENT_AMBULATORY_CARE_PROVIDER_SITE_OTHER): Payer: BLUE CROSS/BLUE SHIELD | Admitting: Obstetrics and Gynecology

## 2018-03-26 ENCOUNTER — Other Ambulatory Visit: Payer: Self-pay

## 2018-03-26 VITALS — BP 144/82 | HR 80 | Ht 65.35 in | Wt 322.2 lb

## 2018-03-26 DIAGNOSIS — Z862 Personal history of diseases of the blood and blood-forming organs and certain disorders involving the immune mechanism: Secondary | ICD-10-CM | POA: Diagnosis not present

## 2018-03-26 DIAGNOSIS — N914 Secondary oligomenorrhea: Secondary | ICD-10-CM | POA: Diagnosis not present

## 2018-03-26 DIAGNOSIS — Z01419 Encounter for gynecological examination (general) (routine) without abnormal findings: Secondary | ICD-10-CM | POA: Diagnosis not present

## 2018-03-26 DIAGNOSIS — Z124 Encounter for screening for malignant neoplasm of cervix: Secondary | ICD-10-CM

## 2018-03-26 MED ORDER — MEDROXYPROGESTERONE ACETATE 5 MG PO TABS: ORAL_TABLET | ORAL | 1 refills | Status: DC

## 2018-03-26 NOTE — Patient Instructions (Signed)
EXERCISE AND DIET:  We recommended that you start or continue a regular exercise program for good health. Regular exercise means any activity that makes your heart beat faster and makes you sweat.  We recommend exercising at least 30 minutes per day at least 3 days a week, preferably 4 or 5.  We also recommend a diet low in fat and sugar.  Inactivity, poor dietary choices and obesity can cause diabetes, heart attack, stroke, and kidney damage, among others.    ALCOHOL AND SMOKING:  Women should limit their alcohol intake to no more than 7 drinks/beers/glasses of wine (combined, not each!) per week. Moderation of alcohol intake to this level decreases your risk of breast cancer and liver damage. And of course, no recreational drugs are part of a healthy lifestyle.  And absolutely no smoking or even second hand smoke. Most people know smoking can cause heart and lung diseases, but did you know it also contributes to weakening of your bones? Aging of your skin?  Yellowing of your teeth and nails?  CALCIUM AND VITAMIN D:  Adequate intake of calcium and Vitamin D are recommended.  The recommendations for exact amounts of these supplements seem to change often, but generally speaking 600 mg of calcium (either carbonate or citrate) and 800 units of Vitamin D per day seems prudent. Certain women may benefit from higher intake of Vitamin D.  If you are among these women, your doctor will have told you during your visit.    PAP SMEARS:  Pap smears, to check for cervical cancer or precancers,  have traditionally been done yearly, although recent scientific advances have shown that most women can have pap smears less often.  However, every woman still should have a physical exam from her gynecologist every year. It will include a breast check, inspection of the vulva and vagina to check for abnormal growths or skin changes, a visual exam of the cervix, and then an exam to evaluate the size and shape of the uterus and  ovaries.  And after 34 years of age, a rectal exam is indicated to check for rectal cancers. We will also provide age appropriate advice regarding health maintenance, like when you should have certain vaccines, screening for sexually transmitted diseases, bone density testing, colonoscopy, mammograms, etc.   MAMMOGRAMS:  All women over 40 years old should have a yearly mammogram. Many facilities now offer a "3D" mammogram, which may cost around $50 extra out of pocket. If possible,  we recommend you accept the option to have the 3D mammogram performed.  It both reduces the number of women who will be called back for extra views which then turn out to be normal, and it is better than the routine mammogram at detecting truly abnormal areas.    COLONOSCOPY:  Colonoscopy to screen for colon cancer is recommended for all women at age 50.  We know, you hate the idea of the prep.  We agree, BUT, having colon cancer and not knowing it is worse!!  Colon cancer so often starts as a polyp that can be seen and removed at colonscopy, which can quite literally save your life!  And if your first colonoscopy is normal and you have no family history of colon cancer, most women don't have to have it again for 10 years.  Once every ten years, you can do something that may end up saving your life, right?  We will be happy to help you get it scheduled when you are ready.    Be sure to check your insurance coverage so you understand how much it will cost.  It may be covered as a preventative service at no cost, but you should check your particular policy.      Breast Self-Awareness Breast self-awareness means being familiar with how your breasts look and feel. It involves checking your breasts regularly and reporting any changes to your health care provider. Practicing breast self-awareness is important. A change in your breasts can be a sign of a serious medical problem. Being familiar with how your breasts look and feel allows  you to find any problems early, when treatment is more likely to be successful. All women should practice breast self-awareness, including women who have had breast implants. How to do a breast self-exam One way to learn what is normal for your breasts and whether your breasts are changing is to do a breast self-exam. To do a breast self-exam: Look for Changes  1. Remove all the clothing above your waist. 2. Stand in front of a mirror in a room with good lighting. 3. Put your hands on your hips. 4. Push your hands firmly downward. 5. Compare your breasts in the mirror. Look for differences between them (asymmetry), such as: ? Differences in shape. ? Differences in size. ? Puckers, dips, and bumps in one breast and not the other. 6. Look at each breast for changes in your skin, such as: ? Redness. ? Scaly areas. 7. Look for changes in your nipples, such as: ? Discharge. ? Bleeding. ? Dimpling. ? Redness. ? A change in position. Feel for Changes  Carefully feel your breasts for lumps and changes. It is best to do this while lying on your back on the floor and again while sitting or standing in the shower or tub with soapy water on your skin. Feel each breast in the following way:  Place the arm on the side of the breast you are examining above your head.  Feel your breast with the other hand.  Start in the nipple area and make  inch (2 cm) overlapping circles to feel your breast. Use the pads of your three middle fingers to do this. Apply light pressure, then medium pressure, then firm pressure. The light pressure will allow you to feel the tissue closest to the skin. The medium pressure will allow you to feel the tissue that is a little deeper. The firm pressure will allow you to feel the tissue close to the ribs.  Continue the overlapping circles, moving downward over the breast until you feel your ribs below your breast.  Move one finger-width toward the center of the body.  Continue to use the  inch (2 cm) overlapping circles to feel your breast as you move slowly up toward your collarbone.  Continue the up and down exam using all three pressures until you reach your armpit.  Write Down What You Find  Write down what is normal for each breast and any changes that you find. Keep a written record with breast changes or normal findings for each breast. By writing this information down, you do not need to depend only on memory for size, tenderness, or location. Write down where you are in your menstrual cycle, if you are still menstruating. If you are having trouble noticing differences in your breasts, do not get discouraged. With time you will become more familiar with the variations in your breasts and more comfortable with the exam. How often should I examine my breasts? Examine   your breasts every month. If you are breastfeeding, the best time to examine your breasts is after a feeding or after using a breast pump. If you menstruate, the best time to examine your breasts is 5-7 days after your period is over. During your period, your breasts are lumpier, and it may be more difficult to notice changes. When should I see my health care provider? See your health care provider if you notice:  A change in shape or size of your breasts or nipples.  A change in the skin of your breast or nipples, such as a reddened or scaly area.  Unusual discharge from your nipples.  A lump or thick area that was not there before.  Pain in your breasts.  Anything that concerns you.  This information is not intended to replace advice given to you by your health care provider. Make sure you discuss any questions you have with your health care provider. Document Released: 06/11/2005 Document Revised: 11/17/2015 Document Reviewed: 05/01/2015 Elsevier Interactive Patient Education  2018 [employer]  

## 2018-03-27 ENCOUNTER — Other Ambulatory Visit (HOSPITAL_COMMUNITY)
Admission: RE | Admit: 2018-03-27 | Discharge: 2018-03-27 | Disposition: A | Discharge status: Home / Self Care | Payer: BLUE CROSS/BLUE SHIELD | Source: Ambulatory Visit | Attending: Obstetrics and Gynecology | Admitting: Obstetrics and Gynecology

## 2018-03-27 DIAGNOSIS — Z124 Encounter for screening for malignant neoplasm of cervix: Secondary | ICD-10-CM | POA: Insufficient documentation

## 2018-03-27 LAB — CBC
HEMATOCRIT: 34.9 % (ref 34.0–46.6)
HEMOGLOBIN: 11.2 g/dL (ref 11.1–15.9)
MCH: 25.7 pg — AB (ref 26.6–33.0)
MCHC: 32.1 g/dL (ref 31.5–35.7)
MCV: 80 fL (ref 79–97)
Platelets: 357 10*3/uL (ref 150–450)
RBC: 4.36 x10E6/uL (ref 3.77–5.28)
RDW: 14.9 % (ref 12.3–15.4)
WBC: 8.7 10*3/uL (ref 3.4–10.8)

## 2018-03-27 LAB — FERRITIN: Ferritin: 20 ng/mL (ref 15–150)

## 2018-03-27 NOTE — Addendum Note (Signed)
Addended by: Tobi Bastos on: 03/27/2018 05:28 PM   Modules accepted: Orders

## 2018-03-31 ENCOUNTER — Telehealth: Payer: Self-pay

## 2018-03-31 LAB — CYTOLOGY - PAP
DIAGNOSIS: NEGATIVE
HPV (WINDOPATH): NOT DETECTED

## 2018-03-31 NOTE — Telephone Encounter (Signed)
-----   Message from Romualdo Bolk, MD sent at 03/27/2018  4:35 PM EDT ----- Please inform the patient that she is no longer anemic. She still has low normal iron stores. I would recommend that she take OTC iron for the next month to build up her iron stores.

## 2018-03-31 NOTE — Telephone Encounter (Signed)
Spoke with patient. Results given. Patient verbalizes understanding. Encounter closed. 

## 2018-04-02 DIAGNOSIS — G4733 Obstructive sleep apnea (adult) (pediatric): Secondary | ICD-10-CM | POA: Diagnosis not present

## 2018-04-30 DIAGNOSIS — G4733 Obstructive sleep apnea (adult) (pediatric): Secondary | ICD-10-CM | POA: Diagnosis not present

## 2018-05-06 DIAGNOSIS — G4733 Obstructive sleep apnea (adult) (pediatric): Secondary | ICD-10-CM | POA: Diagnosis not present

## 2018-05-27 DIAGNOSIS — G4733 Obstructive sleep apnea (adult) (pediatric): Secondary | ICD-10-CM | POA: Diagnosis not present

## 2018-06-02 DIAGNOSIS — G4733 Obstructive sleep apnea (adult) (pediatric): Secondary | ICD-10-CM | POA: Diagnosis not present

## 2018-07-03 DIAGNOSIS — G4733 Obstructive sleep apnea (adult) (pediatric): Secondary | ICD-10-CM | POA: Diagnosis not present

## 2018-08-01 DIAGNOSIS — M25531 Pain in right wrist: Secondary | ICD-10-CM | POA: Diagnosis not present

## 2018-08-03 DIAGNOSIS — G4733 Obstructive sleep apnea (adult) (pediatric): Secondary | ICD-10-CM | POA: Diagnosis not present

## 2018-08-14 ENCOUNTER — Encounter (INDEPENDENT_AMBULATORY_CARE_PROVIDER_SITE_OTHER): Payer: BLUE CROSS/BLUE SHIELD

## 2018-08-18 ENCOUNTER — Encounter (INDEPENDENT_AMBULATORY_CARE_PROVIDER_SITE_OTHER): Payer: BLUE CROSS/BLUE SHIELD

## 2018-08-18 DIAGNOSIS — E113299 Type 2 diabetes mellitus with mild nonproliferative diabetic retinopathy without macular edema, unspecified eye: Secondary | ICD-10-CM | POA: Diagnosis not present

## 2018-08-18 NOTE — Progress Notes (Signed)
GYNECOLOGY  VISIT   HPI: 33 y.o.   Single Black or African American Not Hispanic or Latino  female   G0P0000 with Patient's last menstrual period was 07/22/2018 (exact date).   here for UTI symptoms. She c/o a one month h/o an increase in wetness, not sure if it is d/c or urine. Doesn't feel herself leak urine.  She c/o an increase in urinary frequency, but she is drinking large amounts of water. Voids normal amounts. She does have urgency to void in the last month. Drinks a little caffeine, has cut back. No dysuria. May have a slight vaginal odor.    GYNECOLOGIC HISTORY: Patient's last menstrual period was 07/22/2018 (exact date). Contraception: None Menopausal hormone therapy: None        OB History    Gravida  0   Para  0   Term  0   Preterm  0   AB  0   Living  0     SAB  0   TAB  0   Ectopic  0   Multiple  0   Live Births                 Patient Active Problem List   Diagnosis Date Noted  . PCOS (polycystic ovarian syndrome) 01/09/2013  . Oligomenorrhea 12/12/2012    Past Medical History:  Diagnosis Date  . Allergy   . Diabetes mellitus without complication ([place]) 02/17/99   no meds at this time  . GERD (gastroesophageal reflux disease)   . PCOS (polycystic ovarian syndrome)   . Sleep apnea     Past Surgical History:  Procedure Laterality Date  . WISDOM TOOTH EXTRACTION      Current Outpatient Medications  Medication Sig Dispense Refill  . metFORMIN (GLUCOPHAGE-XR) 500 MG 24 hr tablet Take 1 tablet by mouth at bedtime.    . Multiple Vitamin (MULTIVITAMIN) tablet Take 1 tablet by mouth daily.    . medroxyPROGESTERone (PROVERA) 5 MG tablet Take one tablet a day for 5 days starting on 04/03/18 and every other month if no spontaneous menses. (Patient not taking: Reported on 08/19/2018) 15 tablet 1   No current facility-administered medications for this visit.      ALLERGIES: Patient has no known allergies.  Family History  Problem Relation Age  of Onset  . Diabetes Mother   . Hypertension Mother   . Hypertension Maternal Grandmother   . Hyperlipidemia Maternal Grandmother   . Breast cancer Cousin 30       chemo is done and upcoming surgery - ?BRCA Negative    Social History   Socioeconomic History  . Marital status: Single    Spouse name: Not on file  . Number of children: Not on file  . Years of education: Not on file  . Highest education level: Not on file  Occupational History  . 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 Financial resource strain: Not on file  . Food insecurity:    Worry: Not on file    Inability: Not on file  . Transportation needs:    Medical: Not on file    Non-medical: Not on file  Tobacco Use  . Smoking status: Never Smoker  . Smokeless tobacco: Never Used  Substance and Sexual Activity  . Alcohol use: No  . Drug use: No  . Sexual activity: Not Currently    Partners: Male    Birth control/protection: None  Lifestyle  .  Physical activity:    Days per week: Not on file    Minutes per session: Not on file  . Stress: Not on file  Relationships  . Social connections:    Talks on phone: Not on file    Gets together: Not on file    Attends religious service: Not on file    Active member of club or organization: Not on file    Attends meetings of clubs or organizations: Not on file    Relationship status: Not on file  . Intimate partner violence:    Fear of current or ex partner: Not on file    Emotionally abused: Not on file    Physically abused: Not on file    Forced sexual activity: Not on file  Other Topics Concern  . Not on file  Social History Narrative  . Not on file    Review of Systems  Constitutional: Negative.   HENT: Negative.   Eyes: Negative.   Respiratory: Negative.   Cardiovascular: Negative.   Gastrointestinal: Negative.   Genitourinary: Positive for frequency.       Loss of urine spontaneously  Musculoskeletal:  Negative.   Skin: Negative.   Neurological: Negative.   Endo/Heme/Allergies: Negative.   Psychiatric/Behavioral: Negative.     PHYSICAL EXAMINATION:    BP (!) 144/82 (BP Location: Right Arm, Patient Position: Sitting, Cuff Size: Large)   Pulse 84   Wt (!) 325 lb (147.4 kg)   LMP 07/22/2018 (Exact Date)   BMI 53.50 kg/m     General appearance: alert, cooperative and appears stated age  Pelvic: External genitalia:  no lesions              Urethra:  normal appearing urethra with no masses, tenderness or lesions              Bartholins and Skenes: normal                 Vagina: normal appearing vagina with normal color and discharge, no lesions              Cervix:  no lesions  Chaperone was present for exam.  Urine dip: + blood, 2+ leuk  Wet prep: ? clue, no trich, rare wbc KOH: no yeast PH: 4.5   ASSESSMENT Urinary frequency and urgency Vaginal d/c vs urinary leakage    PLAN Send urine for ua, c&s Affirm sent Treatment depending of results   An After Visit Summary was printed and given to the patient.

## 2018-08-19 ENCOUNTER — Ambulatory Visit (INDEPENDENT_AMBULATORY_CARE_PROVIDER_SITE_OTHER): Payer: BLUE CROSS/BLUE SHIELD | Admitting: Obstetrics and Gynecology

## 2018-08-19 ENCOUNTER — Other Ambulatory Visit: Payer: Self-pay

## 2018-08-19 ENCOUNTER — Encounter: Payer: Self-pay | Admitting: Obstetrics and Gynecology

## 2018-08-19 VITALS — BP 144/82 | HR 84 | Wt 325.0 lb

## 2018-08-19 DIAGNOSIS — N898 Other specified noninflammatory disorders of vagina: Secondary | ICD-10-CM

## 2018-08-19 DIAGNOSIS — R350 Frequency of micturition: Secondary | ICD-10-CM | POA: Diagnosis not present

## 2018-08-19 DIAGNOSIS — R3915 Urgency of urination: Secondary | ICD-10-CM

## 2018-08-19 LAB — POCT URINALYSIS DIPSTICK
BILIRUBIN UA: NEGATIVE
Blood, UA: POSITIVE
GLUCOSE UA: NEGATIVE
KETONES UA: NEGATIVE
NITRITE UA: NEGATIVE
PROTEIN UA: NEGATIVE
Spec Grav, UA: 1.01 (ref 1.010–1.025)
Urobilinogen, UA: 0.2 E.U./dL
pH, UA: 7 (ref 5.0–8.0)

## 2018-08-20 ENCOUNTER — Ambulatory Visit (INDEPENDENT_AMBULATORY_CARE_PROVIDER_SITE_OTHER): Payer: BLUE CROSS/BLUE SHIELD | Admitting: Family Medicine

## 2018-08-20 LAB — URINE CULTURE: Organism ID, Bacteria: NO GROWTH

## 2018-08-20 LAB — VAGINITIS/VAGINOSIS, DNA PROBE
Candida Species: NEGATIVE
Gardnerella vaginalis: NEGATIVE
Trichomonas vaginosis: NEGATIVE

## 2018-08-21 LAB — URINALYSIS, MICROSCOPIC ONLY
Bacteria, UA: NONE SEEN
Casts: NONE SEEN /lpf

## 2018-08-26 ENCOUNTER — Ambulatory Visit (INDEPENDENT_AMBULATORY_CARE_PROVIDER_SITE_OTHER): Payer: BLUE CROSS/BLUE SHIELD | Admitting: Family Medicine

## 2018-08-26 ENCOUNTER — Encounter (INDEPENDENT_AMBULATORY_CARE_PROVIDER_SITE_OTHER): Payer: Self-pay | Admitting: Family Medicine

## 2018-08-26 VITALS — BP 130/82 | HR 62 | Ht 65.0 in | Wt 321.0 lb

## 2018-08-26 DIAGNOSIS — R0602 Shortness of breath: Secondary | ICD-10-CM

## 2018-08-26 DIAGNOSIS — R632 Polyphagia: Secondary | ICD-10-CM

## 2018-08-26 DIAGNOSIS — Z6841 Body Mass Index (BMI) 40.0 and over, adult: Secondary | ICD-10-CM

## 2018-08-26 DIAGNOSIS — E538 Deficiency of other specified B group vitamins: Secondary | ICD-10-CM

## 2018-08-26 DIAGNOSIS — E1165 Type 2 diabetes mellitus with hyperglycemia: Secondary | ICD-10-CM

## 2018-08-26 DIAGNOSIS — Z9189 Other specified personal risk factors, not elsewhere classified: Secondary | ICD-10-CM | POA: Diagnosis not present

## 2018-08-26 DIAGNOSIS — R5383 Other fatigue: Secondary | ICD-10-CM

## 2018-08-26 DIAGNOSIS — Z0289 Encounter for other administrative examinations: Secondary | ICD-10-CM

## 2018-08-26 DIAGNOSIS — Z1331 Encounter for screening for depression: Secondary | ICD-10-CM | POA: Diagnosis not present

## 2018-08-26 DIAGNOSIS — E559 Vitamin D deficiency, unspecified: Secondary | ICD-10-CM

## 2018-08-26 NOTE — Progress Notes (Signed)
.  Office: [phone]  /  Fax: [phone]   HPI:   Chief Complaint: OBESITY  Diane Padilla (MR# [id number]) is a 33 y.o. female who presents on 08/26/2018 for obesity evaluation and treatment. She states she was referred by her OB/GYN provider. Current BMI is Body mass index is 53.42 kg/m.Marland Kitchen Diane Padilla has struggled with obesity for years and has been unsuccessful in either losing weight or maintaining long term weight loss. Diane Padilla attended our information session and states she is currently in the action stage of change and ready to dedicate time achieving and maintaining a healthier weight.  Diane Padilla states her family eats meals together she thinks her family will eat healthier with  her she feels she may struggle with family and or coworkers weight loss sabotage as she and her husband are not fully on the same page about heathy eating. her desired weight loss is 100 lbs she has been heavy most of her life she started gaining weight once she got into a relationship and also stressed and depressed at her last job. her heaviest weight ever was 321 lbs. she has nighttime sweet food cravings issues  she snacks sometimes in the evenings she skips lunch occasionally she is frequently drinking liquids with calories she frequently makes poor food choices she has problems with excessive hunger  she frequently eats larger portions than normal  she has binge eating behaviors she struggles with emotional eating    Fatigue Diane Padilla feels her energy is lower than it should be. This has worsened with weight gain and has worsened recently. Diane Padilla admits to daytime somnolence and  admits to waking up still tired. Patient is at risk for obstructive sleep apnea. Patent has a history of symptoms of daytime fatigue, morning fatigue and morning headache. Patient generally gets 5-8 hours of sleep per night, and states they generally do not sleep well most nights. Snoring is present. Apneic episodes are  present. Epworth Sleepiness Score is 8.  Dyspnea on exertion Diane Padilla notes increasing shortness of breath with exercising and seems to be worsening over time with weight gain. She notes getting out of breath sooner with activity than she used to. This has gotten worse recently. Diane Padilla denies orthopnea.  At risk for cardiovascular disease Diane Padilla is at a higher than average risk for cardiovascular disease due to obesity. She currently denies any chest pain.  Diabetes II, uncontrolled with hyperglycemia Diane Padilla has a diagnosis of diabetes type II. Diane Padilla does not report checking her blood sugars and states her last A1c was elevated at 10.8 on metformin. She denies polyphagia. She does report that she eats a lot of simple carbs and has been working on intensive lifestyle modifications including diet, exercise, and weight loss to help control her blood glucose levels.  Vitamin D deficiency Diane Padilla has a diagnosis of Vitamin D deficiency. She is currently taking OTC Vit D and denies nausea, vomiting or muscle weakness but does report fatigue.  B12 Deficiency Diane Padilla has a diagnosis of B12 insufficiency and notes fatigue. She reports being on OTC B12.  Binge Eating Episodes Diane Padilla notes eating larger portions than average approximately 2-3 times per week for years and wonders if she has a binge eating disorder. She notes significant emotional eating with PHQ-9 score of 19.  Depression Screen Diane Padilla's Food and Mood (modified PHQ-9) score was 19.  Depression screen [name] 2/9 08/26/2018  Decreased Interest 2  Down, Depressed, Hopeless 3  PHQ - 2 Score 5  Altered sleeping 3  Tired, decreased  energy 3  Change in appetite 3  Feeling bad or failure about yourself  2  Trouble concentrating 1  Moving slowly or fidgety/restless 1  Suicidal thoughts 1  PHQ-9 Score 19  Difficult doing work/chores Somewhat difficult   ASSESSMENT AND PLAN:  Other fatigue - Plan: EKG 12-Lead, CBC With  Differential, T3, T4, free, TSH  Shortness of breath on exertion  Type 2 diabetes mellitus with hyperglycemia, without long-term current use of insulin (HCC) - Plan: Comprehensive metabolic panel, Hemoglobin A1c, Insulin, random, Lipid Panel With LDL/HDL Ratio  Vitamin D deficiency - Plan: VITAMIN D 25 Hydroxy (Vit-D Deficiency, Fractures)  B12 nutritional deficiency - Plan: Vitamin B12, Folate  Binge eating  Depression screening  At risk for heart disease  Class 3 severe obesity with serious comorbidity and body mass index (BMI) of 50.0 to 59.9 in adult, unspecified obesity type (HCC)  PLAN:  Fatigue Diane Padilla was informed that her fatigue may be related to obesity, depression or many other causes. Labs will be ordered, and in the meanwhile Diane Padilla has agreed to work on diet, exercise and weight loss to help with fatigue. Proper sleep hygiene was discussed including the need for 7-8 hours of quality sleep each night. A sleep study was not ordered based on symptoms and Epworth score.  Dyspnea on exertion [name]'s shortness of breath appears to be obesity related and exercise induced. She has agreed to work on weight loss and gradually increase exercise to treat her exercise induced shortness of breath. If Diane Padilla follows our instructions and loses weight without improvement of her shortness of breath, we will plan to refer to pulmonology. We will monitor this condition regularly. Diane Padilla agrees to this plan.  Cardiovascular risk counseling Diane Padilla was given extended (15 minutes) coronary artery disease prevention counseling today. She is 33 y.o. female and has risk factors for heart disease including obesity. We discussed intensive lifestyle modifications today with an emphasis on specific weight loss instructions and strategies. Pt was also informed of the importance of increasing exercise and decreasing saturated fats to help prevent heart disease.  Diabetes II Diane Padilla has been  given extensive diabetes education by myself today including ideal fasting and post-prandial blood glucose readings, individual ideal HgA1c goals  and hypoglycemia prevention. We discussed the importance of good blood sugar control to decrease the likelihood of diabetic complications such as nephropathy, neuropathy, limb loss, blindness, coronary artery disease, and death. We discussed the importance of intensive lifestyle modification including diet, exercise and weight loss as the first line treatment for diabetes. Diane Padilla agrees to have labs drawn today and start diet. DM education was given.   Vitamin D Deficiency Diane Padilla was informed that low Vitamin D levels contributes to fatigue and are associated with obesity, breast, and colon cancer. She agrees to continue to take OTC Vit D and will have routine testing of Vitamin D today. She was informed of the risk of over-replacement of Vitamin D and agrees to not increase her dose unless she discusses this with [place] first. Diane Padilla agrees to follow-up with our clinic in 2 weeks.  B12 Deficiency Diane Padilla will work on increasing B12 rich foods in her diet. B12 supplementation was not prescribed today. We will plan on checking labs today and will follow-up in 2 weeks.  Binge Eating Episodes Diane Padilla will see Dr. Mallie Mussel and we will follow.  Depression Screen Diane Padilla had a strongly positive depression screening. Depression is commonly associated with obesity and often results in emotional eating behaviors. We will monitor this  closely and work on CBT to help improve the non-hunger eating patterns. Patient was referred to Dr. Mallie Mussel, our bariatric psychologist, for evaluation due to elevated PHQ-9 score and significant struggles with emotional eating.  Obesity Diane Padilla is currently in the action stage of change and her goal is to continue with weight loss efforts She has agreed to follow the Category 3 plan. Diane Padilla has been instructed to work up to a  goal of 150 minutes of combined cardio and strengthening exercise per week for weight loss and overall health benefits. We discussed the following Behavioral Modification Strategies today: increasing lean protein intake, decreasing simple carbohydrates, work on meal planning and easy cooking plans, and dealing with family or coworker sabotage  Diane Padilla has agreed to follow-up with our clinic in 2 weeks to see [place] and in 2 weeks to see Dr. Mallie Mussel. She was informed of the importance of frequent follow up visits to maximize her success with intensive lifestyle modifications for her multiple health conditions. She was informed we would discuss her lab results at her next visit unless there is a critical issue that needs to be addressed sooner. Diane Padilla agreed to keep her next visit at the agreed upon time to discuss these results.  ALLERGIES: No Known Allergies  MEDICATIONS: Current Outpatient Medications on File Prior to Visit  Medication Sig Dispense Refill  . Cholecalciferol (VITAMIN D) 50 MCG (2000 UT) CAPS Take 1 capsule by mouth daily.    . diphenhydrAMINE (BENADRYL ALLERGY) 25 MG tablet Take 25 mg by mouth at bedtime as needed.    . diphenhydramine-acetaminophen (TYLENOL PM) 25-500 MG TABS tablet Take 1 tablet by mouth at bedtime as needed.    . meloxicam (MOBIC) 15 MG tablet Take 15 mg by mouth daily.    . metFORMIN (GLUCOPHAGE-XR) 500 MG 24 hr tablet Take by mouth. Take one tablet by mouth with breakfast and 2 tablets with evening meal    . Multiple Vitamin (MULTIVITAMIN) tablet Take 1 tablet by mouth daily.    . vitamin B-12 (CYANOCOBALAMIN) 1000 MCG tablet Take 1,000 mcg by mouth daily.    . vitamin C (ASCORBIC ACID) 500 MG tablet Take 500 mg by mouth daily.     No current facility-administered medications on file prior to visit.     PAST MEDICAL HISTORY: Past Medical History:  Diagnosis Date  . Allergy   . Back pain   . Depression   . Diabetes mellitus without complication ([place])  [birth year]   no meds at this time  . Dyspnea   . GERD (gastroesophageal reflux disease)   . PCOS (polycystic ovarian syndrome)   . Sleep apnea   . Vitamin D deficiency     PAST SURGICAL HISTORY: Past Surgical History:  Procedure Laterality Date  . WISDOM TOOTH EXTRACTION      SOCIAL HISTORY: Social History   Tobacco Use  . Smoking status: Never Smoker  . Smokeless tobacco: Never Used  Substance Use Topics  . Alcohol use: No  . Drug use: No    FAMILY HISTORY: Family History  Problem Relation Age of Onset  . Diabetes Mother   . Hypertension Mother   . Hyperlipidemia Mother   . Stroke Mother   . Cancer Mother   . Kidney disease Mother   . Sleep apnea Mother   . Drug abuse Mother   . Obesity Mother   . Hypertension Maternal Grandmother   . Hyperlipidemia Maternal Grandmother   . Drug abuse Father   . Breast cancer Cousin  30       chemo is done and upcoming surgery - ?BRCA Negative   ROS: Review of Systems  Constitutional: Positive for malaise/fatigue.  HENT:       Positive for nose stuffiness.  Eyes: Positive for blurred vision and double vision.       Positive for wearing glasses or contacts.  Respiratory: Positive for shortness of breath (with activity).   Genitourinary:       Positive for polyuria.  Skin: Positive for itching.       Positive for skin dryness.  Neurological: Positive for headaches.  Psychiatric/Behavioral: Positive for depression. The patient has insomnia.        Positive for stress.   PHYSICAL EXAM: Blood pressure 130/82, pulse 62, height 5' 5"  (1.651 m), weight (!) 321 lb (145.6 kg), last menstrual period 07/22/2018, SpO2 98 %. Body mass index is 53.42 kg/m. Physical Exam Vitals signs reviewed.  Constitutional:      Appearance: Normal appearance. She is well-developed. She is obese.  HENT:     Head: Normocephalic and atraumatic.     Nose: Nose normal.  Eyes:     General: No scleral icterus. Neck:     Musculoskeletal: Normal range  of motion.  Cardiovascular:     Rate and Rhythm: Normal rate and regular rhythm.  Pulmonary:     Effort: Pulmonary effort is normal. No respiratory distress.  Chest:     Comments: Positive for breast pain. Abdominal:     Palpations: Abdomen is soft.     Tenderness: There is no abdominal tenderness.  Musculoskeletal: Normal range of motion.     Comments: Range of motion normal in all four extremities.  Skin:    General: Skin is warm and dry.  Neurological:     Mental Status: She is alert and oriented to person, place, and time.     Coordination: Coordination normal.  Psychiatric:        Mood and Affect: Mood and affect normal.        Behavior: Behavior normal.   RECENT LABS AND TESTS: BMET    Component Value Date/Time   NA 135 02/19/2017 1031   K 4.8 02/19/2017 1031   CL 103 02/19/2017 1031   CO2 25 02/19/2017 1031   GLUCOSE 151 (H) 02/19/2017 1031   BUN 8 02/19/2017 1031   CREATININE 0.83 02/19/2017 1031   CREATININE 0.84 05/20/2012 1010   CALCIUM 8.9 02/19/2017 1031   GFRNONAA >60 02/19/2017 1031   GFRAA >60 02/19/2017 1031   No results found for: HGBA1C No results found for: INSULIN CBC    Component Value Date/Time   WBC 8.7 03/26/2018 1601   WBC 9.4 07/27/2013 1718   RBC 4.36 03/26/2018 1601   RBC 4.71 07/27/2013 1718   HGB 11.2 03/26/2018 1601   HGB 13.6 07/27/2013 0945   HCT 34.9 03/26/2018 1601   PLT 357 03/26/2018 1601   MCV 80 03/26/2018 1601   MCH 25.7 (L) 03/26/2018 1601   MCH 28.9 07/27/2013 1718   MCHC 32.1 03/26/2018 1601   MCHC 34.7 07/27/2013 1718   RDW 14.9 03/26/2018 1601   Iron/TIBC/Ferritin/ %Sat    Component Value Date/Time   FERRITIN 20 03/26/2018 1601   Lipid Panel  No results found for: CHOL, TRIG, HDL, CHOLHDL, VLDL, LDLCALC, LDLDIRECT Hepatic Function Panel     Component Value Date/Time   PROT 7.7 02/19/2017 1031   ALBUMIN 3.8 02/19/2017 1031   AST 125 (H) 02/19/2017 1031   ALT 84 (  H) 02/19/2017 1031   ALKPHOS 95  02/19/2017 1031   BILITOT 0.7 02/19/2017 1031      Component Value Date/Time   TSH 2.570 03/06/2017 0858   Vitamin D: None reported  ECG shows NSR with a rate of 73 BPM.  INDIRECT CALORIMETER done today shows a VO2 of 275 and a REE of 1916. Her calculated basal metabolic rate is 5035 thus her basal metabolic rate is worse than expected.  OBESITY BEHAVIORAL INTERVENTION VISIT  Today's visit was #1   Starting weight: 321 lbs Starting date: 08/26/2018 Today's weight: 321 lbs  Today's date: 08/26/2018 Total lbs lost to date: 0    08/26/2018  Height 5' 5"  (1.651 m)  Weight 321 lb (145.6 kg) (A)  BMI (Calculated) 53.42  BLOOD PRESSURE - SYSTOLIC 465  BLOOD PRESSURE - DIASTOLIC 82  Waist Measurement  52 inches   Body Fat % 51.4 %  Total Body Water (lbs) 108.8 lbs  RMR 1916   ASK: We discussed the diagnosis of obesity with [name] today and Diane Padilla agreed to give [place] permission to discuss obesity behavioral modification therapy today.  ASSESS: Diane Padilla has the diagnosis of obesity and her BMI today is 53.42. Diane Padilla is in the action stage of change.   ADVISE: [place] was educated on the multiple health risks of obesity as well as the benefit of weight loss to improve her health. She was advised of the need for long term treatment and the importance of lifestyle modifications to improve her current health and to decrease her risk of future health problems.  AGREE: Multiple dietary modification options and treatment options were discussed and  Diane Padilla agreed to follow the recommendations documented in the above note.  ARRANGE: Diane Padilla was educated on the importance of frequent visits to treat obesity as outlined per CMS and USPSTF guidelines and agreed to schedule her next follow up appointment today.   I, Michaelene Song, am acting as Location manager for Dennard Nip, MD  I have reviewed the above documentation for accuracy and completeness, and I agree with the  above. -Dennard Nip, MD

## 2018-08-27 LAB — COMPREHENSIVE METABOLIC PANEL
ALT: 22 IU/L (ref 0–32)
AST: 18 IU/L (ref 0–40)
Albumin/Globulin Ratio: 1.3 (ref 1.2–2.2)
Albumin: 4.3 g/dL (ref 3.8–4.8)
Alkaline Phosphatase: 84 IU/L (ref 39–117)
BUN/Creatinine Ratio: 14 (ref 9–23)
BUN: 11 mg/dL (ref 6–20)
Bilirubin Total: 0.2 mg/dL (ref 0.0–1.2)
CO2: 22 mmol/L (ref 20–29)
Calcium: 9.2 mg/dL (ref 8.7–10.2)
Chloride: 101 mmol/L (ref 96–106)
Creatinine, Ser: 0.79 mg/dL (ref 0.57–1.00)
GFR calc Af Amer: 113 mL/min/{1.73_m2} (ref >59)
GFR calc non Af Amer: 98 mL/min/{1.73_m2} (ref >59)
GLUCOSE: 124 mg/dL — AB (ref 65–99)
Globulin, Total: 3.2 g/dL (ref 1.5–4.5)
Potassium: 4.7 mmol/L (ref 3.5–5.2)
Sodium: 135 mmol/L (ref 134–144)
Total Protein: 7.5 g/dL (ref 6.0–8.5)

## 2018-08-27 LAB — CBC WITH DIFFERENTIAL
Basophils Absolute: 0 10*3/uL (ref 0.0–0.2)
Basos: 1 %
EOS (ABSOLUTE): 0.2 10*3/uL (ref 0.0–0.4)
EOS: 2 %
Hematocrit: 38.7 % (ref 34.0–46.6)
Hemoglobin: 12.5 g/dL (ref 11.1–15.9)
Immature Grans (Abs): 0 10*3/uL (ref 0.0–0.1)
Immature Granulocytes: 0 %
Lymphocytes Absolute: 3.1 10*3/uL (ref 0.7–3.1)
Lymphs: 45 %
MCH: 27.1 pg (ref 26.6–33.0)
MCHC: 32.3 g/dL (ref 31.5–35.7)
MCV: 84 fL (ref 79–97)
Monocytes Absolute: 0.4 10*3/uL (ref 0.1–0.9)
Monocytes: 6 %
Neutrophils Absolute: 3.2 10*3/uL (ref 1.4–7.0)
Neutrophils: 46 %
RBC: 4.61 x10E6/uL (ref 3.77–5.28)
RDW: 15.6 % — ABNORMAL HIGH (ref 11.7–15.4)
WBC: 6.9 10*3/uL (ref 3.4–10.8)

## 2018-08-27 LAB — HEMOGLOBIN A1C
Est. average glucose Bld gHb Est-mCnc: 137 mg/dL
Hgb A1c MFr Bld: 6.4 % — ABNORMAL HIGH (ref 4.8–5.6)

## 2018-08-27 LAB — LIPID PANEL WITH LDL/HDL RATIO
Cholesterol, Total: 154 mg/dL (ref 100–199)
HDL: 36 mg/dL — ABNORMAL LOW (ref >39)
LDL Calculated: 102 mg/dL — ABNORMAL HIGH (ref 0–99)
LDl/HDL Ratio: 2.8 ratio (ref 0.0–3.2)
Triglycerides: 79 mg/dL (ref 0–149)
VLDL CHOLESTEROL CAL: 16 mg/dL (ref 5–40)

## 2018-08-27 LAB — FOLATE: FOLATE: 17.5 ng/mL (ref >3.0)

## 2018-08-27 LAB — T3: T3, Total: 120 ng/dL (ref 71–180)

## 2018-08-27 LAB — MICROALBUMIN / CREATININE URINE RATIO
Creatinine, Urine: 145.7 mg/dL
Microalb/Creat Ratio: 9 mg/g creat (ref 0–29)
Microalbumin, Urine: 12.6 ug/mL

## 2018-08-27 LAB — T4, FREE: FREE T4: 1.18 ng/dL (ref 0.82–1.77)

## 2018-08-27 LAB — VITAMIN B12: Vitamin B-12: 771 pg/mL (ref 232–1245)

## 2018-08-27 LAB — VITAMIN D 25 HYDROXY (VIT D DEFICIENCY, FRACTURES): Vit D, 25-Hydroxy: 54 ng/mL (ref 30.0–100.0)

## 2018-08-27 LAB — INSULIN, RANDOM: INSULIN: 49.9 u[IU]/mL — AB (ref 2.6–24.9)

## 2018-08-27 LAB — TSH: TSH: 1.66 u[IU]/mL (ref 0.450–4.500)

## 2018-09-01 DIAGNOSIS — G4733 Obstructive sleep apnea (adult) (pediatric): Secondary | ICD-10-CM | POA: Diagnosis not present
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
## 2018-09-08 ENCOUNTER — Other Ambulatory Visit: Payer: Self-pay

## 2018-09-08 ENCOUNTER — Ambulatory Visit (INDEPENDENT_AMBULATORY_CARE_PROVIDER_SITE_OTHER): Payer: BLUE CROSS/BLUE SHIELD | Admitting: Psychology

## 2018-09-08 DIAGNOSIS — F3289 Other specified depressive episodes: Secondary | ICD-10-CM

## 2018-09-09 ENCOUNTER — Ambulatory Visit (INDEPENDENT_AMBULATORY_CARE_PROVIDER_SITE_OTHER): Payer: BLUE CROSS/BLUE SHIELD | Admitting: Family Medicine

## 2018-09-09 ENCOUNTER — Encounter (INDEPENDENT_AMBULATORY_CARE_PROVIDER_SITE_OTHER): Payer: Self-pay | Admitting: Family Medicine

## 2018-09-09 ENCOUNTER — Other Ambulatory Visit: Payer: Self-pay

## 2018-09-09 VITALS — BP 139/86 | HR 89 | Ht 65.0 in | Wt 318.0 lb

## 2018-09-09 DIAGNOSIS — Z9189 Other specified personal risk factors, not elsewhere classified: Secondary | ICD-10-CM

## 2018-09-09 DIAGNOSIS — E119 Type 2 diabetes mellitus without complications: Secondary | ICD-10-CM

## 2018-09-09 DIAGNOSIS — Z6841 Body Mass Index (BMI) 40.0 and over, adult: Secondary | ICD-10-CM

## 2018-09-09 DIAGNOSIS — I10 Essential (primary) hypertension: Secondary | ICD-10-CM

## 2018-09-09 NOTE — Progress Notes (Signed)
Office: [phone]  /  Fax: [phone]   HPI:   Chief Complaint: OBESITY Diane Padilla is here to discuss her progress with her obesity treatment plan. She is on the Category 3 plan and is following her eating plan approximately 50 % of the time. She states she is exercising 0 minutes 0 times per week. Diane Padilla states that she followed her plan about half the time, but didn't get a chance to grocery shop.   She is checking her blood glucose 3 times per day with her sugars ranging from 89 to 138.  Her weight is (!) 318 lb (144.2 kg) today and has had a weight loss of 3 pounds over a period of 2 weeks since her last visit. She has lost 3 lbs since starting treatment with [place].  Diabetes II Diane Padilla has a new diagnosis of diabetes type II. Diane Padilla is on metformin for polycystic ovarian syndrome, but has an A1c of 6.4  and a very high fasting Insulin level of 49.9 on 08/26/18. She has been working on intensive lifestyle modifications including diet, exercise, and weight loss to help control her blood glucose levels.  Hypertension Diane Padilla is a 33 y.o. female with hypertension. Diane Padilla has had a history of multiple elevated blood pressure readings. She is trying to work on diet and weight loss to help control her blood pressure with the goal of decreasing her risk of heart attack and stroke. Diane Padilla denies chest pain or headache.  At risk for cardiovascular disease Diane Padilla is at a higher than average risk for cardiovascular disease due to hypertension and obesity. She currently denies any chest pain.  ASSESSMENT AND PLAN:  Type 2 diabetes mellitus without complication, without long-term current use of insulin (HCC)  Essential hypertension  At risk for heart disease  Class 3 severe obesity with serious comorbidity and body mass index (BMI) of 50.0 to 59.9 in adult, unspecified obesity type ([place])  PLAN:  Diabetes II Diane Padilla has been given extensive diabetes education by  myself today including ideal fasting and post-prandial blood glucose readings, individual ideal Hgb A1c goals, and hypoglycemia prevention. We discussed the importance of good blood sugar control to decrease the likelihood of diabetic complications such as nephropathy, neuropathy, limb loss, blindness, coronary artery disease, and death. We discussed the importance of intensive lifestyle modification including diet, exercise and weight loss as the first line treatment for diabetes. We will recheck labs in 3 months. Diane Padilla agrees to continue her metformin and her diet and she will follow up at the agreed upon time in 2 to 3 weeks.  Hypertension We discussed sodium restriction, working on healthy weight loss, and a regular exercise program as the means to achieve improved blood pressure control. We will continue to monitor her blood pressure as well as her progress with the above lifestyle modifications. She will continue her diet and exercise and will watch for signs of hypotension as she continues her lifestyle modifications. We will recheck her blood pressure in 2 to 3 weeks. Diane Padilla agreed with this plan and agreed to follow up as directed.  Cardiovascular risk counseling Diane Padilla was given extended (30 minutes) coronary artery disease prevention counseling today. She is 32 y.o. female and has risk factors for heart disease including hypertension and obesity. We discussed intensive lifestyle modifications today with an emphasis on specific weight loss instructions and strategies. Pt was also informed of the importance of increasing exercise and decreasing saturated fats to help prevent heart disease.  Obesity Diane Padilla is currently  in the action stage of change. As such, her goal is to continue with weight loss efforts. She has agreed to follow the Category 3 plan.  Diane Padilla has been instructed to work up to a goal of 150 minutes of combined cardio and strengthening exercise per week for weight loss  and overall health benefits. We discussed the following Behavioral Modification Strategies today: increasing lean protein intake, decreasing simple carbohydrates, and work on meal planning and easy cooking plans.  Diane Padilla has agreed to follow up with our clinic in 2 to 3 weeks. She was informed of the importance of frequent follow up visits to maximize her success with intensive lifestyle modifications for her multiple health conditions.  I spent > than 50% of the 25 minute visit on counseling as documented in the note.   ALLERGIES: No Known Allergies  MEDICATIONS: Current Outpatient Medications on File Prior to Visit  Medication Sig Dispense Refill  . Cholecalciferol (VITAMIN D) 50 MCG (2000 UT) CAPS Take 1 capsule by mouth daily.    . diphenhydrAMINE (BENADRYL ALLERGY) 25 MG tablet Take 25 mg by mouth at bedtime as needed.    . diphenhydramine-acetaminophen (TYLENOL PM) 25-500 MG TABS tablet Take 1 tablet by mouth at bedtime as needed.    . meloxicam (MOBIC) 15 MG tablet Take 15 mg by mouth daily.    . metFORMIN (GLUCOPHAGE-XR) 500 MG 24 hr tablet Take by mouth. Take one tablet by mouth with breakfast and 2 tablets with evening meal    . Multiple Vitamin (MULTIVITAMIN) tablet Take 1 tablet by mouth daily.    . vitamin B-12 (CYANOCOBALAMIN) 1000 MCG tablet Take 1,000 mcg by mouth daily.    . vitamin C (ASCORBIC ACID) 500 MG tablet Take 500 mg by mouth daily.     No current facility-administered medications on file prior to visit.     PAST MEDICAL HISTORY: Past Medical History:  Diagnosis Date  . Allergy   . Back pain   . Depression   . Diabetes mellitus without complication ([place]) 6/80/32   no meds at this time  . Dyspnea   . GERD (gastroesophageal reflux disease)   . PCOS (polycystic ovarian syndrome)   . Sleep apnea   . Vitamin D deficiency     PAST SURGICAL HISTORY: Past Surgical History:  Procedure Laterality Date  . WISDOM TOOTH EXTRACTION      SOCIAL HISTORY:  Social History   Tobacco Use  . Smoking status: Never Smoker  . Smokeless tobacco: Never Used  Substance Use Topics  . Alcohol use: No  . Drug use: No    FAMILY HISTORY: Family History  Problem Relation Age of Onset  . Diabetes Mother   . Hypertension Mother   . Hyperlipidemia Mother   . Stroke Mother   . Cancer Mother   . Kidney disease Mother   . Sleep apnea Mother   . Drug abuse Mother   . Obesity Mother   . Hypertension Maternal Grandmother   . Hyperlipidemia Maternal Grandmother   . Drug abuse Father   . Breast cancer Cousin 30       chemo is done and upcoming surgery - ?BRCA Negative   ROS: Review of Systems  Constitutional: Positive for weight loss.  Cardiovascular: Negative for chest pain.  Neurological: Negative for headaches.   PHYSICAL EXAM: Blood pressure 139/86, pulse 89, height 5' 5"  (1.651 m), weight (!) 318 lb (144.2 kg), last menstrual period 07/22/2018, SpO2 97 %. Body mass index is 52.92 kg/m. Physical  Exam Vitals signs reviewed.  Constitutional:      Appearance: Normal appearance. She is obese.  Cardiovascular:     Rate and Rhythm: Normal rate.  Pulmonary:     Effort: Pulmonary effort is normal.  Musculoskeletal: Normal range of motion.  Skin:    General: Skin is warm and dry.  Neurological:     Mental Status: She is alert and oriented to person, place, and time.  Psychiatric:        Mood and Affect: Mood normal.        Behavior: Behavior normal.    RECENT LABS AND TESTS: BMET    Component Value Date/Time   NA 135 08/26/2018 0931   K 4.7 08/26/2018 0931   CL 101 08/26/2018 0931   CO2 22 08/26/2018 0931   GLUCOSE 124 (H) 08/26/2018 0931   GLUCOSE 151 (H) 02/19/2017 1031   BUN 11 08/26/2018 0931   CREATININE 0.79 08/26/2018 0931   CREATININE 0.84 05/20/2012 1010   CALCIUM 9.2 08/26/2018 0931   GFRNONAA 98 08/26/2018 0931   GFRAA 113 08/26/2018 0931   Lab Results  Component Value Date   HGBA1C 6.4 (H) 08/26/2018   Lab  Results  Component Value Date   INSULIN 49.9 (H) 08/26/2018   CBC    Component Value Date/Time   WBC 6.9 08/26/2018 0931   WBC 9.4 07/27/2013 1718   RBC 4.61 08/26/2018 0931   RBC 4.71 07/27/2013 1718   HGB 12.5 08/26/2018 0931   HGB 13.6 07/27/2013 0945   HCT 38.7 08/26/2018 0931   PLT 357 03/26/2018 1601   MCV 84 08/26/2018 0931   MCH 27.1 08/26/2018 0931   MCH 28.9 07/27/2013 1718   MCHC 32.3 08/26/2018 0931   MCHC 34.7 07/27/2013 1718   RDW 15.6 (H) 08/26/2018 0931   LYMPHSABS 3.1 08/26/2018 0931   EOSABS 0.2 08/26/2018 0931   BASOSABS 0.0 08/26/2018 0931   Iron/TIBC/Ferritin/ %Sat    Component Value Date/Time   FERRITIN 20 03/26/2018 1601   Lipid Panel     Component Value Date/Time   CHOL 154 08/26/2018 0931   TRIG 79 08/26/2018 0931   HDL 36 (L) 08/26/2018 0931   LDLCALC 102 (H) 08/26/2018 0931   Hepatic Function Panel     Component Value Date/Time   PROT 7.5 08/26/2018 0931   ALBUMIN 4.3 08/26/2018 0931   AST 18 08/26/2018 0931   ALT 22 08/26/2018 0931   ALKPHOS 84 08/26/2018 0931   BILITOT 0.2 08/26/2018 0931      Component Value Date/Time   TSH 1.660 08/26/2018 0931   TSH 2.570 03/06/2017 0858   Results for LAGENA, STRAND (MRN [id number]) as of 09/09/2018 16:23  Ref. Range 08/26/2018 09:31  Vitamin D, 25-Hydroxy Latest Ref Range: 30.0 - 100.0 ng/mL 54.0   OBESITY BEHAVIORAL INTERVENTION VISIT  Today's visit was # 2  Starting weight: 321 lbs Starting date: 08/26/18 Today's weight : Weight: (!) 318 lb (144.2 kg)  Today's date: 09/09/2018 Total lbs lost to date: 3    09/09/2018  Height 5' 5"  (1.651 m)  Weight 318 lb (144.2 kg) (A)  BMI (Calculated) 52.92  BLOOD PRESSURE - SYSTOLIC 676  BLOOD PRESSURE - DIASTOLIC 86   Body Fat % 72.0 %  Total Body Water (lbs) 105.8 lbs   ASK: We discussed the diagnosis of obesity with [name] today and Diane Padilla agreed to give [place] permission to discuss obesity behavioral modification therapy today.   ASSESS: Diane Padilla has the diagnosis of  obesity and her BMI today is 52.92. Diane Padilla is in the action stage of change.   ADVISE: [place] was educated on the multiple health risks of obesity as well as the benefit of weight loss to improve her health. She was advised of the need for long term treatment and the importance of lifestyle modifications to improve her current health and to decrease her risk of future health problems.  AGREE: Multiple dietary modification options and treatment options were discussed and Diane Padilla agreed to follow the recommendations documented in the above note.  ARRANGE: Diane Padilla was educated on the importance of frequent visits to treat obesity as outlined per CMS and USPSTF guidelines and agreed to schedule her next follow up appointment today.  IMarcille Blanco, CMA, am acting as transcriptionist for Starlyn Skeans, MD  I have reviewed the above documentation for accuracy and completeness, and I agree with the above. -Dennard Nip, MD

## 2018-09-22 ENCOUNTER — Ambulatory Visit (INDEPENDENT_AMBULATORY_CARE_PROVIDER_SITE_OTHER): Payer: Self-pay | Admitting: Psychology

## 2018-09-24 ENCOUNTER — Ambulatory Visit (INDEPENDENT_AMBULATORY_CARE_PROVIDER_SITE_OTHER): Payer: Self-pay | Admitting: Family Medicine

## 2018-09-25 ENCOUNTER — Ambulatory Visit (INDEPENDENT_AMBULATORY_CARE_PROVIDER_SITE_OTHER): Payer: Self-pay | Admitting: Psychology

## 2018-09-25 ENCOUNTER — Telehealth: Payer: Self-pay | Admitting: Obstetrics and Gynecology

## 2018-09-25 ENCOUNTER — Ambulatory Visit (INDEPENDENT_AMBULATORY_CARE_PROVIDER_SITE_OTHER): Payer: Self-pay | Admitting: Family Medicine

## 2018-09-25 NOTE — Telephone Encounter (Signed)
Patient called requesting an appointment for vaginal irritation with Dr. Oscar La tomorrow. However, she declined available openings for 09/26/18 stating she needed to be seen later in the day. Patient asked to speak with the nurse for advice.

## 2018-09-25 NOTE — Telephone Encounter (Signed)
Spoke with patient. Reports internal vaginal irritation that started last weekend. Reports yellow to white vaginal d/c with odor. Denies vaginal bleeding, pain, fever/chills, urinary symptoms. Denies STD concerns, did use [place], which is a new product for her. Advised further evaluation needed with provider, patient request WebEx OV. Patient declines OV offered for 4/3. WebEx OV scheduled for 4/6 at 12:30pm with Dr. Edward Jolly. Advised Dr. Edward Jolly will review, our office will return call if any additional recommendations.   Last AEX 03/26/18 JJ  Routing to provider for final review. Patient is agreeable to disposition. Will close encounter.

## 2018-09-26 ENCOUNTER — Telehealth: Payer: Self-pay | Admitting: Obstetrics and Gynecology

## 2018-09-26 NOTE — Telephone Encounter (Signed)
erro  neous encounter

## 2018-09-29 ENCOUNTER — Encounter: Payer: Self-pay | Admitting: Obstetrics and Gynecology

## 2018-09-29 ENCOUNTER — Ambulatory Visit (INDEPENDENT_AMBULATORY_CARE_PROVIDER_SITE_OTHER): Payer: BLUE CROSS/BLUE SHIELD | Admitting: Obstetrics and Gynecology

## 2018-09-29 DIAGNOSIS — Z319 Encounter for procreative management, unspecified: Secondary | ICD-10-CM | POA: Diagnosis not present

## 2018-09-29 DIAGNOSIS — N760 Acute vaginitis: Secondary | ICD-10-CM | POA: Diagnosis not present

## 2018-09-29 MED ORDER — FLUCONAZOLE 150 MG PO TABS: 150.0000 mg | ORAL_TABLET | Freq: Once | ORAL | 0 refills | Status: AC

## 2018-09-29 NOTE — Progress Notes (Signed)
GYNECOLOGY  VISIT   HPI: 33 y.o.    Married  [place] American  female   [place] with Patient's last menstrual period was 07/22/2018.   here for vaginitis by Telephone visit.  Para March facilitated the phone visit.  Patient consents to the phone visit.  Patient is at her office and I am at my office.  Started at 12:40 and ended at 12:48.   Additional question regarding LMP, so Restarted call at 12:55 and ended at 12:58.   Having vaginal irritation and discharge for a little over a week.  Not experiencing external irritation.  Not sure if she was exposed to something new.   Has tried [employer] recently.  Otherwise no new products.  Some odor.  No new partner.  Female partner.  Just got married in February.   Not used anything OTC.   She states she usually has yeast vaginitis.  She has used Terazol cream and Diflucan in the past.   Has elevated hemoglobin A1C 6.4 on 08/26/18.   Negative vaginitis testing on 08/19/18.   Did a UPT at home yesterday, and she states she is not pregnant.  LMP 07/22/18 - 07/29/18. States her menses are usually irregular.  She and her husband are considering pregnancy.   GYNECOLOGIC HISTORY: Patient's last menstrual period was 07/22/2018. Contraception:  none Menopausal hormone therapy:  n/a Last mammogram:  03/15/2017 bilateral diagnostic mammogram with right breast ultrasound birads 1 negative Last pap smear:   03/27/18 Neg:Neg HR HPV        OB History    Gravida  0   Para  0   Term  0   Preterm  0   AB  0   Living  0     SAB  0   TAB  0   Ectopic  0   Multiple  0   Live Births                 Patient Active Problem List   Diagnosis Date Noted  . PCOS (polycystic ovarian syndrome) 01/09/2013  . Oligomenorrhea 12/12/2012    Past Medical History:  Diagnosis Date  . Allergy   . Back pain   . Depression   . Diabetes mellitus without complication ([place]) 2/56/38   no meds at this time  . Dyspnea   . GERD (gastroesophageal  reflux disease)   . PCOS (polycystic ovarian syndrome)   . Sleep apnea   . Vitamin D deficiency     Past Surgical History:  Procedure Laterality Date  . WISDOM TOOTH EXTRACTION      Current Outpatient Medications  Medication Sig Dispense Refill  . Cholecalciferol (VITAMIN D) 50 MCG (2000 UT) CAPS Take 1 capsule by mouth daily.    . diphenhydrAMINE (BENADRYL ALLERGY) 25 MG tablet Take 25 mg by mouth at bedtime as needed.    . diphenhydramine-acetaminophen (TYLENOL PM) 25-500 MG TABS tablet Take 1 tablet by mouth at bedtime as needed.    . fluconazole (DIFLUCAN) 150 MG tablet Take 1 tablet (150 mg total) by mouth once for 1 dose. Take one tablet.  Repeat in 72 hours if symptoms are not completely resolved. 2 tablet 0  . meloxicam (MOBIC) 15 MG tablet Take 15 mg by mouth daily.    . metFORMIN (GLUCOPHAGE-XR) 500 MG 24 hr tablet Take by mouth. Take one tablet by mouth with breakfast and 2 tablets with evening meal    . Multiple Vitamin (MULTIVITAMIN) tablet Take 1 tablet by mouth  daily.    . vitamin B-12 (CYANOCOBALAMIN) 1000 MCG tablet Take 1,000 mcg by mouth daily.    . vitamin C (ASCORBIC ACID) 500 MG tablet Take 500 mg by mouth daily.     No current facility-administered medications for this visit.      ALLERGIES: Patient has no known allergies.  Family History  Problem Relation Age of Onset  . Diabetes Mother   . Hypertension Mother   . Hyperlipidemia Mother   . Stroke Mother   . Cancer Mother   . Kidney disease Mother   . Sleep apnea Mother   . Drug abuse Mother   . Obesity Mother   . Hypertension Maternal Grandmother   . Hyperlipidemia Maternal Grandmother   . Drug abuse Father   . Breast cancer Cousin 30       chemo is done and upcoming surgery - ?BRCA Negative    Social History   Socioeconomic History  . Marital status: Single    Spouse name: Verl Blalock.   . Number of children: Not on file  . Years of education: Not on file  . Highest education level: Not  on file  Occupational History  . Occupation: Training and development officer: [employer]   Social Needs  . Financial resource strain: Not on file  . Food insecurity:    Worry: Not on file    Inability: Not on file  . Transportation needs:    Medical: Not on file    Non-medical: Not on file  Tobacco Use  . Smoking status: Never Smoker  . Smokeless tobacco: Never Used  Substance and Sexual Activity  . Alcohol use: No  . Drug use: No  . Sexual activity: Not Currently    Partners: Male    Birth control/protection: None  Lifestyle  . Physical activity:    Days per week: Not on file    Minutes per session: Not on file  . Stress: Not on file  Relationships  . Social connections:    Talks on phone: Not on file    Gets together: Not on file    Attends religious service: Not on file    Active member of club or organization: Not on file    Attends meetings of clubs or organizations: Not on file    Relationship status: Not on file  . Intimate partner violence:    Fear of current or ex partner: Not on file    Emotionally abused: Not on file    Physically abused: Not on file    Forced sexual activity: Not on file  Other Topics Concern  . Not on file  Social History Narrative  . Not on file    Review of Systems  PHYSICAL EXAMINATION:    LMP 07/22/2018      NA.   ASSESSMENT  Vaginitis.  Likely yeast.  DM. Well controlled. UPT negative.  Desire for pregnancy.   PLAN  We discussed vaginitis types.  Diflucan 150 mg.  2 pills given.  Stay out of wet gym clothes, have good control of DM, and try probiotics.  Start PNV.  We briefly discussed Covid 19 and uncertain effect on pregnancy and fetus at this time.  Return if vaginitis symptoms persist.   An After Visit Summary was printed and given to the patient.  __11___ minute phone consultation.

## 2018-10-02 DIAGNOSIS — G4733 Obstructive sleep apnea (adult) (pediatric): Secondary | ICD-10-CM | POA: Diagnosis not present

## 2018-11-01 DIAGNOSIS — G4733 Obstructive sleep apnea (adult) (pediatric): Secondary | ICD-10-CM | POA: Diagnosis not present

## 2018-12-01 ENCOUNTER — Ambulatory Visit (INDEPENDENT_AMBULATORY_CARE_PROVIDER_SITE_OTHER): Payer: BC Managed Care – PPO | Admitting: Family Medicine

## 2018-12-01 ENCOUNTER — Other Ambulatory Visit: Payer: Self-pay

## 2018-12-01 ENCOUNTER — Encounter (INDEPENDENT_AMBULATORY_CARE_PROVIDER_SITE_OTHER): Payer: Self-pay | Admitting: Family Medicine

## 2018-12-01 DIAGNOSIS — Z6841 Body Mass Index (BMI) 40.0 and over, adult: Secondary | ICD-10-CM

## 2018-12-01 DIAGNOSIS — E1165 Type 2 diabetes mellitus with hyperglycemia: Secondary | ICD-10-CM | POA: Diagnosis not present

## 2018-12-02 DIAGNOSIS — G4733 Obstructive sleep apnea (adult) (pediatric): Secondary | ICD-10-CM | POA: Diagnosis not present

## 2018-12-02 NOTE — Progress Notes (Signed)
Office: [phone]  /  Fax: [phone] TeleHealth Visit:  [name] has verbally consented to this TeleHealth visit today. The patient is located at home, the provider is located at the [facility] office. The participants in this visit include the listed provider and patient. Diane Padilla was unable to use realtime audiovisual technology today and the telehealth visit was conducted via telephone.   HPI:   Chief Complaint: OBESITY Diane Padilla is here to discuss her progress with her obesity treatment plan. She is on the Category 3 plan and is following her eating plan approximately 0 % of the time (unsure). She states she is exercising 0 minutes 0 times per week. Diane Padilla's last visit was 3 months ago. She has been trying to stay on track, but she is struggling especially for dinner due to less time to prep and plan. She has been doing more grab and go, but she is ready to get back on track.  We were unable to weigh the patient today for this TeleHealth visit. She feels as if she has gained weight since her last visit. She has lost 3 lbs since starting treatment with [place].  Diabetes II Diane Padilla has a diagnosis of diabetes type II. Diane Padilla states she has not been checking her BGs at home. Last A1c was 6.4. She denies nausea, vomiting, or hypoglycemia on metformin. She has struggled more in the last 2 to 3 months to stay on track with her eating. She has been working on intensive lifestyle modifications including diet, exercise, and weight loss to help control her blood glucose levels.  ASSESSMENT AND PLAN:  Type 2 diabetes mellitus without complication, without long-term current use of insulin (HCC)  Class 3 severe obesity with serious comorbidity and body mass index (BMI) of 50.0 to 59.9 in adult, unspecified obesity type ([place])  PLAN:  Diabetes II Diane Padilla has been given extensive diabetes education by myself today including ideal fasting and post-prandial blood glucose  readings, individual ideal Hgb A1c goals and hypoglycemia prevention. We discussed the importance of good blood sugar control to decrease the likelihood of diabetic complications such as nephropathy, neuropathy, limb loss, blindness, coronary artery disease, and death. We discussed the importance of intensive lifestyle modification including diet, exercise and weight loss as the first line treatment for diabetes. Diane Padilla agrees to continue taking metformin and get back to her diet prescription. We will continue to monitor. Diane Padilla agrees to follow up with our clinic in 2 to 3 weeks.  I spent > than 50% of the 25 minute visit on counseling as documented in the note.  Obesity Diane Padilla is currently in the action stage of change. As such, her goal is to continue with weight loss efforts She has agreed to keep a food journal with 400-650 calories and 40+ grams of protein at supper daily and follow the Category 3 [place] has been instructed to work up to a goal of 150 minutes of combined cardio and strengthening exercise per week for weight loss and overall health benefits. We discussed the following Behavioral Modification Strategies today: increasing lean protein intake, decreasing simple carbohydrates, increasing vegetables and work on meal planning and easy cooking plans   Diane Padilla has agreed to follow up with our clinic in 2 to 3 weeks. She was informed of the importance of frequent follow up visits to maximize her success with intensive lifestyle modifications for her multiple health conditions.  ALLERGIES: No Known Allergies  MEDICATIONS: Current Outpatient Medications on File Prior to Visit  Medication Sig Dispense Refill   Cholecalciferol (VITAMIN D) 50 MCG (2000 UT) CAPS Take 1 capsule by mouth daily.     diphenhydrAMINE (BENADRYL ALLERGY) 25 MG tablet Take 25 mg by mouth at bedtime as needed.     diphenhydramine-acetaminophen (TYLENOL PM) 25-500 MG TABS tablet Take 1 tablet by  mouth at bedtime as needed.     meloxicam (MOBIC) 15 MG tablet Take 15 mg by mouth daily.     metFORMIN (GLUCOPHAGE-XR) 500 MG 24 hr tablet Take by mouth. Take one tablet by mouth with breakfast and 2 tablets with evening meal     Multiple Vitamin (MULTIVITAMIN) tablet Take 1 tablet by mouth daily.     vitamin B-12 (CYANOCOBALAMIN) 1000 MCG tablet Take 1,000 mcg by mouth daily.     vitamin C (ASCORBIC ACID) 500 MG tablet Take 500 mg by mouth daily.     No current facility-administered medications on file prior to visit.     PAST MEDICAL HISTORY: Past Medical History:  Diagnosis Date   Allergy    Back pain    Depression    Diabetes mellitus without complication ([place]) 3/61/44   no meds at this time   Dyspnea    GERD (gastroesophageal reflux disease)    PCOS (polycystic ovarian syndrome)    Sleep apnea    Vitamin D deficiency     PAST SURGICAL HISTORY: Past Surgical History:  Procedure Laterality Date   WISDOM TOOTH EXTRACTION      SOCIAL HISTORY: Social History   Tobacco Use   Smoking status: Never Smoker   Smokeless tobacco: Never Used  Substance Use Topics   Alcohol use: No   Drug use: No    FAMILY HISTORY: Family History  Problem Relation Age of Onset   Diabetes Mother    Hypertension Mother    Hyperlipidemia Mother    Stroke Mother    Cancer Mother    Kidney disease Mother    Sleep apnea Mother    Drug abuse Mother    Obesity Mother    Hypertension Maternal Grandmother    Hyperlipidemia Maternal Grandmother    Drug abuse Father    Breast cancer Cousin 70       chemo is done and upcoming surgery - ?BRCA Negative    ROS: Review of Systems  Constitutional: Negative for weight loss.  Gastrointestinal: Negative for nausea and vomiting.  Endo/Heme/Allergies:       Negative hypoglycemia    PHYSICAL EXAM: Pt in no acute distress  RECENT LABS AND TESTS: BMET    Component Value Date/Time   NA 135 08/26/2018 0931   K  4.7 08/26/2018 0931   CL 101 08/26/2018 0931   CO2 22 08/26/2018 0931   GLUCOSE 124 (H) 08/26/2018 0931   GLUCOSE 151 (H) 02/19/2017 1031   BUN 11 08/26/2018 0931   CREATININE 0.79 08/26/2018 0931   CREATININE 0.84 05/20/2012 1010   CALCIUM 9.2 08/26/2018 0931   GFRNONAA 98 08/26/2018 0931   GFRAA 113 08/26/2018 0931   Lab Results  Component Value Date   HGBA1C 6.4 (H) 08/26/2018   Lab Results  Component Value Date   INSULIN 49.9 (H) 08/26/2018   CBC    Component Value Date/Time   WBC 6.9 08/26/2018 0931   WBC 9.4 07/27/2013 1718   RBC 4.61 08/26/2018 0931   RBC 4.71 07/27/2013 1718   HGB 12.5 08/26/2018 0931   HGB 13.6 07/27/2013 0945   HCT 38.7 08/26/2018 0931   PLT 357 03/26/2018  1601   MCV 84 08/26/2018 0931   MCH 27.1 08/26/2018 0931   MCH 28.9 07/27/2013 1718   MCHC 32.3 08/26/2018 0931   MCHC 34.7 07/27/2013 1718   RDW 15.6 (H) 08/26/2018 0931   LYMPHSABS 3.1 08/26/2018 0931   EOSABS 0.2 08/26/2018 0931   BASOSABS 0.0 08/26/2018 0931   Iron/TIBC/Ferritin/ %Sat    Component Value Date/Time   FERRITIN 20 03/26/2018 1601   Lipid Panel     Component Value Date/Time   CHOL 154 08/26/2018 0931   TRIG 79 08/26/2018 0931   HDL 36 (L) 08/26/2018 0931   LDLCALC 102 (H) 08/26/2018 0931   Hepatic Function Panel     Component Value Date/Time   PROT 7.5 08/26/2018 0931   ALBUMIN 4.3 08/26/2018 0931   AST 18 08/26/2018 0931   ALT 22 08/26/2018 0931   ALKPHOS 84 08/26/2018 0931   BILITOT 0.2 08/26/2018 0931      Component Value Date/Time   TSH 1.660 08/26/2018 0931   TSH 2.570 03/06/2017 0858      I, Trixie Dredge, am acting as transcriptionist for Dennard Nip, MD I have reviewed the above documentation for accuracy and completeness, and I agree with the above. -Dennard Nip, MD

## 2018-12-09 DIAGNOSIS — Z6841 Body Mass Index (BMI) 40.0 and over, adult: Secondary | ICD-10-CM | POA: Insufficient documentation

## 2018-12-09 DIAGNOSIS — E1165 Type 2 diabetes mellitus with hyperglycemia: Secondary | ICD-10-CM | POA: Insufficient documentation

## 2018-12-15 ENCOUNTER — Encounter (INDEPENDENT_AMBULATORY_CARE_PROVIDER_SITE_OTHER): Payer: Self-pay | Admitting: Family Medicine

## 2018-12-15 ENCOUNTER — Other Ambulatory Visit: Payer: Self-pay

## 2018-12-15 ENCOUNTER — Ambulatory Visit (INDEPENDENT_AMBULATORY_CARE_PROVIDER_SITE_OTHER): Payer: BC Managed Care – PPO | Admitting: Family Medicine

## 2018-12-15 VITALS — BP 114/76 | HR 80 | Temp 98.4°F | Ht 65.0 in | Wt 315.0 lb

## 2018-12-15 DIAGNOSIS — I10 Essential (primary) hypertension: Secondary | ICD-10-CM

## 2018-12-15 DIAGNOSIS — Z6841 Body Mass Index (BMI) 40.0 and over, adult: Secondary | ICD-10-CM

## 2018-12-15 DIAGNOSIS — E1165 Type 2 diabetes mellitus with hyperglycemia: Secondary | ICD-10-CM

## 2018-12-15 DIAGNOSIS — Z9189 Other specified personal risk factors, not elsewhere classified: Secondary | ICD-10-CM | POA: Diagnosis not present

## 2018-12-15 MED ORDER — METFORMIN HCL 500 MG PO TABS: 500.0000 mg | ORAL_TABLET | Freq: Three times a day (TID) | ORAL | 0 refills | Status: DC

## 2018-12-16 NOTE — Progress Notes (Signed)
Office: [phone]  /  Fax: [phone]   HPI:   Chief Complaint: OBESITY Diane Padilla is here to discuss her progress with her obesity treatment plan. She is on the keep a food journal with 400 to 650 calories and 40+ grams of protein at supper daily and the Category 3 plan and she is following her eating plan approximately 65 % of the time. She states she is walking 15 to 30 minutes 3 to 4 times per week. Diane Padilla continues to do well with weight loss, but she hasn't started journaling for dinner and she is mostly controlling her portions and making smarter food choices. Diane Padilla is bored with breakfast and she would like to look at other options. Her weight is (!) 315 lb (142.9 kg) today and has had a weight loss of 3 pounds since her last in-office visit. She has lost 6 lbs since starting treatment with [place].  Diabetes II Diane Padilla has a diagnosis of diabetes type II. Diane Padilla denies any hypoglycemic episodes, nausea or vomiting. Last A1c was at 6.4 and she is doing well on diet. She is on XR Metformin and her pharmacy called her and informed her of her record. Diane Padilla has been working on intensive lifestyle modifications including diet, exercise, and weight loss to help control her blood glucose levels.  Hypertension Diane Padilla is a 33 y.o. female with hypertension. She is not on medications. Wyvonne Lenz denies chest pain, headache or dizziness. Brittanys blood pressure is well controlled and is improving with weight loss. She is working weight loss to help control her blood pressure with the goal of decreasing her risk of heart attack and stroke.  At risk for cardiovascular disease Diane Padilla is at a higher than average risk for cardiovascular disease due to obesity and hypertension. She currently denies any chest pain.  ASSESSMENT AND PLAN:  Type 2 diabetes mellitus with hyperglycemia, without long-term current use of insulin ([place]) - Plan: metFORMIN (GLUCOPHAGE) 500 MG  tablet  Essential hypertension  At risk for heart disease  Class 3 severe obesity with serious comorbidity and body mass index (BMI) of 50.0 to 59.9 in adult, unspecified obesity type ([place])  PLAN:  Diabetes II Diane Padilla has been given extensive diabetes education by myself today including ideal fasting and post-prandial blood glucose readings, individual ideal Hgb A1c goals and hypoglycemia prevention. We discussed the importance of good blood sugar control to decrease the likelihood of diabetic complications such as nephropathy, neuropathy, limb loss, blindness, coronary artery disease, and death. We discussed the importance of intensive lifestyle modification including diet, exercise and weight loss as the first line treatment for diabetes. Diane Padilla agrees to change to metformin 500 mg 3 times daily with food #90 with no refills and follow up at the agreed upon time.  Hypertension We discussed sodium restriction, working on diet, healthy weight loss, and a regular exercise program as the means to achieve improved blood pressure control. Diane Padilla agreed with this plan and agreed to follow up as directed. We will continue to monitor her blood pressure as well as her progress with the above lifestyle modifications. She will watch for signs of hypotension as she continues her lifestyle modifications.  Cardiovascular risk counseling Diane Padilla was given extended (15 minutes) coronary artery disease prevention counseling today. She is 33 y.o. female and has risk factors for heart disease including obesity and hypertension. We discussed intensive lifestyle modifications today with an emphasis on specific weight loss instructions and strategies. Pt was also informed of the importance  of increasing exercise and decreasing saturated fats to help prevent heart disease.  Obesity Diane Padilla is currently in the action stage of change. As such, her goal is to continue with weight loss efforts She has agreed to  follow the Category 3 plan with breakfast options Diane Padilla has been instructed to work up to a goal of 150 minutes of combined cardio and strengthening exercise per week for weight loss and overall health benefits. We discussed the following Behavioral Modification Strategies today:  increasing lean protein intake, decreasing simple carbohydrates and work on meal planning and easy cooking plans  Diane Padilla has agreed to follow up with our clinic in 3 weeks. She was informed of the importance of frequent follow up visits to maximize her success with intensive lifestyle modifications for her multiple health conditions.  ALLERGIES: No Known Allergies  MEDICATIONS: Current Outpatient Medications on File Prior to Visit  Medication Sig Dispense Refill   Cholecalciferol (VITAMIN D) 50 MCG (2000 UT) CAPS Take 1 capsule by mouth daily.     diphenhydrAMINE (BENADRYL ALLERGY) 25 MG tablet Take 25 mg by mouth at bedtime as needed.     diphenhydramine-acetaminophen (TYLENOL PM) 25-500 MG TABS tablet Take 1 tablet by mouth at bedtime as needed.     Multiple Vitamin (MULTIVITAMIN) tablet Take 1 tablet by mouth daily.     vitamin B-12 (CYANOCOBALAMIN) 1000 MCG tablet Take 1,000 mcg by mouth daily.     vitamin C (ASCORBIC ACID) 500 MG tablet Take 500 mg by mouth daily.     No current facility-administered medications on file prior to visit.     PAST MEDICAL HISTORY: Past Medical History:  Diagnosis Date   Allergy    Back pain    Depression    Diabetes mellitus without complication ([place]) 11/24/07   no meds at this time   Dyspnea    GERD (gastroesophageal reflux disease)    PCOS (polycystic ovarian syndrome)    Sleep apnea    Vitamin D deficiency     PAST SURGICAL HISTORY: Past Surgical History:  Procedure Laterality Date   WISDOM TOOTH EXTRACTION      SOCIAL HISTORY: Social History   Tobacco Use   Smoking status: Never Smoker   Smokeless tobacco: Never Used  Substance  Use Topics   Alcohol use: No   Drug use: No    FAMILY HISTORY: Family History  Problem Relation Age of Onset   Diabetes Mother    Hypertension Mother    Hyperlipidemia Mother    Stroke Mother    Cancer Mother    Kidney disease Mother    Sleep apnea Mother    Drug abuse Mother    Obesity Mother    Hypertension Maternal Grandmother    Hyperlipidemia Maternal Grandmother    Drug abuse Father    Breast cancer Cousin 2       chemo is done and upcoming surgery - ?BRCA Negative    ROS: Review of Systems  Constitutional: Positive for weight loss.  Cardiovascular: Negative for chest pain.  Gastrointestinal: Negative for nausea and vomiting.  Neurological: Negative for dizziness and headaches.  Endo/Heme/Allergies:       Negative for hypoglycemia    PHYSICAL EXAM: Blood pressure 114/76, pulse 80, temperature 98.4 F (36.9 C), weight (!) 315 lb (142.9 kg), last menstrual period 08/16/2018, SpO2 98 %. Body mass index is 52.42 kg/m. Physical Exam Vitals signs reviewed.  Constitutional:      Appearance: Normal appearance. She is well-developed. She is obese.  Cardiovascular:     Rate and Rhythm: Normal rate.  Pulmonary:     Effort: Pulmonary effort is normal.  Musculoskeletal: Normal range of motion.  Skin:    General: Skin is warm and dry.  Neurological:     Mental Status: She is alert and oriented to person, place, and time.  Psychiatric:        Mood and Affect: Mood normal.        Behavior: Behavior normal.     RECENT LABS AND TESTS: BMET    Component Value Date/Time   NA 135 08/26/2018 0931   K 4.7 08/26/2018 0931   CL 101 08/26/2018 0931   CO2 22 08/26/2018 0931   GLUCOSE 124 (H) 08/26/2018 0931   GLUCOSE 151 (H) 02/19/2017 1031   BUN 11 08/26/2018 0931   CREATININE 0.79 08/26/2018 0931   CREATININE 0.84 05/20/2012 1010   CALCIUM 9.2 08/26/2018 0931   GFRNONAA 98 08/26/2018 0931   GFRAA 113 08/26/2018 0931   Lab Results  Component  Value Date   HGBA1C 6.4 (H) 08/26/2018   Lab Results  Component Value Date   INSULIN 49.9 (H) 08/26/2018   CBC    Component Value Date/Time   WBC 6.9 08/26/2018 0931   WBC 9.4 07/27/2013 1718   RBC 4.61 08/26/2018 0931   RBC 4.71 07/27/2013 1718   HGB 12.5 08/26/2018 0931   HGB 13.6 07/27/2013 0945   HCT 38.7 08/26/2018 0931   PLT 357 03/26/2018 1601   MCV 84 08/26/2018 0931   MCH 27.1 08/26/2018 0931   MCH 28.9 07/27/2013 1718   MCHC 32.3 08/26/2018 0931   MCHC 34.7 07/27/2013 1718   RDW 15.6 (H) 08/26/2018 0931   LYMPHSABS 3.1 08/26/2018 0931   EOSABS 0.2 08/26/2018 0931   BASOSABS 0.0 08/26/2018 0931   Iron/TIBC/Ferritin/ %Sat    Component Value Date/Time   FERRITIN 20 03/26/2018 1601   Lipid Panel     Component Value Date/Time   CHOL 154 08/26/2018 0931   TRIG 79 08/26/2018 0931   HDL 36 (L) 08/26/2018 0931   LDLCALC 102 (H) 08/26/2018 0931   Hepatic Function Panel     Component Value Date/Time   PROT 7.5 08/26/2018 0931   ALBUMIN 4.3 08/26/2018 0931   AST 18 08/26/2018 0931   ALT 22 08/26/2018 0931   ALKPHOS 84 08/26/2018 0931   BILITOT 0.2 08/26/2018 0931      Component Value Date/Time   TSH 1.660 08/26/2018 0931   TSH 2.570 03/06/2017 0858     Ref. Range 08/26/2018 09:31  Vitamin D, 25-Hydroxy Latest Ref Range: 30.0 - 100.0 ng/mL 54.0    OBESITY BEHAVIORAL INTERVENTION VISIT  Today's visit was # 4   Starting weight: 321 lbs Starting date: 08/26/2018 Today's weight : 315 lbs Today's date: 12/15/2018 Total lbs lost to date: 6    12/15/2018  Height 5' 5"  (1.651 m) 5'5  Weight 315 lb (142.9 kg) (A)  BMI (Calculated) 52.42  BLOOD PRESSURE - SYSTOLIC 161  BLOOD PRESSURE - DIASTOLIC 76   Body Fat % 09.6 %  Total Body Water (lbs) 105.2 lbs    ASK: We discussed the diagnosis of obesity with [name] today and Diane Padilla agreed to give [place] permission to discuss obesity behavioral modification therapy today.  ASSESS: Diane Padilla has the  diagnosis of obesity and her BMI today is 52.42 Diane Padilla is in the action stage of change   ADVISE: Diane Padilla was educated on the multiple health risks of obesity  as well as the benefit of weight loss to improve her health. She was advised of the need for long term treatment and the importance of lifestyle modifications to improve her current health and to decrease her risk of future health problems.  AGREE: Multiple dietary modification options and treatment options were discussed and  Diane Padilla agreed to follow the recommendations documented in the above note.  ARRANGE: Diane Padilla was educated on the importance of frequent visits to treat obesity as outlined per CMS and USPSTF guidelines and agreed to schedule her next follow up appointment today.  I, Doreene Nest, am acting as transcriptionist for Dennard Nip, MD  I have reviewed the above documentation for accuracy and completeness, and I agree with the above. -Dennard Nip, MD

## 2018-12-23 ENCOUNTER — Telehealth (INDEPENDENT_AMBULATORY_CARE_PROVIDER_SITE_OTHER): Payer: Self-pay

## 2018-12-23 NOTE — Telephone Encounter (Signed)
Tingling is gone now, pt denies chest or jaw pain then or now,  denies worsening shortness of breath.  Pt states she is feeling better now.   Please advise.  Lyn Records CMA

## 2018-12-23 NOTE — Telephone Encounter (Signed)
Pt notified and gave verbal understanding of above.

## 2018-12-23 NOTE — Telephone Encounter (Signed)
Please advise this is not a normal side effect of metformin. Is the tingling gone? IS she having chest or jaw pain? Worsening shortness of breath?

## 2018-12-23 NOTE — Telephone Encounter (Signed)
Pt called states she was prescribed Metformin 500 mg TID. She started the first dose this morning at 0800 and around 0900 she started to feel tingling in left arm that progressed to aching and in left arm. She denies any rash or SOB. She states the aching has improved but she still feels the tingling in left hand. Advised to not take anymore doses until Dr. Leafy Ro advises. Renee Ramus, LPN

## 2018-12-23 NOTE — Telephone Encounter (Signed)
Please tell pt to contact her PCP if symptoms return, but she should be ok to restart metformin.

## 2019-01-01 DIAGNOSIS — G4733 Obstructive sleep apnea (adult) (pediatric): Secondary | ICD-10-CM | POA: Diagnosis not present

## 2019-01-06 ENCOUNTER — Ambulatory Visit (INDEPENDENT_AMBULATORY_CARE_PROVIDER_SITE_OTHER): Payer: BC Managed Care – PPO | Admitting: Family Medicine

## 2019-01-19 ENCOUNTER — Ambulatory Visit (INDEPENDENT_AMBULATORY_CARE_PROVIDER_SITE_OTHER): Payer: Self-pay | Admitting: Family Medicine

## 2019-01-19 ENCOUNTER — Other Ambulatory Visit: Payer: Self-pay

## 2019-01-19 ENCOUNTER — Encounter (INDEPENDENT_AMBULATORY_CARE_PROVIDER_SITE_OTHER): Payer: Self-pay | Admitting: Family Medicine

## 2019-01-19 VITALS — BP 141/72 | HR 69 | Temp 98.0°F | Ht 65.0 in | Wt 318.0 lb

## 2019-01-19 DIAGNOSIS — E559 Vitamin D deficiency, unspecified: Secondary | ICD-10-CM

## 2019-01-19 DIAGNOSIS — E7849 Other hyperlipidemia: Secondary | ICD-10-CM | POA: Diagnosis not present

## 2019-01-19 DIAGNOSIS — Z9189 Other specified personal risk factors, not elsewhere classified: Secondary | ICD-10-CM

## 2019-01-19 DIAGNOSIS — E119 Type 2 diabetes mellitus without complications: Secondary | ICD-10-CM

## 2019-01-19 DIAGNOSIS — E1165 Type 2 diabetes mellitus with hyperglycemia: Secondary | ICD-10-CM

## 2019-01-19 DIAGNOSIS — Z6841 Body Mass Index (BMI) 40.0 and over, adult: Secondary | ICD-10-CM

## 2019-01-19 MED ORDER — METFORMIN HCL 500 MG PO TABS: 500.0000 mg | ORAL_TABLET | Freq: Three times a day (TID) | ORAL | 0 refills | Status: DC

## 2019-01-19 NOTE — Progress Notes (Signed)
Office: [phone]  /  Fax: [phone]   HPI:   Chief Complaint: OBESITY Diane Padilla is here to discuss her progress with her obesity treatment plan. She is on the Category 3 plan and is following her eating plan approximately 50-60% of the time. She states she is exercising 0 minutes 0 times per week. Diane Padilla has been struggling to follow her Category 3 plan, especially for dinner. She has decreased her lean protein and vegetables and has increased simple carbs. She is especially struggling with meal planning ideas. Her weight is (!) 318 lb (144.2 kg) today and has had a weight gain of 3 lbs since her last visit. She has lost 3 lbs since starting treatment with [place].  Diabetes II Diane Padilla has a diagnosis of diabetes type II. Diane Padilla does not report checking her blood sugars. Last A1c was 6.4 on 08/26/2018. She is stable on metformin and has been working on diet but is struggling to decrease simple carbs in her diet.   Vitamin D deficiency Diane Padilla has a diagnosis of Vitamin D deficiency. She is currently stable on Vit D 2,000 daily and denies nausea, vomiting or muscle weakness. She is due to have labs checked.  At risk for osteopenia and osteoporosis Diane Padilla is at higher risk of osteopenia and osteoporosis due to Vitamin D deficiency.   Hyperlipidemia Diane Padilla has hyperlipidemia and has been attempting to control with diet. She denies any chest pain. She is due to have labs checked.  ASSESSMENT AND PLAN:  Type 2 diabetes mellitus without complication, without long-term current use of insulin ([place]) - Plan: Comprehensive metabolic panel, Hemoglobin A1c, Insulin, random  Vitamin D deficiency - Plan: VITAMIN D 25 Hydroxy (Vit-D Deficiency, Fractures)  Other hyperlipidemia - Plan: Lipid Panel With LDL/HDL Ratio  At risk for osteoporosis  Class 3 severe obesity with serious comorbidity and body mass index (BMI) of 50.0 to 59.9 in adult, unspecified obesity type ([place])  Type 2  diabetes mellitus with hyperglycemia, without long-term current use of insulin ([place]) - Plan: metFORMIN (GLUCOPHAGE) 500 MG tablet  PLAN:  Diabetes II Diane Padilla has been given extensive diabetes education by myself today including ideal fasting and post-prandial blood glucose readings, individual ideal HgA1c goals  and hypoglycemia prevention. We discussed the importance of good blood sugar control to decrease the likelihood of diabetic complications such as nephropathy, neuropathy, limb loss, blindness, coronary artery disease, and death. We discussed the importance of intensive lifestyle modification including diet, exercise and weight loss as the first line treatment for diabetes. Diane Padilla was given a refill on her metformin 500 mg #90 with 0 refills and agrees to follow-up with our clinic in 2 weeks.  Vitamin D Deficiency Diane Padilla was informed that low Vitamin D levels contributes to fatigue and are associated with obesity, breast, and colon cancer. She agrees to continue taking Vit D and will have routine testing of Vitamin D today. She was informed of the risk of over-replacement of Vitamin D and agrees to not increase her dose unless she discusses this with [place] first. Diane Padilla agrees to follow-up with our clinic in 2 weeks.  At risk for osteopenia and osteoporosis Diane Padilla was given extended  (15 minutes) osteoporosis prevention counseling today. Diane Padilla is at risk for osteopenia and osteoporsis due to her Vitamin D deficiency. She was encouraged to take her Vitamin D and follow her higher calcium diet and increase strengthening exercise to help strengthen her bones and decrease her risk of osteopenia and osteoporosis.  Hyperlipidemia Diane Padilla was informed of  the American Heart Association Guidelines emphasizing intensive lifestyle modifications as the first line treatment for hyperlipidemia. We discussed many lifestyle modifications today in depth, and Diane Padilla will continue to work on  decreasing saturated fats such as fatty red meat, butter and many fried foods. She will have labs checked, increase vegetables and lean protein in her diet, and continue to work on exercise and weight loss efforts.  Obesity Diane Padilla is currently in the action stage of change. As such, her goal is to continue with weight loss efforts. She has agreed to follow the Category 3 plan and journal 400-600 calories and 40+ grams of protein at supper. Diane Padilla has been instructed to work up to a goal of 150 minutes of combined cardio and strengthening exercise per week for weight loss and overall health benefits. We discussed the following Behavioral Modification Strategies today: increasing lean protein intake, decreasing simple carbohydrates, no skipping meals, work on meal planning and easy cooking plans.  Diane Padilla has agreed to follow-up with our clinic in 2 weeks. She was informed of the importance of frequent follow-up visits to maximize her success with intensive lifestyle modifications for her multiple health conditions.  ALLERGIES: No Known Allergies  MEDICATIONS: Current Outpatient Medications on File Prior to Visit  Medication Sig Dispense Refill  . Cholecalciferol (VITAMIN D) 50 MCG (2000 UT) CAPS Take 1 capsule by mouth daily.    . diphenhydrAMINE (BENADRYL ALLERGY) 25 MG tablet Take 25 mg by mouth at bedtime as needed.    . diphenhydramine-acetaminophen (TYLENOL PM) 25-500 MG TABS tablet Take 1 tablet by mouth at bedtime as needed.    . Multiple Vitamin (MULTIVITAMIN) tablet Take 1 tablet by mouth daily.    . vitamin B-12 (CYANOCOBALAMIN) 1000 MCG tablet Take 1,000 mcg by mouth daily.    . vitamin C (ASCORBIC ACID) 500 MG tablet Take 500 mg by mouth daily.     No current facility-administered medications on file prior to visit.     PAST MEDICAL HISTORY: Past Medical History:  Diagnosis Date  . Allergy   . Back pain   . Depression   . Diabetes mellitus without complication ([place])  07/27[birth year]52   no meds at this time  . Dyspnea   . GERD (gastroesophageal reflux disease)   . PCOS (polycystic ovarian syndrome)   . Sleep apnea   . Vitamin D deficiency     PAST SURGICAL HISTORY: Past Surgical History:  Procedure Laterality Date  . WISDOM TOOTH EXTRACTION      SOCIAL HISTORY: Social History   Tobacco Use  . Smoking status: Never Smoker  . Smokeless tobacco: Never Used  Substance Use Topics  . Alcohol use: No  . Drug use: No    FAMILY HISTORY: Family History  Problem Relation Age of Onset  . Diabetes Mother   . Hypertension Mother   . Hyperlipidemia Mother   . Stroke Mother   . Cancer Mother   . Kidney disease Mother   . Sleep apnea Mother   . Drug abuse Mother   . Obesity Mother   . Hypertension Maternal Grandmother   . Hyperlipidemia Maternal Grandmother   . Drug abuse Father   . Breast cancer Cousin 30       chemo is done and upcoming surgery - ?BRCA Negative   ROS: Review of Systems  Cardiovascular: Negative for chest pain.  Gastrointestinal: Negative for nausea and vomiting.  Musculoskeletal:       Negative for muscle weakness.   PHYSICAL EXAM: Blood pressure Marland Kitchen)  141/72, pulse 69, temperature 98 F (36.7 C), temperature source Oral, height 5' 5"  (1.651 m), weight (!) 318 lb (144.2 kg), last menstrual period 06/30/2018, SpO2 98 %. Body mass index is 52.92 kg/m. Physical Exam Vitals signs reviewed.  Constitutional:      Appearance: Normal appearance. She is obese.  Cardiovascular:     Rate and Rhythm: Normal rate.     Pulses: Normal pulses.  Pulmonary:     Effort: Pulmonary effort is normal.     Breath sounds: Normal breath sounds.  Musculoskeletal: Normal range of motion.  Skin:    General: Skin is warm and dry.  Neurological:     Mental Status: She is alert and oriented to person, place, and time.  Psychiatric:        Behavior: Behavior normal.   RECENT LABS AND TESTS: BMET    Component Value Date/Time   NA 135 08/26/2018  0931   K 4.7 08/26/2018 0931   CL 101 08/26/2018 0931   CO2 22 08/26/2018 0931   GLUCOSE 124 (H) 08/26/2018 0931   GLUCOSE 151 (H) 02/19/2017 1031   BUN 11 08/26/2018 0931   CREATININE 0.79 08/26/2018 0931   CREATININE 0.84 05/20/2012 1010   CALCIUM 9.2 08/26/2018 0931   GFRNONAA 98 08/26/2018 0931   GFRAA 113 08/26/2018 0931   Lab Results  Component Value Date   HGBA1C 6.4 (H) 08/26/2018   Lab Results  Component Value Date   INSULIN 49.9 (H) 08/26/2018   CBC    Component Value Date/Time   WBC 6.9 08/26/2018 0931   WBC 9.4 07/27/2013 1718   RBC 4.61 08/26/2018 0931   RBC 4.71 07/27/2013 1718   HGB 12.5 08/26/2018 0931   HGB 13.6 07/27/2013 0945   HCT 38.7 08/26/2018 0931   PLT 357 03/26/2018 1601   MCV 84 08/26/2018 0931   MCH 27.1 08/26/2018 0931   MCH 28.9 07/27/2013 1718   MCHC 32.3 08/26/2018 0931   MCHC 34.7 07/27/2013 1718   RDW 15.6 (H) 08/26/2018 0931   LYMPHSABS 3.1 08/26/2018 0931   EOSABS 0.2 08/26/2018 0931   BASOSABS 0.0 08/26/2018 0931   Iron/TIBC/Ferritin/ %Sat    Component Value Date/Time   FERRITIN 20 03/26/2018 1601   Lipid Panel     Component Value Date/Time   CHOL 154 08/26/2018 0931   TRIG 79 08/26/2018 0931   HDL 36 (L) 08/26/2018 0931   LDLCALC 102 (H) 08/26/2018 0931   Hepatic Function Panel     Component Value Date/Time   PROT 7.5 08/26/2018 0931   ALBUMIN 4.3 08/26/2018 0931   AST 18 08/26/2018 0931   ALT 22 08/26/2018 0931   ALKPHOS 84 08/26/2018 0931   BILITOT 0.2 08/26/2018 0931      Component Value Date/Time   TSH 1.660 08/26/2018 0931   TSH 2.570 03/06/2017 0858   Results for ANGELLICA, MADDISON (MRN [id number]) as of 01/19/2019 11:45  Ref. Range 08/26/2018 09:31  Vitamin D, 25-Hydroxy Latest Ref Range: 30.0 - 100.0 ng/mL 54.0   OBESITY BEHAVIORAL INTERVENTION VISIT  Today's visit was #5  Starting weight: 321 lbs Starting date: 08/26/2018 Today's weight: 318 lbs  Today's date: 01/19/2019 Total lbs lost to date:  3   01/19/2019  Height 5' 5"  (1.651 m)  Weight 318 lb (144.2 kg) (A)  BMI (Calculated) 52.92  BLOOD PRESSURE - SYSTOLIC 979  BLOOD PRESSURE - DIASTOLIC 72   Body Fat % 89.2 %  Total Body Water (lbs) 106.8 lbs   ASK:  We discussed the diagnosis of obesity with [name] today and Diane Padilla agreed to give [place] permission to discuss obesity behavioral modification therapy today.  ASSESS: Diane Padilla has the diagnosis of obesity and her BMI today is 53.0. Diane Padilla is in the action stage of change.   ADVISE: [place] was educated on the multiple health risks of obesity as well as the benefit of weight loss to improve her health. She was advised of the need for long term treatment and the importance of lifestyle modifications to improve her current health and to decrease her risk of future health problems.  AGREE: Multiple dietary modification options and treatment options were discussed and  Diane Padilla agreed to follow the recommendations documented in the above note.  ARRANGE: Diane Padilla was educated on the importance of frequent visits to treat obesity as outlined per CMS and USPSTF guidelines and agreed to schedule her next follow up appointment today.  I, Michaelene Song, am acting as Location manager for Dennard Nip, MD I have reviewed the above documentation for accuracy and completeness, and I agree with the above. -Dennard Nip, MD

## 2019-01-20 ENCOUNTER — Telehealth (INDEPENDENT_AMBULATORY_CARE_PROVIDER_SITE_OTHER): Payer: Self-pay | Admitting: Psychology

## 2019-01-20 LAB — COMPREHENSIVE METABOLIC PANEL
ALT: 27 IU/L (ref 0–32)
AST: 15 IU/L (ref 0–40)
Albumin/Globulin Ratio: 1.4 (ref 1.2–2.2)
Albumin: 4.2 g/dL (ref 3.8–4.8)
Alkaline Phosphatase: 80 IU/L (ref 39–117)
BUN/Creatinine Ratio: 12 (ref 9–23)
BUN: 9 mg/dL (ref 6–20)
Bilirubin Total: 0.2 mg/dL (ref 0.0–1.2)
CO2: 26 mmol/L (ref 20–29)
Calcium: 9.2 mg/dL (ref 8.7–10.2)
Chloride: 100 mmol/L (ref 96–106)
Creatinine, Ser: 0.74 mg/dL (ref 0.57–1.00)
GFR calc Af Amer: 121 mL/min/{1.73_m2} (ref >59)
GFR calc non Af Amer: 105 mL/min/{1.73_m2} (ref >59)
Globulin, Total: 3.1 g/dL (ref 1.5–4.5)
Glucose: 120 mg/dL — ABNORMAL HIGH (ref 65–99)
Potassium: 4.7 mmol/L (ref 3.5–5.2)
Sodium: 138 mmol/L (ref 134–144)
Total Protein: 7.3 g/dL (ref 6.0–8.5)

## 2019-01-20 LAB — LIPID PANEL WITH LDL/HDL RATIO
Cholesterol, Total: 146 mg/dL (ref 100–199)
HDL: 39 mg/dL — ABNORMAL LOW (ref >39)
LDL Calculated: 92 mg/dL (ref 0–99)
LDl/HDL Ratio: 2.4 ratio (ref 0.0–3.2)
Triglycerides: 74 mg/dL (ref 0–149)
VLDL Cholesterol Cal: 15 mg/dL (ref 5–40)

## 2019-01-20 LAB — INSULIN, RANDOM: INSULIN: 57 u[IU]/mL — ABNORMAL HIGH (ref 2.6–24.9)

## 2019-01-20 LAB — VITAMIN D 25 HYDROXY (VIT D DEFICIENCY, FRACTURES): Vit D, 25-Hydroxy: 56.8 ng/mL (ref 30.0–100.0)

## 2019-01-20 LAB — HEMOGLOBIN A1C
Est. average glucose Bld gHb Est-mCnc: 128 mg/dL
Hgb A1c MFr Bld: 6.1 % — ABNORMAL HIGH (ref 4.8–5.6)

## 2019-01-20 NOTE — Telephone Encounter (Signed)
  Office: [phone]  /  Fax: [phone]  Date of Call: January 20, 2019 Time of Call: 10:28am Provider: Glennie Isle, PsyD  CONTENT: This provider called Diane Padilla to check-in and to determine if she would like to schedule a follow-up appointment. A HIPAA compliant voicemail was left requesting a call back.   PLAN: This provider will wait for Diane Padilla to call back. No further follow-up planned.

## 2019-02-01 DIAGNOSIS — G4733 Obstructive sleep apnea (adult) (pediatric): Secondary | ICD-10-CM | POA: Diagnosis not present

## 2019-02-02 ENCOUNTER — Ambulatory Visit (INDEPENDENT_AMBULATORY_CARE_PROVIDER_SITE_OTHER): Payer: Self-pay | Admitting: Family Medicine

## 2019-02-11 DIAGNOSIS — G4733 Obstructive sleep apnea (adult) (pediatric): Secondary | ICD-10-CM | POA: Diagnosis not present

## 2019-02-17 DIAGNOSIS — E1165 Type 2 diabetes mellitus with hyperglycemia: Secondary | ICD-10-CM | POA: Diagnosis not present

## 2019-02-17 DIAGNOSIS — G4700 Insomnia, unspecified: Secondary | ICD-10-CM | POA: Diagnosis not present

## 2019-02-17 DIAGNOSIS — E119 Type 2 diabetes mellitus without complications: Secondary | ICD-10-CM | POA: Diagnosis not present

## 2019-02-24 ENCOUNTER — Other Ambulatory Visit (INDEPENDENT_AMBULATORY_CARE_PROVIDER_SITE_OTHER): Payer: Self-pay | Admitting: Family Medicine

## 2019-02-24 DIAGNOSIS — E1165 Type 2 diabetes mellitus with hyperglycemia: Secondary | ICD-10-CM

## 2019-02-25 ENCOUNTER — Encounter (INDEPENDENT_AMBULATORY_CARE_PROVIDER_SITE_OTHER): Payer: Self-pay | Admitting: Family Medicine

## 2019-03-04 DIAGNOSIS — G4733 Obstructive sleep apnea (adult) (pediatric): Secondary | ICD-10-CM | POA: Diagnosis not present

## 2019-03-05 ENCOUNTER — Ambulatory Visit (INDEPENDENT_AMBULATORY_CARE_PROVIDER_SITE_OTHER): Payer: BC Managed Care – PPO | Admitting: Family Medicine

## 2019-03-05 ENCOUNTER — Encounter (INDEPENDENT_AMBULATORY_CARE_PROVIDER_SITE_OTHER): Payer: Self-pay | Admitting: Family Medicine

## 2019-03-05 ENCOUNTER — Other Ambulatory Visit: Payer: Self-pay

## 2019-03-05 VITALS — BP 128/80 | HR 77 | Temp 98.2°F | Ht 65.0 in | Wt 315.0 lb

## 2019-03-05 DIAGNOSIS — E119 Type 2 diabetes mellitus without complications: Secondary | ICD-10-CM

## 2019-03-05 DIAGNOSIS — Z6841 Body Mass Index (BMI) 40.0 and over, adult: Secondary | ICD-10-CM | POA: Diagnosis not present

## 2019-03-09 NOTE — Progress Notes (Signed)
Office: [phone]  /  Fax: [phone]   HPI:   Chief Complaint: OBESITY Diane Padilla is here to discuss her progress with her obesity treatment plan. She is on the Category 3 plan and is following her eating plan approximately 75 to 80 % of the time. She states she is walking 15 to 30 minutes 2 to 3 times per week. Diane Padilla continues to lose weight, but she is not eating all of her food lately. Hunger is stable, but she is getting bored with the plan. Her weight is (!) 315 lb (142.9 kg) today and has had a weight loss of 3 pounds over a period of 6 weeks since her last visit. She has lost 6 lbs since starting treatment with [place].  Diabetes II Diane Padilla has a diagnosis of diabetes type II. Last A1c was improved at 6.1 with diet and metformin. Her PCP changed her to metformin XL, and she is tolerating it well.  She has been working on intensive lifestyle modifications including diet, exercise, and weight loss to help control her blood glucose levels. Diane Padilla denies nausea, vomiting or hypoglycemia.  ASSESSMENT AND PLAN:  Type 2 diabetes mellitus without complication, without long-term current use of insulin ([facility])  Class 3 severe obesity with serious comorbidity and body mass index (BMI) of 50.0 to 59.9 in adult, unspecified obesity type ([place])  PLAN:  Diabetes II Diane Padilla has been given extensive diabetes education by myself today including ideal fasting and post-prandial blood glucose readings, individual ideal Hgb A1c goals and hypoglycemia prevention. We discussed the importance of good blood sugar control to decrease the likelihood of diabetic complications such as nephropathy, neuropathy, limb loss, blindness, coronary artery disease, and death. We discussed the importance of intensive lifestyle modification including diet, exercise and weight loss as the first line treatment for diabetes. Diane Padilla will continue Metformin as prescribed and she will continue to work on diet and exercise.  Diane Padilla will follow up at the agreed upon time.  I spent > than 50% of the 15 minute visit on counseling as documented in the note.  Obesity Diane Padilla is currently in the action stage of change. As such, her goal is to continue with weight loss efforts She has agreed to change to keeping a food journal with 1300 to 1600 calories and 90+ grams of protein daily Diane Padilla has been instructed to work up to a goal of 150 minutes of combined cardio and strengthening exercise per week for weight loss and overall health benefits. We discussed the following Behavioral Modification Strategies today: keep a strict food journal, increasing lean protein intake, work on meal planning and easy cooking plans and emotional eating strategies  Diane Padilla has agreed to follow up with our clinic in 3 weeks. She was informed of the importance of frequent follow up visits to maximize her success with intensive lifestyle modifications for her multiple health conditions.  ALLERGIES: No Known Allergies  MEDICATIONS: Current Outpatient Medications on File Prior to Visit  Medication Sig Dispense Refill   Cholecalciferol (VITAMIN D) 50 MCG (2000 UT) CAPS Take 1 capsule by mouth daily.     diphenhydrAMINE (BENADRYL ALLERGY) 25 MG tablet Take 25 mg by mouth at bedtime as needed.     diphenhydramine-acetaminophen (TYLENOL PM) 25-500 MG TABS tablet Take 1 tablet by mouth at bedtime as needed.     metFORMIN (GLUCOPHAGE) 500 MG tablet Take 1 tablet (500 mg total) by mouth 3 (three) times daily. 90 tablet 0   Multiple Vitamin (MULTIVITAMIN) tablet Take 1  by mouth daily.    °• vitamin B-12 (CYANOCOBALAMIN) 1000 MCG tablet Take 1,000 mcg by mouth daily.    °• vitamin C (ASCORBIC ACID) 500 MG tablet Take 500 mg by mouth daily.    ° °No current facility-administered medications on file prior to visit.   ° ° °PAST MEDICAL HISTORY: °Past Medical History:  °Diagnosis Date  °• Allergy   °• Back pain   °• Depression   °•  Diabetes mellitus without complication ([facility]) 07/20/13  ° no meds at this time  °• Dyspnea   °• GERD (gastroesophageal reflux disease)   °• PCOS (polycystic ovarian syndrome)   °• Sleep apnea   °• Vitamin D deficiency   ° ° °PAST SURGICAL HISTORY: °Past Surgical History:  °Procedure Laterality Date  °• WISDOM TOOTH EXTRACTION    ° ° °SOCIAL HISTORY: °Social History  ° °Tobacco Use  °• Smoking status: Never Smoker  °• Smokeless tobacco: Never Used  °Substance Use Topics  °• Alcohol use: No  °• Drug use: No  ° ° °FAMILY HISTORY: °Family History  °Problem Relation Age of Onset  °• Diabetes Mother   °• Hypertension Mother   °• Hyperlipidemia Mother   °• Stroke Mother   °• Cancer Mother   °• Kidney disease Mother   °• Sleep apnea Mother   °• Drug abuse Mother   °• Obesity Mother   °• Hypertension Maternal Grandmother   °• Hyperlipidemia Maternal Grandmother   °• Drug abuse Father   °• Breast cancer Cousin 30  °     chemo is done and upcoming surgery - ?BRCA Negative  ° ° °ROS: °Review of Systems  °Constitutional: Positive for weight loss.  °Gastrointestinal: Negative for nausea and vomiting.  °Endo/Heme/Allergies:  °     Negative for hypoglycemia  ° ° °PHYSICAL EXAM: °Blood pressure 128/80, pulse 77, temperature 98.2 °F (36.8 °C), temperature source Oral, height 5' 5" (1.651 m), weight (!) 315 lb (142.9 kg), SpO2 99 %. °Body mass index is 52.42 kg/m². °Physical Exam °Vitals signs reviewed.  °Constitutional:   °   Appearance: Normal appearance. She is well-developed. She is obese.  °Cardiovascular:  °   Rate and Rhythm: Normal rate.  °Pulmonary:  °   Effort: Pulmonary effort is normal.  °Musculoskeletal: Normal range of motion.  °Skin: °   General: Skin is warm and dry.  °Neurological:  °   Mental Status: She is alert and oriented to person, place, and time.  °Psychiatric:     °   Mood and Affect: Mood normal.     °   Behavior: Behavior normal.  ° ° ° °RECENT LABS AND TESTS: °BMET °   °Component Value Date/Time  ° NA 138  01/19/2019 1219  ° K 4.7 01/19/2019 1219  ° CL 100 01/19/2019 1219  ° CO2 26 01/19/2019 1219  ° GLUCOSE 120 (H) 01/19/2019 1219  ° GLUCOSE 151 (H) 02/19/2017 1031  ° BUN 9 01/19/2019 1219  ° CREATININE 0.74 01/19/2019 1219  ° CREATININE 0.84 05/20/2012 1010  ° CALCIUM 9.2 01/19/2019 1219  ° GFRNONAA 105 01/19/2019 1219  ° GFRAA 121 01/19/2019 1219  ° °Lab Results  °Component Value Date  ° HGBA1C 6.1 (H) 01/19/2019  ° HGBA1C 6.4 (H) 08/26/2018  ° °Lab Results  °Component Value Date  ° INSULIN 57.0 (H) 01/19/2019  ° INSULIN 49.9 (H) 08/26/2018  ° °CBC °   °Component Value Date/Time  ° WBC 6.9 08/26/2018 0931  ° WBC 9.4 07/27/2013 1718  °   RBC 4.61 08/26/2018 0931   RBC 4.71 07/27/2013 1718   HGB 12.5 08/26/2018 0931   HGB 13.6 07/27/2013 0945   HCT 38.7 08/26/2018 0931   PLT 357 03/26/2018 1601   MCV 84 08/26/2018 0931   MCH 27.1 08/26/2018 0931   MCH 28.9 07/27/2013 1718   MCHC 32.3 08/26/2018 0931   MCHC 34.7 07/27/2013 1718   RDW 15.6 (H) 08/26/2018 0931   LYMPHSABS 3.1 08/26/2018 0931   EOSABS 0.2 08/26/2018 0931   BASOSABS 0.0 08/26/2018 0931   Iron/TIBC/Ferritin/ %Sat    Component Value Date/Time   FERRITIN 20 03/26/2018 1601   Lipid Panel     Component Value Date/Time   CHOL 146 01/19/2019 1219   TRIG 74 01/19/2019 1219   HDL 39 (L) 01/19/2019 1219   LDLCALC 92 01/19/2019 1219   Hepatic Function Panel     Component Value Date/Time   PROT 7.3 01/19/2019 1219   ALBUMIN 4.2 01/19/2019 1219   AST 15 01/19/2019 1219   ALT 27 01/19/2019 1219   ALKPHOS 80 01/19/2019 1219   BILITOT 0.2 01/19/2019 1219      Component Value Date/Time   TSH 1.660 08/26/2018 0931   TSH 2.570 03/06/2017 0858     Ref. Range 01/19/2019 12:19  Vitamin D, 25-Hydroxy Latest Ref Range: 30.0 - 100.0 ng/mL 56.8    OBESITY BEHAVIORAL INTERVENTION VISIT  Today's visit was # 6   Starting weight: 321 lbs Starting date: 08/26/2018 Today's weight : 315 lbs  Today's date: 03/09/2019 Total lbs lost to  date: 6    03/05/2019  Height 5' 5" (1.651 m)  Weight 315 lb (142.9 kg) (A)  BMI (Calculated) 52.42  BLOOD PRESSURE - SYSTOLIC 720  BLOOD PRESSURE - DIASTOLIC 80   Body Fat % 94.7 %  Total Body Water (lbs) 105.8 lbs    ASK: We discussed the diagnosis of obesity with [name] today and Diane Padilla agreed to give [place] permission to discuss obesity behavioral modification therapy today.  ASSESS: Diane Padilla has the diagnosis of obesity and her BMI today is 52.42 Diane Padilla is in the action stage of change   ADVISE: Diane Padilla was educated on the multiple health risks of obesity as well as the benefit of weight loss to improve her health. She was advised of the need for long term treatment and the importance of lifestyle modifications to improve her current health and to decrease her risk of future health problems.  AGREE: Multiple dietary modification options and treatment options were discussed and  Diane Padilla agreed to follow the recommendations documented in the above note.  ARRANGE: Diane Padilla was educated on the importance of frequent visits to treat obesity as outlined per CMS and USPSTF guidelines and agreed to schedule her next follow up appointment today.  I, Doreene Nest, am acting as transcriptionist for Dennard Nip, MD  I have reviewed the above documentation for accuracy and completeness, and I agree with the above. -Dennard Nip, MD

## 2019-03-26 ENCOUNTER — Encounter (INDEPENDENT_AMBULATORY_CARE_PROVIDER_SITE_OTHER): Payer: Self-pay | Admitting: Family Medicine

## 2019-03-26 ENCOUNTER — Ambulatory Visit (INDEPENDENT_AMBULATORY_CARE_PROVIDER_SITE_OTHER): Payer: BC Managed Care – PPO | Admitting: Family Medicine

## 2019-03-26 ENCOUNTER — Other Ambulatory Visit: Payer: Self-pay

## 2019-03-26 VITALS — BP 143/89 | HR 93 | Temp 97.9°F | Ht 65.0 in | Wt 313.0 lb

## 2019-03-26 DIAGNOSIS — R030 Elevated blood-pressure reading, without diagnosis of hypertension: Secondary | ICD-10-CM | POA: Diagnosis not present

## 2019-03-26 DIAGNOSIS — Z6841 Body Mass Index (BMI) 40.0 and over, adult: Secondary | ICD-10-CM

## 2019-03-30 NOTE — Progress Notes (Signed)
Office: [phone]  /  Fax: [phone]   HPI:   Chief Complaint: OBESITY Diane Padilla is here to discuss her progress with her obesity treatment plan. She is on the Category 3 plan and is following her eating plan approximately 75 % of the time. She states she is walking more and doing more stairs.Diane Padilla continues to do well with weight loss on her plan. She is going back and forth between the Category 3 plan and journaling. She is struggling to meet her protein goals, but she is doing well with calories. Her weight is (!) 313 lb (142 kg) today and has had a weight loss of 2 pounds over a period of 3 weeks since her last visit. She has lost 8 lbs since starting treatment with [place].  Elevated Blood Pressure Nirali's blood pressure is elevated today (143/89) and it is no normally elevated. Patient was in a rush to be on time, and she feels this is why her blood pressure is elevated. Diane Padilla denies chest pain.  ASSESSMENT AND PLAN:  Elevated blood pressure reading  Class 3 severe obesity with serious comorbidity and body mass index (BMI) of 50.0 to 59.9 in adult, unspecified obesity type ([place])  PLAN:  Elevated Blood Pressure Diane Padilla will continue working on diet and exercise. We will recheck her blood pressure in two to three weeks. We will continue to monitor her blood pressure as well as her progress with the above lifestyle modifications.   I spent > than 50% of the 15 minute visit on counseling as documented in the note.  Obesity Diane Padilla is currently in the action stage of change. As such, her goal is to continue with weight loss efforts She has agreed to keep a food journal with 1300 to 1600 calories and 90+ grams of protein daily or follow the Category 3 plan Diane Padilla has been instructed to work up to a goal of 150 minutes of combined cardio and strengthening exercise per week for weight loss and overall health benefits. We discussed the following Behavioral Modification  Strategies today: increasing lean protein intake and work on meal planning and easy cooking plans  Diane Padilla has agreed to follow up with our clinic in 2 to 3 weeks. She was informed of the importance of frequent follow up visits to maximize her success with intensive lifestyle modifications for her multiple health conditions.  ALLERGIES: No Known Allergies  MEDICATIONS: Current Outpatient Medications on File Prior to Visit  Medication Sig Dispense Refill  . Cholecalciferol (VITAMIN D) 50 MCG (2000 UT) CAPS Take 1 capsule by mouth daily.    . diphenhydrAMINE (BENADRYL ALLERGY) 25 MG tablet Take 25 mg by mouth at bedtime as needed.    . diphenhydramine-acetaminophen (TYLENOL PM) 25-500 MG TABS tablet Take 1 tablet by mouth at bedtime as needed.    . metFORMIN (GLUCOPHAGE-XR) 500 MG 24 hr tablet Take 500 mg by mouth. Take one tablet by mouth at breakfast and 2 tablets at dinner    . Multiple Vitamin (MULTIVITAMIN) tablet Take 1 tablet by mouth daily.    . vitamin B-12 (CYANOCOBALAMIN) 1000 MCG tablet Take 1,000 mcg by mouth daily.    . vitamin C (ASCORBIC ACID) 500 MG tablet Take 500 mg by mouth daily.     No current facility-administered medications on file prior to visit.     PAST MEDICAL HISTORY: Past Medical History:  Diagnosis Date  . Allergy   . Back pain   . Depression   . Diabetes mellitus without complication (  [place]) 07/20/13   no meds at this time  . Dyspnea   . GERD (gastroesophageal reflux disease)   . PCOS (polycystic ovarian syndrome)   . Sleep apnea   . Vitamin D deficiency     PAST SURGICAL HISTORY: Past Surgical History:  Procedure Laterality Date  . WISDOM TOOTH EXTRACTION      SOCIAL HISTORY: Social History   Tobacco Use  . Smoking status: Never Smoker  . Smokeless tobacco: Never Used  Substance Use Topics  . Alcohol use: No  . Drug use: No    FAMILY HISTORY: Family History  Problem Relation Age of Onset  . Diabetes Mother   . Hypertension Mother    . Hyperlipidemia Mother   . Stroke Mother   . Cancer Mother   . Kidney disease Mother   . Sleep apnea Mother   . Drug abuse Mother   . Obesity Mother   . Hypertension Maternal Grandmother   . Hyperlipidemia Maternal Grandmother   . Drug abuse Father   . Breast cancer Cousin 30       chemo is done and upcoming surgery - ?BRCA Negative    ROS: Review of Systems  Constitutional: Positive for weight loss.  Cardiovascular: Negative for chest pain.    PHYSICAL EXAM: Blood pressure (!) 143/89, pulse 93, temperature 97.9 F (36.6 C), temperature source Oral, height 5' 5"  (1.651 m), weight (!) 313 lb (142 kg), last menstrual period 01/26/2019, SpO2 98 %. Body mass index is 52.09 kg/m. Physical Exam Vitals signs reviewed.  Constitutional:      Appearance: Normal appearance. She is well-developed. She is obese.  Cardiovascular:     Rate and Rhythm: Normal rate.  Pulmonary:     Effort: Pulmonary effort is normal.  Musculoskeletal: Normal range of motion.  Skin:    General: Skin is warm and dry.  Neurological:     Mental Status: She is alert and oriented to person, place, and time.  Psychiatric:        Mood and Affect: Mood normal.        Behavior: Behavior normal.     RECENT LABS AND TESTS: BMET    Component Value Date/Time   NA 138 01/19/2019 1219   K 4.7 01/19/2019 1219   CL 100 01/19/2019 1219   CO2 26 01/19/2019 1219   GLUCOSE 120 (H) 01/19/2019 1219   GLUCOSE 151 (H) 02/19/2017 1031   BUN 9 01/19/2019 1219   CREATININE 0.74 01/19/2019 1219   CREATININE 0.84 05/20/2012 1010   CALCIUM 9.2 01/19/2019 1219   GFRNONAA 105 01/19/2019 1219   GFRAA 121 01/19/2019 1219   Lab Results  Component Value Date   HGBA1C 6.1 (H) 01/19/2019   HGBA1C 6.4 (H) 08/26/2018   Lab Results  Component Value Date   INSULIN 57.0 (H) 01/19/2019   INSULIN 49.9 (H) 08/26/2018   CBC    Component Value Date/Time   WBC 6.9 08/26/2018 0931   WBC 9.4 07/27/2013 1718   RBC 4.61  08/26/2018 0931   RBC 4.71 07/27/2013 1718   HGB 12.5 08/26/2018 0931   HGB 13.6 07/27/2013 0945   HCT 38.7 08/26/2018 0931   PLT 357 03/26/2018 1601   MCV 84 08/26/2018 0931   MCH 27.1 08/26/2018 0931   MCH 28.9 07/27/2013 1718   MCHC 32.3 08/26/2018 0931   MCHC 34.7 07/27/2013 1718   RDW 15.6 (H) 08/26/2018 0931   LYMPHSABS 3.1 08/26/2018 0931   EOSABS 0.2 08/26/2018 0931   BASOSABS 0.0  08/26/2018 0931   Iron/TIBC/Ferritin/ %Sat    Component Value Date/Time   FERRITIN 20 03/26/2018 1601   Lipid Panel     Component Value Date/Time   CHOL 146 01/19/2019 1219   TRIG 74 01/19/2019 1219   HDL 39 (L) 01/19/2019 1219   LDLCALC 92 01/19/2019 1219   Hepatic Function Panel     Component Value Date/Time   PROT 7.3 01/19/2019 1219   ALBUMIN 4.2 01/19/2019 1219   AST 15 01/19/2019 1219   ALT 27 01/19/2019 1219   ALKPHOS 80 01/19/2019 1219   BILITOT 0.2 01/19/2019 1219      Component Value Date/Time   TSH 1.660 08/26/2018 0931   TSH 2.570 03/06/2017 0858     Ref. Range 01/19/2019 12:19  Vitamin D, 25-Hydroxy Latest Ref Range: 30.0 - 100.0 ng/mL 56.8    OBESITY BEHAVIORAL INTERVENTION VISIT  Today's visit was # 7   Starting weight: 321 lbs Starting date: 08/26/2018 Today's weight : 313 lbs Today's date: 03/26/2019 Total lbs lost to date: 8    03/26/2019  Height 5' 5"  (1.651 m)  Weight 313 lb (142 kg) (A)  BMI (Calculated) 52.09  BLOOD PRESSURE - SYSTOLIC 324  BLOOD PRESSURE - DIASTOLIC 89   Body Fat % 40.1 %  Total Body Water (lbs) 107.8 lbs    ASK: We discussed the diagnosis of obesity with [name] today and Diane Padilla agreed to give [place] permission to discuss obesity behavioral modification therapy today.  ASSESS: Diane Padilla has the diagnosis of obesity and her BMI today is 52.09 Diane Padilla is in the action stage of change   ADVISE: Diane Padilla was educated on the multiple health risks of obesity as well as the benefit of weight loss to improve her health.  She was advised of the need for long term treatment and the importance of lifestyle modifications to improve her current health and to decrease her risk of future health problems.  AGREE: Multiple dietary modification options and treatment options were discussed and  Diane Padilla agreed to follow the recommendations documented in the above note.  ARRANGE: Diane Padilla was educated on the importance of frequent visits to treat obesity as outlined per CMS and USPSTF guidelines and agreed to schedule her next follow up appointment today.  I, Doreene Nest, am acting as transcriptionist for Dennard Nip, MD I have reviewed the above documentation for accuracy and completeness, and I agree with the above. -Dennard Nip, MD

## 2019-04-01 ENCOUNTER — Other Ambulatory Visit: Payer: Self-pay

## 2019-04-01 ENCOUNTER — Ambulatory Visit (INDEPENDENT_AMBULATORY_CARE_PROVIDER_SITE_OTHER): Payer: BC Managed Care – PPO | Admitting: Obstetrics and Gynecology

## 2019-04-01 ENCOUNTER — Encounter: Payer: Self-pay | Admitting: Obstetrics and Gynecology

## 2019-04-01 VITALS — BP 132/86 | HR 80 | Temp 97.2°F | Ht 65.0 in | Wt 317.0 lb

## 2019-04-01 DIAGNOSIS — N914 Secondary oligomenorrhea: Secondary | ICD-10-CM | POA: Diagnosis not present

## 2019-04-01 DIAGNOSIS — N939 Abnormal uterine and vaginal bleeding, unspecified: Secondary | ICD-10-CM

## 2019-04-01 DIAGNOSIS — Z6841 Body Mass Index (BMI) 40.0 and over, adult: Secondary | ICD-10-CM

## 2019-04-01 DIAGNOSIS — Z3009 Encounter for other general counseling and advice on contraception: Secondary | ICD-10-CM | POA: Diagnosis not present

## 2019-04-01 DIAGNOSIS — Z01419 Encounter for gynecological examination (general) (routine) without abnormal findings: Secondary | ICD-10-CM

## 2019-04-01 LAB — POCT URINE PREGNANCY: Preg Test, Ur: NEGATIVE

## 2019-04-01 MED ORDER — MEDROXYPROGESTERONE ACETATE 5 MG PO TABS: ORAL_TABLET | ORAL | 3 refills | Status: DC

## 2019-04-01 NOTE — Progress Notes (Signed)
33 y.o. [place] Married Black or [place] American Not Hispanic or Latino female here for annual exam. Does not want Flu vaccine. H/O oligomenorrhea, prior negative evaluation. She has script for cyclic provera. She was taking it monthly, then her cycle came on it's own on 01/26/19, she bleed until 03/25/19. Bleeding went from light to medium to heavy. At most changing her pad every 2-3 hours. She has been taking iron. With the provera she would bleed for a week. She was taking it monthly at the same time.   She wants to loose weight and then get pregnant, but isn't using contraception. Some entry dyspareunia, not using a lubricant. Last sexually active was last month.   She is going to a medical weight loss clinic, started ~6 months ago, with the pandemic she wasn't going as much. She just had an appointment last week. She has lost 15-17 lbs.   Diabetes is under control, last HgbA1C was 6.2%.   Period Duration (Days): 59 days Period Pattern: (!) Irregular Menstrual Flow: Light, Moderate, Heavy Menstrual Control: Thin pad Menstrual Control Change Freq (Hours): changes pad every 2-3 hours on heavy days, changes pad every 4-6 hours on lighter days Dysmenorrhea: (!) Mild Dysmenorrhea Symptoms: Cramping  Patient's last menstrual period was 01/26/2019 (exact date).          Sexually active: Yes.    The current method of family planning is none.    Exercising: Yes.    walking Smoker:  no  Health Maintenance: Pap:  03/27/2018 WNL NEG HPV, 11/16/2014 normal with negative HPV History of abnormal Pap:  no MMG:  03/15/2017 bilateral diagnostic mammogram with right breast ultrasound birads 1 negative TDaP:  10/21/2009 Gardasil: None    reports that she has never smoked. She has never used smokeless tobacco. She reports that she does not drink alcohol or use drugs. 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   Past Medical History:  Diagnosis Date  . Allergy   . Back pain   . Depression   .  Diabetes mellitus without complication ([place]) 4/65/68   no meds at this time  . Dyspnea   . GERD (gastroesophageal reflux disease)   . PCOS (polycystic ovarian syndrome)   . Sleep apnea   . Vitamin D deficiency     Past Surgical History:  Procedure Laterality Date  . WISDOM TOOTH EXTRACTION      Current Outpatient Medications  Medication Sig Dispense Refill  . Cholecalciferol (VITAMIN D) 50 MCG (2000 UT) CAPS Take 1 capsule by mouth daily.    Marland Kitchen CINNAMON PO Take by mouth.    . diphenhydrAMINE (BENADRYL ALLERGY) 25 MG tablet Take 25 mg by mouth at bedtime as needed.    . diphenhydramine-acetaminophen (TYLENOL PM) 25-500 MG TABS tablet Take 1 tablet by mouth at bedtime as needed.    . metFORMIN (GLUCOPHAGE-XR) 500 MG 24 hr tablet Take 500 mg by mouth. Take one tablet by mouth at breakfast and 2 tablets at dinner    . Multiple Vitamin (MULTIVITAMIN) tablet Take 1 tablet by mouth daily.    . vitamin B-12 (CYANOCOBALAMIN) 1000 MCG tablet Take 1,000 mcg by mouth daily.    . vitamin C (ASCORBIC ACID) 500 MG tablet Take 500 mg by mouth daily.     No current facility-administered medications for this visit.     Family History  Problem Relation Age of Onset  . Diabetes Mother   . Hypertension Mother   . Hyperlipidemia Mother   . Stroke  Mother   . Cancer Mother   . Kidney disease Mother   . Sleep apnea Mother   . Drug abuse Mother   . Obesity Mother   . Hypertension Maternal Grandmother   . Hyperlipidemia Maternal Grandmother   . Drug abuse Father   . Breast cancer Cousin 30       chemo is done and upcoming surgery - ?BRCA Negative    Review of Systems  Constitutional: Negative.   HENT: Negative.   Eyes: Negative.   Respiratory: Negative.   Cardiovascular: Negative.   Gastrointestinal: Negative.   Endocrine: Negative.   Genitourinary: Positive for menstrual problem.  Musculoskeletal: Negative.   Skin: Negative.   Allergic/Immunologic: Negative.   Neurological: Negative.    Hematological: Negative.   Psychiatric/Behavioral: Negative.     Exam:   BP 132/86 (BP Location: Right Arm, Patient Position: Sitting, Cuff Size: Large)   Pulse 80   Temp (!) 97.2 F (36.2 C) (Temporal)   Ht 5' 5"  (1.651 m)   Wt (!) 317 lb (143.8 kg)   LMP 01/26/2019 (Exact Date)   BMI 52.75 kg/m   Weight change: @WEIGHTCHANGE @ Height:   Height: 5' 5"  (165.1 cm)  Ht Readings from Last 3 Encounters:  04/01/19 5' 5"  (1.651 m)  03/26/19 5' 5"  (1.651 m)  03/05/19 5' 5"  (1.651 m)    General appearance: alert, cooperative and appears stated age Head: Normocephalic, without obvious abnormality, atraumatic Neck: no adenopathy, supple, symmetrical, trachea midline and thyroid normal to inspection and palpation Lungs: clear to auscultation bilaterally Cardiovascular: regular rate and rhythm Breasts: normal appearance, no masses or tenderness Abdomen: soft, non-tender; non distended,  no masses,  no organomegaly Extremities: extremities normal, atraumatic, no cyanosis or edema Skin: Skin color, texture, turgor normal. No rashes or lesions Lymph nodes: Cervical, supraclavicular, and axillary nodes normal. No abnormal inguinal nodes palpated Neurologic: Grossly normal   Pelvic: External genitalia:  no lesions              Urethra:  normal appearing urethra with no masses, tenderness or lesions              Bartholins and Skenes: normal                 Vagina: normal appearing vagina with normal color and discharge, no lesions              Cervix: no lesions               Bimanual Exam:  Uterus:  no masses or tenderness, limited by BMI              Adnexa: no mass, fullness, tenderness               Rectovaginal: Confirms               Anus:  normal sphincter tone, no lesions  Chaperone was present for exam.  A:  Well Woman with normal exam  BMI 52  H/O oligomenorrhea, just had a 2 month anovulatory bleed  Not using contraception  P:   No pap this year  UPT now  Recommended  endometrial biopsy, she declines.   Start PNV  Will take the provera monthly x 5 days  Calendar cycles, f/u in 3 months  Use protection for 2 weeks prior to taking provera and take a UPT prior to taking  Labs with primary  Will check CBC, Ferritin and TSH with her recent abnormal bleed  Discussed breast  self exam  Discussed calcium and vit D intake  Labs with primary MD  Working on weight loss

## 2019-04-01 NOTE — Addendum Note (Signed)
Addended by: Reesa Chew E on: 04/01/2019 11:10 AM   Modules accepted: Orders

## 2019-04-01 NOTE — Patient Instructions (Signed)
Start PNV   EXERCISE AND DIET:  We recommended that you start or continue a regular exercise program for good health. Regular exercise means any activity that makes your heart beat faster and makes you sweat.  We recommend exercising at least 30 minutes per day at least 3 days a week, preferably 4 or 5.  We also recommend a diet low in fat and sugar.  Inactivity, poor dietary choices and obesity can cause diabetes, heart attack, stroke, and kidney damage, among others.    ALCOHOL AND SMOKING:  Women should limit their alcohol intake to no more than 7 drinks/beers/glasses of wine (combined, not each!) per week. Moderation of alcohol intake to this level decreases your risk of breast cancer and liver damage. And of course, no recreational drugs are part of a healthy lifestyle.  And absolutely no smoking or even second hand smoke. Most people know smoking can cause heart and lung diseases, but did you know it also contributes to weakening of your bones? Aging of your skin?  Yellowing of your teeth and nails?  CALCIUM AND VITAMIN D:  Adequate intake of calcium and Vitamin D are recommended.  The recommendations for exact amounts of these supplements seem to change often, but generally speaking 1,000 mg of calcium (between diet and supplement) and 800 units of Vitamin D per day seems prudent. Certain women may benefit from higher intake of Vitamin D.  If you are among these women, your doctor will have told you during your visit.    PAP SMEARS:  Pap smears, to check for cervical cancer or precancers,  have traditionally been done yearly, although recent scientific advances have shown that most women can have pap smears less often.  However, every woman still should have a physical exam from her gynecologist every year. It will include a breast check, inspection of the vulva and vagina to check for abnormal growths or skin changes, a visual exam of the cervix, and then an exam to evaluate the size and shape of  the uterus and ovaries.  And after 35 years of age, a rectal exam is indicated to check for rectal cancers. We will also provide age appropriate advice regarding health maintenance, like when you should have certain vaccines, screening for sexually transmitted diseases, bone density testing, colonoscopy, mammograms, etc.   MAMMOGRAMS:  All women over 19 years old should have a yearly mammogram. Many facilities now offer a "3D" mammogram, which may cost around $50 extra out of pocket. If possible,  we recommend you accept the option to have the 3D mammogram performed.  It both reduces the number of women who will be called back for extra views which then turn out to be normal, and it is better than the routine mammogram at detecting truly abnormal areas.    COLON CANCER SCREENING: Now recommend starting at age 75. At this time colonoscopy is not covered for routine screening until 50. There are take home tests that can be done between 45-49.   COLONOSCOPY:  Colonoscopy to screen for colon cancer is recommended for all women at age 62.  We know, you hate the idea of the prep.  We agree, BUT, having colon cancer and not knowing it is worse!!  Colon cancer so often starts as a polyp that can be seen and removed at colonscopy, which can quite literally save your life!  And if your first colonoscopy is normal and you have no family history of colon cancer, most women don't have to  have it again for 10 years.  Once every ten years, you can do something that may end up saving your life, right?  We will be happy to help you get it scheduled when you are ready.  Be sure to check your insurance coverage so you understand how much it will cost.  It may be covered as a preventative service at no cost, but you should check your particular policy.      Breast Self-Awareness Breast self-awareness means being familiar with how your breasts look and feel. It involves checking your breasts regularly and reporting any  changes to your health care provider. Practicing breast self-awareness is important. A change in your breasts can be a sign of a serious medical problem. Being familiar with how your breasts look and feel allows you to find any problems early, when treatment is more likely to be successful. All women should practice breast self-awareness, including women who have had breast implants. How to do a breast self-exam One way to learn what is normal for your breasts and whether your breasts are changing is to do a breast self-exam. To do a breast self-exam: Look for Changes  1. Remove all the clothing above your waist. 2. Stand in front of a mirror in a room with good lighting. 3. Put your hands on your hips. 4. Push your hands firmly downward. 5. Compare your breasts in the mirror. Look for differences between them (asymmetry), such as: ? Differences in shape. ? Differences in size. ? Puckers, dips, and bumps in one breast and not the other. 6. Look at each breast for changes in your skin, such as: ? Redness. ? Scaly areas. 7. Look for changes in your nipples, such as: ? Discharge. ? Bleeding. ? Dimpling. ? Redness. ? A change in position. Feel for Changes Carefully feel your breasts for lumps and changes. It is best to do this while lying on your back on the floor and again while sitting or standing in the shower or tub with soapy water on your skin. Feel each breast in the following way:  Place the arm on the side of the breast you are examining above your head.  Feel your breast with the other hand.  Start in the nipple area and make  inch (2 cm) overlapping circles to feel your breast. Use the pads of your three middle fingers to do this. Apply light pressure, then medium pressure, then firm pressure. The light pressure will allow you to feel the tissue closest to the skin. The medium pressure will allow you to feel the tissue that is a little deeper. The firm pressure will allow you to  feel the tissue close to the ribs.  Continue the overlapping circles, moving downward over the breast until you feel your ribs below your breast.  Move one finger-width toward the center of the body. Continue to use the  inch (2 cm) overlapping circles to feel your breast as you move slowly up toward your collarbone.  Continue the up and down exam using all three pressures until you reach your armpit.  Write Down What You Find  Write down what is normal for each breast and any changes that you find. Keep a written record with breast changes or normal findings for each breast. By writing this information down, you do not need to depend only on memory for size, tenderness, or location. Write down where you are in your menstrual cycle, if you are still menstruating. If you are  having trouble noticing differences in your breasts, do not get discouraged. With time you will become more familiar with the variations in your breasts and more comfortable with the exam. How often should I examine my breasts? Examine your breasts every month. If you are breastfeeding, the best time to examine your breasts is after a feeding or after using a breast pump. If you menstruate, the best time to examine your breasts is 5-7 days after your period is over. During your period, your breasts are lumpier, and it may be more difficult to notice changes. When should I see my health care provider? See your health care provider if you notice:  A change in shape or size of your breasts or nipples.  A change in the skin of your breast or nipples, such as a reddened or scaly area.  Unusual discharge from your nipples.  A lump or thick area that was not there before.  Pain in your breasts.  Anything that concerns you.

## 2019-04-02 LAB — CBC
Hematocrit: 38.7 % (ref 34.0–46.6)
Hemoglobin: 13.2 g/dL (ref 11.1–15.9)
MCH: 28.3 pg (ref 26.6–33.0)
MCHC: 34.1 g/dL (ref 31.5–35.7)
MCV: 83 fL (ref 79–97)
Platelets: 463 10*3/uL — ABNORMAL HIGH (ref 150–450)
RBC: 4.66 x10E6/uL (ref 3.77–5.28)
RDW: 14 % (ref 11.7–15.4)
WBC: 8.6 10*3/uL (ref 3.4–10.8)

## 2019-04-02 LAB — TSH: TSH: 1.99 u[IU]/mL (ref 0.450–4.500)

## 2019-04-02 LAB — FERRITIN: Ferritin: 56 ng/mL (ref 15–150)

## 2019-04-03 DIAGNOSIS — G4733 Obstructive sleep apnea (adult) (pediatric): Secondary | ICD-10-CM | POA: Diagnosis not present

## 2019-04-16 ENCOUNTER — Encounter (INDEPENDENT_AMBULATORY_CARE_PROVIDER_SITE_OTHER): Payer: Self-pay | Admitting: Family Medicine

## 2019-04-16 ENCOUNTER — Other Ambulatory Visit: Payer: Self-pay

## 2019-04-16 ENCOUNTER — Ambulatory Visit (INDEPENDENT_AMBULATORY_CARE_PROVIDER_SITE_OTHER): Payer: BC Managed Care – PPO | Admitting: Family Medicine

## 2019-04-16 VITALS — BP 129/79 | HR 82 | Temp 98.4°F | Ht 65.0 in | Wt 314.0 lb

## 2019-04-16 DIAGNOSIS — Z6841 Body Mass Index (BMI) 40.0 and over, adult: Secondary | ICD-10-CM | POA: Diagnosis not present

## 2019-04-16 DIAGNOSIS — Z9189 Other specified personal risk factors, not elsewhere classified: Secondary | ICD-10-CM | POA: Diagnosis not present

## 2019-04-16 DIAGNOSIS — F3289 Other specified depressive episodes: Secondary | ICD-10-CM

## 2019-04-19 NOTE — Progress Notes (Signed)
Office: [phone]  /  Fax: [phone]   HPI:   Chief Complaint: OBESITY Diane Padilla is here to discuss her progress with her obesity treatment plan. She is on the keep a food journal with 1300-1600 calories and 90+ grams of protein daily or follow the Category 3 plan and is following her eating plan approximately 75 % of the time. She states she is walking for 15-30 minutes 2-3 times per week. Diane Padilla has had extra stress at work and has had increased temptations from candy from her co-worker. She is working on meal planning and feels she is doing well with this.  Her weight is (!) 314 lb (142.4 kg) today and has gained 1 lb since her last visit. She has lost 7 lbs since starting treatment with [place].  Depression with Emotional Eating Behaviors Diane Padilla is struggling with increased stress and comfort eating, especially at work. She denies a history of seizure disorders. Diane Padilla struggles with emotional eating and using food for comfort to the extent that it is negatively impacting her health. She often snacks when she is not hungry. Diane Padilla sometimes feels she is out of control and then feels guilty that she made poor food choices. She has been working on behavior modification techniques to help reduce her emotional eating and has been somewhat successful. She shows no sign of suicidal or homicidal ideations.  Depression screen [facility] 2/9 09/08/2018 08/26/2018  Decreased Interest 0 2  Down, Depressed, Hopeless 1 3  PHQ - 2 Score 1 5  Altered sleeping 2 3  Tired, decreased energy 2 3  Change in appetite 1 3  Feeling bad or failure about yourself  0 2  Trouble concentrating 0 1  Moving slowly or fidgety/restless 0 1  Suicidal thoughts 0 1  PHQ-9 Score 6 19  Difficult doing work/chores - Somewhat difficult    At risk for cardiovascular disease Diane Padilla is at a higher than average risk for cardiovascular disease due to obesity. She currently denies any chest pain.  ASSESSMENT AND PLAN:   Other depression - with emotional eating - Plan: buPROPion (WELLBUTRIN SR) 150 MG 12 hr tablet  At risk for heart disease  Class 3 severe obesity with serious comorbidity and body mass index (BMI) of 50.0 to 59.9 in adult, unspecified obesity type ([place])  PLAN:  Depression with Emotional Eating Behaviors We discussed behavior modification techniques today to help Diane Padilla deal with her emotional eating and depression. Diane Padilla agrees to start Wellbutrin SR 150 mg PO q AM #30 with no refills. Diane Padilla agrees to follow up with our clinic in 2 weeks.  Cardiovascular risk counseling Diane Padilla was given extended (15 minutes) coronary artery disease prevention counseling today. She is 33 y.o. female and has risk factors for heart disease including obesity. We discussed intensive lifestyle modifications today with an emphasis on specific weight loss instructions and strategies. Pt was also informed of the importance of increasing exercise and decreasing saturated fats to help prevent heart disease.  Obesity Diane Padilla is currently in the action stage of change. As such, her goal is to continue with weight loss efforts She has agreed to keep a food journal with 1300-1600 calories and 90+ grams of protein daily Diane Padilla has been instructed to work up to a goal of 150 minutes of combined cardio and strengthening exercise per week for weight loss and overall health benefits. We discussed the following Behavioral Modification Strategies today: increasing lean protein intake, decreasing simple carbohydrates  and emotional eating strategies   Diane Padilla has  agreed to follow up with our clinic in 2 weeks. She was informed of the importance of frequent follow up visits to maximize her success with intensive lifestyle modifications for her multiple health conditions.  ALLERGIES: No Known Allergies  MEDICATIONS: Current Outpatient Medications on File Prior to Visit  Medication Sig Dispense Refill  .  Cholecalciferol (VITAMIN D) 50 MCG (2000 UT) CAPS Take 1 capsule by mouth daily.    Marland Kitchen CINNAMON PO Take by mouth.    . diphenhydrAMINE (BENADRYL ALLERGY) 25 MG tablet Take 25 mg by mouth at bedtime as needed.    . diphenhydramine-acetaminophen (TYLENOL PM) 25-500 MG TABS tablet Take 1 tablet by mouth at bedtime as needed.    . medroxyPROGESTERone (PROVERA) 5 MG tablet Take for 5 days now and every month if no spontaneous cycle 15 tablet 3  . metFORMIN (GLUCOPHAGE-XR) 500 MG 24 hr tablet Take 500 mg by mouth. Take one tablet by mouth at breakfast and 2 tablets at dinner    . Multiple Vitamin (MULTIVITAMIN) tablet Take 1 tablet by mouth daily.    . vitamin B-12 (CYANOCOBALAMIN) 1000 MCG tablet Take 1,000 mcg by mouth daily.    . vitamin C (ASCORBIC ACID) 500 MG tablet Take 500 mg by mouth daily.     No current facility-administered medications on file prior to visit.     PAST MEDICAL HISTORY: Past Medical History:  Diagnosis Date  . Allergy   . Back pain   . Depression   . Diabetes mellitus without complication ([place]) 0/08/67   no meds at this time  . Dyspnea   . GERD (gastroesophageal reflux disease)   . PCOS (polycystic ovarian syndrome)   . Sleep apnea   . Vitamin D deficiency     PAST SURGICAL HISTORY: Past Surgical History:  Procedure Laterality Date  . WISDOM TOOTH EXTRACTION      SOCIAL HISTORY: Social History   Tobacco Use  . Smoking status: Never Smoker  . Smokeless tobacco: Never Used  Substance Use Topics  . Alcohol use: No  . Drug use: No    FAMILY HISTORY: Family History  Problem Relation Age of Onset  . Diabetes Mother   . Hypertension Mother   . Hyperlipidemia Mother   . Stroke Mother   . Cancer Mother   . Kidney disease Mother   . Sleep apnea Mother   . Drug abuse Mother   . Obesity Mother   . Hypertension Maternal Grandmother   . Hyperlipidemia Maternal Grandmother   . Drug abuse Father   . Breast cancer Cousin 30       chemo is done and  upcoming surgery - ?BRCA Negative    ROS: Review of Systems  Constitutional: Negative for weight loss.  Cardiovascular: Negative for chest pain.  Psychiatric/Behavioral: Positive for depression. Negative for suicidal ideas.    PHYSICAL EXAM: Blood pressure 129/79, pulse 82, temperature 98.4 F (36.9 C), temperature source Oral, height _0  (1.651 m), weight (!) 314 lb (142.4 kg), SpO2 97 %. Body mass index is 52.25 kg/m. Physical Exam Vitals signs reviewed.  Constitutional:      Appearance: Normal appearance. She is obese.  Cardiovascular:     Rate and Rhythm: Normal rate.     Pulses: Normal pulses.  Pulmonary:     Effort: Pulmonary effort is normal.     Breath sounds: Normal breath sounds.  Musculoskeletal: Normal range of motion.  Skin:    General: Skin is warm and dry.  Neurological:  Mental Status: She is alert and oriented to person, place, and time.  Psychiatric:        Mood and Affect: Mood normal.        Behavior: Behavior normal.     RECENT LABS AND TESTS: BMET    Component Value Date/Time   NA 138 01/19/2019 1219   K 4.7 01/19/2019 1219   CL 100 01/19/2019 1219   CO2 26 01/19/2019 1219   GLUCOSE 120 (H) 01/19/2019 1219   GLUCOSE 151 (H) 02/19/2017 1031   BUN 9 01/19/2019 1219   CREATININE 0.74 01/19/2019 1219   CREATININE 0.84 05/20/2012 1010   CALCIUM 9.2 01/19/2019 1219   GFRNONAA 105 01/19/2019 1219   GFRAA 121 01/19/2019 1219   Lab Results  Component Value Date   HGBA1C 6.1 (H) 01/19/2019   HGBA1C 6.4 (H) 08/26/2018   Lab Results  Component Value Date   INSULIN 57.0 (H) 01/19/2019   INSULIN 49.9 (H) 08/26/2018   CBC    Component Value Date/Time   WBC 8.6 04/01/2019 0953   WBC 9.4 07/27/2013 1718   RBC 4.66 04/01/2019 0953   RBC 4.71 07/27/2013 1718   HGB 13.2 04/01/2019 0953   HGB 13.6 07/27/2013 0945   HCT 38.7 04/01/2019 0953   PLT 463 (H) 04/01/2019 0953   MCV 83 04/01/2019 0953   MCH 28.3 04/01/2019 0953   MCH 28.9  07/27/2013 1718   MCHC 34.1 04/01/2019 0953   MCHC 34.7 07/27/2013 1718   RDW 14.0 04/01/2019 0953   LYMPHSABS 3.1 08/26/2018 0931   EOSABS 0.2 08/26/2018 0931   BASOSABS 0.0 08/26/2018 0931   Iron/TIBC/Ferritin/ %Sat    Component Value Date/Time   FERRITIN 56 04/01/2019 0953   Lipid Panel     Component Value Date/Time   CHOL 146 01/19/2019 1219   TRIG 74 01/19/2019 1219   HDL 39 (L) 01/19/2019 1219   LDLCALC 92 01/19/2019 1219   Hepatic Function Panel     Component Value Date/Time   PROT 7.3 01/19/2019 1219   ALBUMIN 4.2 01/19/2019 1219   AST 15 01/19/2019 1219   ALT 27 01/19/2019 1219   ALKPHOS 80 01/19/2019 1219   BILITOT 0.2 01/19/2019 1219      Component Value Date/Time   TSH 1.990 04/01/2019 0953   TSH 1.660 08/26/2018 0931   TSH 2.570 03/06/2017 0858      OBESITY BEHAVIORAL INTERVENTION VISIT  Today's visit was # 8   Starting weight: 321 lbs Starting date: 08/26/2018 Today's weight : 314 lbs  Today's date: 04/16/2019 Total lbs lost to date: 7    ASK: We discussed the diagnosis of obesity with Diane Padilla today and Diane Padilla agreed to give [place] permission to discuss obesity behavioral modification therapy today.  ASSESS: Diane Padilla has the diagnosis of obesity and her BMI today is 52.25 Diane Padilla is in the action stage of change   ADVISE: Diane Padilla was educated on the multiple health risks of obesity as well as the benefit of weight loss to improve her health. She was advised of the need for long term treatment and the importance of lifestyle modifications to improve her current health and to decrease her risk of future health problems.  AGREE: Multiple dietary modification options and treatment options were discussed and  Diane Padilla agreed to follow the recommendations documented in the above note.  ARRANGE: Diane Padilla was educated on the importance of frequent visits to treat obesity as outlined per CMS and USPSTF guidelines and agreed to schedule  her  next follow up appointment today.  I, Trixie Dredge, am acting as transcriptionist for Dennard Nip, MD  I have reviewed the above documentation for accuracy and completeness, and I agree with the above. -Dennard Nip, MD

## 2019-04-20 MED ORDER — BUPROPION HCL ER (SR) 150 MG PO TB12: 150.0000 mg | ORAL_TABLET | Freq: Every day | ORAL | 0 refills | Status: DC

## 2019-05-04 ENCOUNTER — Encounter (INDEPENDENT_AMBULATORY_CARE_PROVIDER_SITE_OTHER): Payer: Self-pay | Admitting: Family Medicine

## 2019-05-04 ENCOUNTER — Other Ambulatory Visit: Payer: Self-pay

## 2019-05-04 ENCOUNTER — Ambulatory Visit (INDEPENDENT_AMBULATORY_CARE_PROVIDER_SITE_OTHER): Payer: BC Managed Care – PPO | Admitting: Family Medicine

## 2019-05-04 VITALS — BP 138/75 | HR 85 | Temp 98.0°F | Ht 65.0 in | Wt 313.0 lb

## 2019-05-04 DIAGNOSIS — F3289 Other specified depressive episodes: Secondary | ICD-10-CM

## 2019-05-04 DIAGNOSIS — Z6841 Body Mass Index (BMI) 40.0 and over, adult: Secondary | ICD-10-CM

## 2019-05-04 DIAGNOSIS — G4733 Obstructive sleep apnea (adult) (pediatric): Secondary | ICD-10-CM | POA: Diagnosis not present

## 2019-05-04 DIAGNOSIS — E119 Type 2 diabetes mellitus without complications: Secondary | ICD-10-CM

## 2019-05-05 NOTE — Progress Notes (Signed)
Office: [phone]  /  Fax: [phone]   HPI:   Chief Complaint: OBESITY Diane Padilla is here to discuss her progress with her obesity treatment plan. She is on the keep a food journal with 1300-1600 calories and 90+ grams of protein daily and is following her eating plan approximately 70 % of the time. She states she is more mindful of her steps. Diane Padilla continues to work on [employer] loss and has lost 1 lb since her last visit. She is concerned about upcoming holidays and weight gain.  Her weight is (!) 313 lb (142 kg) today and has had a weight loss of 1 pound over a period of 2 to 3 weeks since her last visit. She has lost 8 lbs since starting treatment with [place].  Diabetes II Diane Padilla has a diagnosis of diabetes type II. Diane Padilla states she is not checking her blood sugars at home. Her last A1c was 6.1, and she is tolerating metformin well. She denies hypoglycemia. She has been working on intensive lifestyle modifications including diet, exercise, and weight loss to help control her blood glucose levels.  Depression with Emotional Eating Behaviors Diane Padilla had concerns about starting Wellbutrin due to concerns from elevated blood pressure. Diane Padilla struggles with emotional eating and using food for comfort to the extent that it is negatively impacting her health. She often snacks when she is not hungry. Diane Padilla sometimes feels she is out of control and then feels guilty that she made poor food choices. She has been working on behavior modification techniques to help reduce her emotional eating and has been somewhat successful. She shows no sign of suicidal or homicidal ideations.  Depression screen [facility] 2/9 09/08/2018 08/26/2018  Decreased Interest 0 2  Down, Depressed, Hopeless 1 3  PHQ - 2 Score 1 5  Altered sleeping 2 3  Tired, decreased energy 2 3  Change in appetite 1 3  Feeling bad or failure about yourself  0 2  Trouble concentrating 0 1  Moving slowly or fidgety/restless 0 1   Suicidal thoughts 0 1  PHQ-9 Score 6 19  Difficult doing work/chores - Somewhat difficult    ASSESSMENT AND PLAN:  Type 2 diabetes mellitus without complication, without long-term current use of insulin (H[facility])  Other depression - with emotional eating  Class 3 severe obesity with serious comorbidity and body mass index (BMI) of 50.0 to 59.9 in adult, unspecified obesity type ([place])  PLAN:  Diabetes II Diane Padilla has been given extensive diabetes education by myself today including ideal fasting and post-prandial blood glucose readings, individual ideal Hgb A1c goals and hypoglycemia prevention. We discussed the importance of good blood sugar control to decrease the likelihood of diabetic complications such as nephropathy, neuropathy, limb loss, blindness, coronary artery disease, and death. We discussed the importance of intensive lifestyle modification including diet, exercise and weight loss as the first line treatment for diabetes. Diane Padilla agrees to continue her diabetes medications, diet, and exercise, and we will recheck labs in 6 weeks. Diane Padilla agrees to follow up with our clinic in 3 weeks.  Depression with Emotional Eating Behaviors We discussed behavior modification techniques today to help Diane Padilla deal with her emotional eating and depression. [place]'s blood pressure is stable today, and we discussed the pro's and con's of Wellbutrin. She will reconsider and follow up.  I spent > than 50% of the 25 minute visit on counseling as documented in the note.  Obesity Diane Padilla is currently in the action stage of change. As such, her goal is  to continue with weight loss efforts She has agreed to keep a food journal with 1300-1600 calories and 90+ grams of protein daily Diane Padilla has been instructed to work up to a goal of 150 minutes of combined cardio and strengthening exercise per week for weight loss and overall health benefits. We discussed the following Behavioral Modification  Strategies today: holiday eating strategies    Diane Padilla has agreed to follow up with our clinic in 3 weeks. She was informed of the importance of frequent follow up visits to maximize her success with intensive lifestyle modifications for her multiple health conditions.  ALLERGIES: No Known Allergies  MEDICATIONS: Current Outpatient Medications on File Prior to Visit  Medication Sig Dispense Refill  . buPROPion (WELLBUTRIN SR) 150 MG 12 hr tablet Take 1 tablet (150 mg total) by mouth daily. 30 tablet 0  . Cholecalciferol (VITAMIN D) 50 MCG (2000 UT) CAPS Take 1 capsule by mouth daily.    Marland Kitchen CINNAMON PO Take by mouth.    . diphenhydrAMINE (BENADRYL ALLERGY) 25 MG tablet Take 25 mg by mouth at bedtime as needed.    . diphenhydramine-acetaminophen (TYLENOL PM) 25-500 MG TABS tablet Take 1 tablet by mouth at bedtime as needed.    . medroxyPROGESTERone (PROVERA) 5 MG tablet Take for 5 days now and every month if no spontaneous cycle 15 tablet 3  . metFORMIN (GLUCOPHAGE-XR) 500 MG 24 hr tablet Take 500 mg by mouth. Take one tablet by mouth at breakfast and 2 tablets at dinner    . Multiple Vitamin (MULTIVITAMIN) tablet Take 1 tablet by mouth daily.    . vitamin B-12 (CYANOCOBALAMIN) 1000 MCG tablet Take 1,000 mcg by mouth daily.    . vitamin C (ASCORBIC ACID) 500 MG tablet Take 500 mg by mouth daily.     No current facility-administered medications on file prior to visit.     PAST MEDICAL HISTORY: Past Medical History:  Diagnosis Date  . Allergy   . Back pain   . Depression   . Diabetes mellitus without complication ([place]) 5/73/22   no meds at this time  . Dyspnea   . GERD (gastroesophageal reflux disease)   . PCOS (polycystic ovarian syndrome)   . Sleep apnea   . Vitamin D deficiency     PAST SURGICAL HISTORY: Past Surgical History:  Procedure Laterality Date  . WISDOM TOOTH EXTRACTION      SOCIAL HISTORY: Social History   Tobacco Use  . Smoking status: Never Smoker  .  Smokeless tobacco: Never Used  Substance Use Topics  . Alcohol use: No  . Drug use: No    FAMILY HISTORY: Family History  Problem Relation Age of Onset  . Diabetes Mother   . Hypertension Mother   . Hyperlipidemia Mother   . Stroke Mother   . Cancer Mother   . Kidney disease Mother   . Sleep apnea Mother   . Drug abuse Mother   . Obesity Mother   . Hypertension Maternal Grandmother   . Hyperlipidemia Maternal Grandmother   . Drug abuse Father   . Breast cancer Cousin 30       chemo is done and upcoming surgery - ?BRCA Negative    ROS: Review of Systems  Constitutional: Positive for weight loss.  Endo/Heme/Allergies:       Negative hypoglycemia  Psychiatric/Behavioral: Positive for depression. Negative for suicidal ideas.    PHYSICAL EXAM: Blood pressure 138/75, pulse 85, temperature 98 F (36.7 C), temperature source Oral, height _0  (1.651  m), weight (!) 313 lb (142 kg), last menstrual period 04/28/2019, SpO2 97 %. Body mass index is 52.09 kg/m. Physical Exam Vitals signs reviewed.  Constitutional:      Appearance: Normal appearance. She is obese.  Cardiovascular:     Rate and Rhythm: Normal rate.     Pulses: Normal pulses.  Pulmonary:     Effort: Pulmonary effort is normal.     Breath sounds: Normal breath sounds.  Musculoskeletal: Normal range of motion.  Skin:    General: Skin is warm and dry.  Neurological:     Mental Status: She is alert and oriented to person, place, and time.  Psychiatric:        Mood and Affect: Mood normal.        Behavior: Behavior normal.     RECENT LABS AND TESTS: BMET    Component Value Date/Time   NA 138 01/19/2019 1219   K 4.7 01/19/2019 1219   CL 100 01/19/2019 1219   CO2 26 01/19/2019 1219   GLUCOSE 120 (H) 01/19/2019 1219   GLUCOSE 151 (H) 02/19/2017 1031   BUN 9 01/19/2019 1219   CREATININE 0.74 01/19/2019 1219   CREATININE 0.84 05/20/2012 1010   CALCIUM 9.2 01/19/2019 1219   GFRNONAA 105 01/19/2019 1219    GFRAA 121 01/19/2019 1219   Lab Results  Component Value Date   HGBA1C 6.1 (H) 01/19/2019   HGBA1C 6.4 (H) 08/26/2018   Lab Results  Component Value Date   INSULIN 57.0 (H) 01/19/2019   INSULIN 49.9 (H) 08/26/2018   CBC    Component Value Date/Time   WBC 8.6 04/01/2019 0953   WBC 9.4 07/27/2013 1718   RBC 4.66 04/01/2019 0953   RBC 4.71 07/27/2013 1718   HGB 13.2 04/01/2019 0953   HGB 13.6 07/27/2013 0945   HCT 38.7 04/01/2019 0953   PLT 463 (H) 04/01/2019 0953   MCV 83 04/01/2019 0953   MCH 28.3 04/01/2019 0953   MCH 28.9 07/27/2013 1718   MCHC 34.1 04/01/2019 0953   MCHC 34.7 07/27/2013 1718   RDW 14.0 04/01/2019 0953   LYMPHSABS 3.1 08/26/2018 0931   EOSABS 0.2 08/26/2018 0931   BASOSABS 0.0 08/26/2018 0931   Iron/TIBC/Ferritin/ %Sat    Component Value Date/Time   FERRITIN 56 04/01/2019 0953   Lipid Panel     Component Value Date/Time   CHOL 146 01/19/2019 1219   TRIG 74 01/19/2019 1219   HDL 39 (L) 01/19/2019 1219   LDLCALC 92 01/19/2019 1219   Hepatic Function Panel     Component Value Date/Time   PROT 7.3 01/19/2019 1219   ALBUMIN 4.2 01/19/2019 1219   AST 15 01/19/2019 1219   ALT 27 01/19/2019 1219   ALKPHOS 80 01/19/2019 1219   BILITOT 0.2 01/19/2019 1219      Component Value Date/Time   TSH 1.990 04/01/2019 0953   TSH 1.660 08/26/2018 0931   TSH 2.570 03/06/2017 0858      OBESITY BEHAVIORAL INTERVENTION VISIT  Today's visit was # 9   Starting weight: 321 lbs Starting date: 08/26/2018 Today's weight : 313 lbs Today's date: 05/04/2019 Total lbs lost to date: 8    ASK: We discussed the diagnosis of obesity with Diane Padilla today and Diane Padilla agreed to give [place] permission to discuss obesity behavioral modification therapy today.  ASSESS: Diane Padilla has the diagnosis of obesity and her BMI today is 52.09 Diane Padilla is in the action stage of change   ADVISE: Diane Padilla was educated on the  multiple health risks of obesity as well as  the benefit of weight loss to improve her health. She was advised of the need for long term treatment and the importance of lifestyle modifications to improve her current health and to decrease her risk of future health problems.  AGREE: Multiple dietary modification options and treatment options were discussed and  Diane Padilla agreed to follow the recommendations documented in the above note.  ARRANGE: Diane Padilla was educated on the importance of frequent visits to treat obesity as outlined per CMS and USPSTF guidelines and agreed to schedule her next follow up appointment today.  I, Trixie Dredge, am acting as transcriptionist for Dennard Nip, MD I have reviewed the above documentation for accuracy and completeness, and I agree with the above. -Dennard Nip, MD

## 2019-05-06 DIAGNOSIS — G4733 Obstructive sleep apnea (adult) (pediatric): Secondary | ICD-10-CM | POA: Diagnosis not present

## 2019-05-28 ENCOUNTER — Ambulatory Visit (INDEPENDENT_AMBULATORY_CARE_PROVIDER_SITE_OTHER): Payer: BC Managed Care – PPO | Admitting: Family Medicine

## 2019-06-02 ENCOUNTER — Ambulatory Visit (INDEPENDENT_AMBULATORY_CARE_PROVIDER_SITE_OTHER): Payer: BC Managed Care – PPO | Admitting: Family Medicine

## 2019-06-03 DIAGNOSIS — G4733 Obstructive sleep apnea (adult) (pediatric): Secondary | ICD-10-CM | POA: Diagnosis not present

## 2019-07-04 DIAGNOSIS — G4733 Obstructive sleep apnea (adult) (pediatric): Secondary | ICD-10-CM | POA: Diagnosis not present

## 2019-07-06 DIAGNOSIS — F411 Generalized anxiety disorder: Secondary | ICD-10-CM | POA: Diagnosis not present

## 2019-07-06 DIAGNOSIS — F320 Major depressive disorder, single episode, mild: Secondary | ICD-10-CM | POA: Diagnosis not present

## 2019-07-13 DIAGNOSIS — F320 Major depressive disorder, single episode, mild: Secondary | ICD-10-CM | POA: Diagnosis not present

## 2019-07-13 DIAGNOSIS — F411 Generalized anxiety disorder: Secondary | ICD-10-CM | POA: Diagnosis not present

## 2019-07-15 ENCOUNTER — Ambulatory Visit: Payer: Self-pay | Admitting: Obstetrics and Gynecology

## 2019-07-23 DIAGNOSIS — F411 Generalized anxiety disorder: Secondary | ICD-10-CM | POA: Diagnosis not present

## 2019-07-23 DIAGNOSIS — F320 Major depressive disorder, single episode, mild: Secondary | ICD-10-CM | POA: Diagnosis not present

## 2019-07-30 DIAGNOSIS — F411 Generalized anxiety disorder: Secondary | ICD-10-CM | POA: Diagnosis not present

## 2019-07-30 DIAGNOSIS — F320 Major depressive disorder, single episode, mild: Secondary | ICD-10-CM | POA: Diagnosis not present

## 2019-08-04 DIAGNOSIS — G4733 Obstructive sleep apnea (adult) (pediatric): Secondary | ICD-10-CM | POA: Diagnosis not present

## 2019-08-06 DIAGNOSIS — F411 Generalized anxiety disorder: Secondary | ICD-10-CM | POA: Diagnosis not present

## 2019-08-06 DIAGNOSIS — F320 Major depressive disorder, single episode, mild: Secondary | ICD-10-CM | POA: Diagnosis not present

## 2019-08-20 DIAGNOSIS — F411 Generalized anxiety disorder: Secondary | ICD-10-CM | POA: Diagnosis not present

## 2019-08-20 DIAGNOSIS — F320 Major depressive disorder, single episode, mild: Secondary | ICD-10-CM | POA: Diagnosis not present

## 2019-08-27 DIAGNOSIS — F411 Generalized anxiety disorder: Secondary | ICD-10-CM | POA: Diagnosis not present

## 2019-08-27 DIAGNOSIS — F320 Major depressive disorder, single episode, mild: Secondary | ICD-10-CM | POA: Diagnosis not present

## 2019-09-03 DIAGNOSIS — F411 Generalized anxiety disorder: Secondary | ICD-10-CM | POA: Diagnosis not present

## 2019-09-03 DIAGNOSIS — F320 Major depressive disorder, single episode, mild: Secondary | ICD-10-CM | POA: Diagnosis not present

## 2019-09-10 DIAGNOSIS — G4733 Obstructive sleep apnea (adult) (pediatric): Secondary | ICD-10-CM | POA: Diagnosis not present

## 2019-09-11 ENCOUNTER — Encounter: Payer: Self-pay | Admitting: Certified Nurse Midwife

## 2019-09-17 DIAGNOSIS — F411 Generalized anxiety disorder: Secondary | ICD-10-CM | POA: Diagnosis not present

## 2019-09-17 DIAGNOSIS — F320 Major depressive disorder, single episode, mild: Secondary | ICD-10-CM | POA: Diagnosis not present

## 2019-09-25 DIAGNOSIS — F320 Major depressive disorder, single episode, mild: Secondary | ICD-10-CM | POA: Diagnosis not present

## 2019-09-25 DIAGNOSIS — F411 Generalized anxiety disorder: Secondary | ICD-10-CM | POA: Diagnosis not present

## 2019-10-01 DIAGNOSIS — F320 Major depressive disorder, single episode, mild: Secondary | ICD-10-CM | POA: Diagnosis not present

## 2019-10-01 DIAGNOSIS — F411 Generalized anxiety disorder: Secondary | ICD-10-CM | POA: Diagnosis not present

## 2019-10-08 ENCOUNTER — Ambulatory Visit: Payer: Self-pay | Admitting: Obstetrics and Gynecology

## 2019-10-09 DIAGNOSIS — F411 Generalized anxiety disorder: Secondary | ICD-10-CM | POA: Diagnosis not present

## 2019-10-09 DIAGNOSIS — F320 Major depressive disorder, single episode, mild: Secondary | ICD-10-CM | POA: Diagnosis not present

## 2019-10-15 DIAGNOSIS — F411 Generalized anxiety disorder: Secondary | ICD-10-CM | POA: Diagnosis not present

## 2019-10-15 DIAGNOSIS — F320 Major depressive disorder, single episode, mild: Secondary | ICD-10-CM | POA: Diagnosis not present

## 2019-10-29 DIAGNOSIS — F411 Generalized anxiety disorder: Secondary | ICD-10-CM | POA: Diagnosis not present

## 2019-10-29 DIAGNOSIS — F320 Major depressive disorder, single episode, mild: Secondary | ICD-10-CM | POA: Diagnosis not present

## 2019-11-02 DIAGNOSIS — G4733 Obstructive sleep apnea (adult) (pediatric): Secondary | ICD-10-CM | POA: Diagnosis not present

## 2019-11-05 DIAGNOSIS — F411 Generalized anxiety disorder: Secondary | ICD-10-CM | POA: Diagnosis not present

## 2019-11-05 DIAGNOSIS — F320 Major depressive disorder, single episode, mild: Secondary | ICD-10-CM | POA: Diagnosis not present

## 2019-11-12 DIAGNOSIS — F320 Major depressive disorder, single episode, mild: Secondary | ICD-10-CM | POA: Diagnosis not present

## 2019-11-12 DIAGNOSIS — F411 Generalized anxiety disorder: Secondary | ICD-10-CM | POA: Diagnosis not present

## 2019-11-26 DIAGNOSIS — F320 Major depressive disorder, single episode, mild: Secondary | ICD-10-CM | POA: Diagnosis not present

## 2019-11-26 DIAGNOSIS — F411 Generalized anxiety disorder: Secondary | ICD-10-CM | POA: Diagnosis not present

## 2019-12-03 DIAGNOSIS — F411 Generalized anxiety disorder: Secondary | ICD-10-CM | POA: Diagnosis not present

## 2019-12-03 DIAGNOSIS — F320 Major depressive disorder, single episode, mild: Secondary | ICD-10-CM | POA: Diagnosis not present

## 2019-12-10 DIAGNOSIS — F320 Major depressive disorder, single episode, mild: Secondary | ICD-10-CM | POA: Diagnosis not present

## 2019-12-10 DIAGNOSIS — F411 Generalized anxiety disorder: Secondary | ICD-10-CM | POA: Diagnosis not present

## 2019-12-14 DIAGNOSIS — Z130 Encounter for screening for diseases of the blood and blood-forming organs and certain disorders involving the immune mechanism: Secondary | ICD-10-CM | POA: Diagnosis not present

## 2019-12-14 DIAGNOSIS — E559 Vitamin D deficiency, unspecified: Secondary | ICD-10-CM | POA: Diagnosis not present

## 2019-12-14 DIAGNOSIS — G4730 Sleep apnea, unspecified: Secondary | ICD-10-CM | POA: Insufficient documentation

## 2019-12-14 DIAGNOSIS — E1165 Type 2 diabetes mellitus with hyperglycemia: Secondary | ICD-10-CM | POA: Diagnosis not present

## 2019-12-14 DIAGNOSIS — Z1329 Encounter for screening for other suspected endocrine disorder: Secondary | ICD-10-CM | POA: Diagnosis not present

## 2019-12-18 DIAGNOSIS — F411 Generalized anxiety disorder: Secondary | ICD-10-CM | POA: Diagnosis not present

## 2019-12-18 DIAGNOSIS — F320 Major depressive disorder, single episode, mild: Secondary | ICD-10-CM | POA: Diagnosis not present

## 2019-12-22 DIAGNOSIS — F411 Generalized anxiety disorder: Secondary | ICD-10-CM | POA: Diagnosis not present

## 2019-12-22 DIAGNOSIS — F320 Major depressive disorder, single episode, mild: Secondary | ICD-10-CM | POA: Diagnosis not present

## 2020-01-13 ENCOUNTER — Telehealth: Payer: Self-pay | Admitting: Obstetrics and Gynecology

## 2020-01-13 NOTE — Telephone Encounter (Signed)
Patient would like to reschedule 3 month follow up on her cycle that she had to cancel.

## 2020-01-13 NOTE — Telephone Encounter (Signed)
Left message to call Noreene Larsson, RN at [facility] [phone].    Last AEX 04/01/19

## 2020-01-14 NOTE — Telephone Encounter (Signed)
Spoke with patient, request to reschedule 3 mo f/u. Patient was seen in office on 04/01/19 for AEX. Started provera 5 mg x5 days q other month if no spontaneous menses. Patient states she was to return for 3 mo f/u. Has not been taking provera as recommended, cycles are irregular. Denies any new symptoms. OV scheduled for OV on 02/09/20 at 4:30pm w/ Dr. Oscar La.   Routing to provider for final review. Patient is agreeable to disposition. Will close encounter.

## 2020-01-26 DIAGNOSIS — R7989 Other specified abnormal findings of blood chemistry: Secondary | ICD-10-CM | POA: Diagnosis not present

## 2020-01-26 DIAGNOSIS — G4733 Obstructive sleep apnea (adult) (pediatric): Secondary | ICD-10-CM | POA: Diagnosis not present

## 2020-01-26 DIAGNOSIS — G4730 Sleep apnea, unspecified: Secondary | ICD-10-CM | POA: Diagnosis not present

## 2020-01-26 DIAGNOSIS — Z0000 Encounter for general adult medical examination without abnormal findings: Secondary | ICD-10-CM | POA: Diagnosis not present

## 2020-01-26 DIAGNOSIS — G4700 Insomnia, unspecified: Secondary | ICD-10-CM | POA: Diagnosis not present

## 2020-01-26 DIAGNOSIS — Z9989 Dependence on other enabling machines and devices: Secondary | ICD-10-CM | POA: Diagnosis not present

## 2020-01-26 DIAGNOSIS — E1165 Type 2 diabetes mellitus with hyperglycemia: Secondary | ICD-10-CM | POA: Diagnosis not present

## 2020-01-26 DIAGNOSIS — F411 Generalized anxiety disorder: Secondary | ICD-10-CM | POA: Diagnosis not present

## 2020-01-26 DIAGNOSIS — F320 Major depressive disorder, single episode, mild: Secondary | ICD-10-CM | POA: Diagnosis not present

## 2020-02-01 DIAGNOSIS — G4733 Obstructive sleep apnea (adult) (pediatric): Secondary | ICD-10-CM | POA: Diagnosis not present

## 2020-02-09 ENCOUNTER — Encounter: Payer: Self-pay | Admitting: Obstetrics and Gynecology

## 2020-02-09 ENCOUNTER — Other Ambulatory Visit: Payer: Self-pay

## 2020-02-09 ENCOUNTER — Ambulatory Visit (INDEPENDENT_AMBULATORY_CARE_PROVIDER_SITE_OTHER): Payer: BC Managed Care – PPO | Admitting: Obstetrics and Gynecology

## 2020-02-09 VITALS — BP 124/82 | HR 68 | Temp 98.4°F | Wt 320.8 lb

## 2020-02-09 DIAGNOSIS — N939 Abnormal uterine and vaginal bleeding, unspecified: Secondary | ICD-10-CM

## 2020-02-09 DIAGNOSIS — Z6841 Body Mass Index (BMI) 40.0 and over, adult: Secondary | ICD-10-CM | POA: Diagnosis not present

## 2020-02-09 DIAGNOSIS — F320 Major depressive disorder, single episode, mild: Secondary | ICD-10-CM | POA: Diagnosis not present

## 2020-02-09 DIAGNOSIS — N914 Secondary oligomenorrhea: Secondary | ICD-10-CM | POA: Diagnosis not present

## 2020-02-09 DIAGNOSIS — F411 Generalized anxiety disorder: Secondary | ICD-10-CM | POA: Diagnosis not present

## 2020-02-09 LAB — POCT URINE PREGNANCY: Preg Test, Ur: NEGATIVE

## 2020-02-09 MED ORDER — MEDROXYPROGESTERONE ACETATE 5 MG PO TABS: ORAL_TABLET | ORAL | 0 refills | Status: DC

## 2020-02-09 NOTE — Progress Notes (Signed)
GYNECOLOGY  VISIT   HPI: 34 y.o.   Married Black or [place] American Not Hispanic or Latino  female   G0P0000 with Patient's last menstrual period was 12/10/2019 (exact date).   here for follow up with irregular menses. 12/10/2019 cycle started. On July 2nd patient started Provera. Took 5 doses which stopped menses July 10th and she has not had a menses since.  She has a h/o oligomenorrhea with prior negative w/u. For a while she was taking provera monthly to bring on her cycle. It was recommended at her annual exam last year that she take the provera monthly to avoid anovulatory bleeding (declined biopsy). She hasn't been doing that. Her cycles have been irregular. Since I saw her in 10/20 she had a cycle in 11/3-11/10 (provera cycle), she took progesterone in 12/20 for 5 days, didn't cycle after the provera in 12/20 (unless she forgot to take it). She took the provera on 07/11/19, cycle started on 07/18/19 normal cycle. She didn't take the provera in 2/21, didn't take the provera until June. Between 1/21 and 6/21 she had occasional spotting for a couple of days, no real cycles.  She is sexually active, hasn't been using contraception for a long time, not actively trying, not preventing. She would like to loose weight prior to pregnancy.   Last endometrial biopsy in 10/18 was normal.   She was going to the weight loss clinic, stopped going, has an appointment at a new clinic.   Hasn't been sexually active for over 2 weeks.   She bleed for 23 days starting on 12/10/19, she took provera x 5 days starting on 12/25/19, stopped bleeding on 7/10, no bleeding since then.   H/O DM, her HgbA1C in 6/21 was 6.2.  GYNECOLOGIC HISTORY: Patient's last menstrual period was 12/10/2019 (exact date). Contraception: Menopausal hormone therapy: N/A        OB History    Gravida  0   Para  0   Term  0   Preterm  0   AB  0   Living  0     SAB  0   TAB  0   Ectopic  0   Multiple  0   Live Births                  Patient Active Problem List   Diagnosis Date Noted  . Type 2 diabetes mellitus with hyperglycemia, without long-term current use of insulin ([place]) 12/09/2018  . Class 3 severe obesity with serious comorbidity and body mass index (BMI) of 50.0 to 59.9 in adult ([place]) 12/09/2018  . PCOS (polycystic ovarian syndrome) 01/09/2013  . Oligomenorrhea 12/12/2012    Past Medical History:  Diagnosis Date  . Allergy   . Back pain   . Depression   . Diabetes mellitus without complication ([place]) 9/70/26   no meds at this time  . Dyspnea   . GERD (gastroesophageal reflux disease)   . PCOS (polycystic ovarian syndrome)   . Sleep apnea   . Vitamin D deficiency     Past Surgical History:  Procedure Laterality Date  . WISDOM TOOTH EXTRACTION      Current Outpatient Medications  Medication Sig Dispense Refill  . Cholecalciferol (VITAMIN D) 50 MCG (2000 UT) CAPS Take 1 capsule by mouth daily.    . diphenhydramine-acetaminophen (TYLENOL PM) 25-500 MG TABS tablet Take 1 tablet by mouth at bedtime as needed.    . metFORMIN (GLUCOPHAGE-XR) 500 MG 24 hr tablet Take 500 mg  by mouth. Take one tablet by mouth at breakfast and 2 tablets at dinner    . Multiple Vitamin (MULTIVITAMIN) tablet Take 1 tablet by mouth daily.    . vitamin B-12 (CYANOCOBALAMIN) 1000 MCG tablet Take 1,000 mcg by mouth daily.    . vitamin C (ASCORBIC ACID) 500 MG tablet Take 500 mg by mouth daily.    Marland Kitchen buPROPion (WELLBUTRIN SR) 150 MG 12 hr tablet Take 1 tablet (150 mg total) by mouth daily. (Patient not taking: Reported on 02/09/2020) 30 tablet 0  . CINNAMON PO Take by mouth. (Patient not taking: Reported on 02/09/2020)    . diphenhydrAMINE (BENADRYL ALLERGY) 25 MG tablet Take 25 mg by mouth at bedtime as needed. (Patient not taking: Reported on 02/09/2020)    . ELDERBERRY PO Take 1 tablet by mouth.    . medroxyPROGESTERone (PROVERA) 5 MG tablet Take for 5 days now and every month if no spontaneous cycle (Patient  not taking: Reported on 02/09/2020) 15 tablet 3  . PRENATAL VIT-FE FUMARATE-FA PO Take 1 tablet by mouth.     No current facility-administered medications for this visit.     ALLERGIES: Patient has no known allergies.  Family History  Problem Relation Age of Onset  . Diabetes Mother   . Hypertension Mother   . Hyperlipidemia Mother   . Stroke Mother   . Cancer Mother   . Kidney disease Mother   . Sleep apnea Mother   . Drug abuse Mother   . Obesity Mother   . Hypertension Maternal Grandmother   . Hyperlipidemia Maternal Grandmother   . Drug abuse Father   . Breast cancer Cousin 30       chemo is done and upcoming surgery - ?BRCA Negative    Social History   Socioeconomic History  . Marital status: Married    Spouse name: Verl Blalock.   . Number of children: Not on file  . Years of education: Not on file  . Highest education level: Not on file  Occupational History  . Occupation: Training and development officer: [employer]   Tobacco Use  . Smoking status: Never Smoker  . Smokeless tobacco: Never Used  Vaping Use  . Vaping Use: Never used  Substance and Sexual Activity  . Alcohol use: No  . Drug use: No  . Sexual activity: Yes    Partners: Male    Birth control/protection: None  Other Topics Concern  . Not on file  Social History Narrative  . Not on file   Social Determinants of Health   Financial Resource Strain:   . Difficulty of Paying Living Expenses:   Food Insecurity:   . Worried About Charity fundraiser in the Last Year:   . Arboriculturist in the Last Year:   Transportation Needs:   . Film/video editor (Medical):   Marland Kitchen Lack of Transportation (Non-Medical):   Physical Activity:   . Days of Exercise per Week:   . Minutes of Exercise per Session:   Stress:   . Feeling of Stress :   Social Connections:   . Frequency of Communication with Friends and Family:   . Frequency of Social Gatherings with Friends and Family:   . Attends Religious  Services:   . Active Member of Clubs or Organizations:   . Attends Archivist Meetings:   Marland Kitchen Marital Status:   Intimate Partner Violence:   . Fear of Current or Ex-Partner:   . Emotionally  Abused:   Marland Kitchen Physically Abused:   . Sexually Abused:     Review of Systems  Constitutional: Negative.   HENT: Negative.   Eyes: Negative.   Respiratory: Negative.   Cardiovascular: Negative.   Gastrointestinal: Negative.   Genitourinary: Positive for frequency.       Irregular menses  Musculoskeletal: Negative.   Skin: Negative.   Neurological: Negative.   Endo/Heme/Allergies: Negative.   Psychiatric/Behavioral: Negative.     PHYSICAL EXAMINATION:    BP (!) 142/88 (BP Location: Right Arm, Patient Position: Sitting, Cuff Size: Large)   Pulse 68   Temp 98.4 F (36.9 C) (Skin)   Wt (!) 320 lb 12.8 oz (145.5 kg)   LMP 12/10/2019 (Exact Date)   BMI 53.38 kg/m     General appearance: alert, cooperative and appears stated age   ASSESSMENT AUB, long h/o oligomenorrhea then anovulatory bleeding. Last endometrial biopsy was almost 3 years ago. She has always had normal thyroid testing, last TSH was in 10/20.  We discussed the risk of endometrial hyperplasia, a negative biopsy 3 years ago doesn't mean she hasn't developed a problem.     PLAN UPT negative. Recommended endometrial biopsy, she declines Will schedule an ultrasound (if too expensive she will decline) Discussed option of OCP', POP, mirena IUD and cyclic provera She would like to do the monthly provera (she needs 2 weeks of using protection, and have a negative pregnancy test prior) Provera 5 mg x 5 days monthly F/U for ultrasound Then f/u for annual exam in 10/20   An After Visit Summary was printed and given to the patient.  ~32 minutes in total patient care.

## 2020-02-09 NOTE — Patient Instructions (Signed)
Abnormal Uterine Bleeding °Abnormal uterine bleeding is unusual bleeding from the uterus. It includes: °· Bleeding or spotting between periods. °· Bleeding after sex. °· Bleeding that is heavier than normal. °· Periods that last longer than usual. °· Bleeding after menopause. °Abnormal uterine bleeding can affect women at various stages in life, including teenagers, women in their reproductive years, pregnant women, and women who have reached menopause. Common causes of abnormal uterine bleeding include: °· Pregnancy. °· Growths of tissue (polyps). °· A noncancerous tumor in the uterus (fibroid). °· Infection. °· Cancer. °· Hormonal imbalances. °Any type of abnormal bleeding should be evaluated by a health care provider. Many cases are minor and simple to treat, while others are more serious. Treatment will depend on the cause of the bleeding. °Follow these instructions at home: °· Monitor your condition for any changes. °· Do not use tampons, douche, or have sex if told by your health care provider. °· Change your pads often. °· Get regular exams that include pelvic exams and cervical cancer screening. °· Keep all follow-up visits as told by your health care provider. This is important. °Contact a health care provider if: °· Your bleeding lasts for more than one week. °· You feel dizzy at times. °· You feel nauseous or you vomit. °Get help right away if: °· You pass out. °· Your bleeding soaks through a pad every hour. °· You have abdominal pain. °· You have a fever. °· You become sweaty or weak. °· You pass large blood clots from your vagina. °Summary °· Abnormal uterine bleeding is unusual bleeding from the uterus. °· Any type of abnormal bleeding should be evaluated by a health care provider. Many cases are minor and simple to treat, while others are more serious. °· Treatment will depend on the cause of the bleeding. °This information is not intended to replace advice given to you by your health care provider.  Make sure you discuss any questions you have with your health care provider. °Document Revised: 09/18/2017 Document Reviewed: 07/13/2016 °Elsevier Patient Education © 2020 Else[employer] Inc. ° °

## 2020-02-10 ENCOUNTER — Telehealth: Payer: Self-pay | Admitting: Obstetrics and Gynecology

## 2020-02-10 NOTE — Telephone Encounter (Signed)
Call to patient. Per DPR, OK to leave message on voicemail.   Left voicemail requesting a return call to [facility] to review benefits and schedule recommended Pelvic ultrasound with Jill Jertson, MD 

## 2020-02-15 NOTE — Telephone Encounter (Signed)
Call to patient. Per DPR, OK to leave message on voicemail.   Left voicemail requesting a return call to [facility] to review benefits and schedule recommended Pelvic ultrasound with Jill Jertson, MD 

## 2020-02-16 DIAGNOSIS — L700 Acne vulgaris: Secondary | ICD-10-CM | POA: Diagnosis not present

## 2020-02-16 DIAGNOSIS — L218 Other seborrheic dermatitis: Secondary | ICD-10-CM | POA: Diagnosis not present

## 2020-02-16 DIAGNOSIS — K130 Diseases of lips: Secondary | ICD-10-CM | POA: Diagnosis not present

## 2020-02-23 DIAGNOSIS — F411 Generalized anxiety disorder: Secondary | ICD-10-CM | POA: Diagnosis not present

## 2020-02-23 DIAGNOSIS — F320 Major depressive disorder, single episode, mild: Secondary | ICD-10-CM | POA: Diagnosis not present

## 2020-03-08 DIAGNOSIS — F411 Generalized anxiety disorder: Secondary | ICD-10-CM | POA: Diagnosis not present

## 2020-03-08 DIAGNOSIS — F320 Major depressive disorder, single episode, mild: Secondary | ICD-10-CM | POA: Diagnosis not present

## 2020-03-09 NOTE — Telephone Encounter (Signed)
Call to patient. Per DPR, OK to leave message on voicemail.  Left voicemail requesting a return call to [facility] to review benefits and schedule recommendedPelvic Diane Adie, MD.  Dr. Oscar La, please advise as patient has not been able to be reached regarding scheduling ultrasound appointment.

## 2020-03-10 NOTE — Telephone Encounter (Signed)
She is aware of the recommendation. Please send a letter with the recommendation to call us to schedule the ultrasound and close the encounter.

## 2020-03-14 DIAGNOSIS — E1165 Type 2 diabetes mellitus with hyperglycemia: Secondary | ICD-10-CM | POA: Diagnosis not present

## 2020-03-14 DIAGNOSIS — Z6841 Body Mass Index (BMI) 40.0 and over, adult: Secondary | ICD-10-CM | POA: Diagnosis not present

## 2020-03-14 DIAGNOSIS — Z13228 Encounter for screening for other metabolic disorders: Secondary | ICD-10-CM | POA: Diagnosis not present

## 2020-03-14 NOTE — Telephone Encounter (Signed)
Letter pended for Dr Oscar La to review and sign.

## 2020-03-15 NOTE — Telephone Encounter (Signed)
Letter reviewed, signed per Dr Oscar La. Will send letter today through mychart and mail to address on file.  Encounter closed.

## 2020-03-16 DIAGNOSIS — E1165 Type 2 diabetes mellitus with hyperglycemia: Secondary | ICD-10-CM | POA: Diagnosis not present

## 2020-03-16 DIAGNOSIS — Z713 Dietary counseling and surveillance: Secondary | ICD-10-CM | POA: Diagnosis not present

## 2020-03-16 DIAGNOSIS — Z6841 Body Mass Index (BMI) 40.0 and over, adult: Secondary | ICD-10-CM | POA: Diagnosis not present

## 2020-03-21 DIAGNOSIS — F320 Major depressive disorder, single episode, mild: Secondary | ICD-10-CM | POA: Diagnosis not present

## 2020-03-21 DIAGNOSIS — F411 Generalized anxiety disorder: Secondary | ICD-10-CM | POA: Diagnosis not present

## 2020-04-05 DIAGNOSIS — F320 Major depressive disorder, single episode, mild: Secondary | ICD-10-CM | POA: Diagnosis not present

## 2020-04-05 DIAGNOSIS — F411 Generalized anxiety disorder: Secondary | ICD-10-CM | POA: Diagnosis not present

## 2020-04-05 NOTE — Progress Notes (Signed)
34 y.o. G0P0000 Married Black or [place] American Not Hispanic or Latino female here for annual exam.   Period Pattern: (!) Irregular Menstrual Flow: Light, Moderate, Heavy Menstrual Control: Maxi pad Menstrual Control Change Freq (Hours): 2-3 Dysmenorrhea: (!) Moderate (lower back pain) Dysmenorrhea Symptoms:  (Lower back pain)   She has a long h/o oligomenorrhea then anovulatory bleeding. Last endometrial biopsy was 3 years ago. She has always had normal thyroid screening.  She was last seen in 8/21, declined endometrial biopsy, declined ultrasound. We discussed options for treatment and she wanted to do monthly provera. Aware she has to abstain for 2 weeks and have a negative pregnancy test prior to taking the provera.   She had a spontaneous cycles for 7 days at the end of August. No cycle since then. She is going to start it again this week.   They aren't trying to get pregnant, but it's okay if it happens. No dyspareunia.   Recent lab work with her primary. Normal TSH, HgbA1C of 6.2, abnormal lipids.   Patient's last menstrual period was 02/17/2020.          Sexually active: Yes.    The current method of family planning is none.    Exercising: No.  The patient does not participate in regular exercise at present. Smoker:  no  Health Maintenance: Pap: 03/27/2018 WNL NEG HPV, 11/16/2014 normal with negative HPV History of abnormal Pap:  no MMG:  03/15/2017 bilateral diagnostic mammogram with right breast ultrasound birads 1 negative BMD:   na Colonoscopy: NA TDaP:  10/21/09 Gardasil: no   reports that she has never smoked. She has never used smokeless tobacco. She reports that she does not drink alcohol and does not use drugs. 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     Cholecalciferol (VITAMIN D) 50 MCG (2000 UT) CAPS Take 1 capsule by mouth daily.     diphenhydrAMINE (BENADRYL ALLERGY) 25 MG tablet Take 25 mg by mouth at bedtime as needed.      diphenhydramine-acetaminophen (TYLENOL PM) 25-500 MG TABS tablet Take 1 tablet by mouth at bedtime as needed.     ELDERBERRY PO Take 1 tablet by mouth.     glucose blood test strip Use as directed. Pharmacy, please dispense this brand of blood glucose test strips: OneTouch Verio     hydrocortisone 2.5 % ointment Apply topically 2 (two) times daily as needed.     ketoconazole (NIZORAL) 2 % cream Apply topically 2 (two) times daily as needed.     medroxyPROGESTERone (PROVERA) 5 MG tablet Take for 5 days now and every month if no spontaneous cycle 15 tablet 0   metFORMIN (GLUCOPHAGE-XR) 500 MG 24 hr tablet Take 500 mg by mouth. Take one tablet by mouth at breakfast and 2 tablets at dinner     PRENATAL VIT-FE FUMARATE-FA PO Take 1 tablet by  mouth.     vitamin B-12 (CYANOCOBALAMIN) 1000 MCG tablet Take 1,000 mcg by mouth daily.     No current facility-administered medications for this visit.    Family History  Problem Relation Age of Onset   Diabetes Mother    Hypertension Mother    Hyperlipidemia Mother    Stroke Mother    Cancer Mother    Kidney disease Mother    Sleep apnea Mother    Drug abuse Mother    Obesity Mother    Hypertension Maternal Grandmother    Hyperlipidemia Maternal Grandmother    Drug abuse Father    Breast cancer Cousin 70       chemo is done and upcoming surgery - ?BRCA Negative    Review of Systems  All other systems reviewed and are  negative.   Exam:   BP 130/68    Pulse (!) 110    Ht 5' 5.25" (1.657 m)    Wt (!) 322 lb 6.4 oz (146.2 kg)    LMP 02/17/2020    SpO2 98%    BMI 53.24 kg/m   Weight change: @WEIGHTCHANGE @ Height:   Height: 5' 5.25" (165.7 cm)  Ht Readings from Last 3 Encounters:  04/06/20 5' 5.25" (1.657 m)  05/04/19 5' 5"  (1.651 m)  04/16/19 5' 5"  (1.651 m)    General appearance: alert, cooperative and appears stated age Head: Normocephalic, without obvious abnormality, atraumatic Neck: no adenopathy, supple, symmetrical, trachea midline and thyroid normal to inspection and palpation Lungs: clear to auscultation bilaterally Cardiovascular: regular rate and rhythm Breasts: normal appearance, no masses or tenderness Abdomen: soft, non-tender; non distended,  no masses,  no organomegaly Extremities: extremities normal, atraumatic, no cyanosis or edema Skin: Skin color, texture, turgor normal. No rashes or lesions Lymph nodes: Cervical, supraclavicular, and axillary nodes normal. No abnormal inguinal nodes palpated Neurologic: Grossly normal   Pelvic: External genitalia:  no lesions              Urethra:  normal appearing urethra with no masses, tenderness or lesions              Bartholins and Skenes: normal                 Vagina: normal appearing vagina with normal color and discharge, no lesions              Cervix: no lesions               Bimanual Exam:  Uterus:  no masses or tenderness              Adnexa: no mass, fullness, tenderness               Rectovaginal: Confirms               Anus:  normal sphincter tone, no lesions  Gae Dry chaperoned for the exam.  A:  Well Woman with normal exam  Long h/o oligomenorrhea and then anovulatory bleeding. Taking cyclic provera (knows to abstain and have a negative pregnancy test first)  P:   No pap this   Labs with primary  She has provera to take cyclically  Discussed breast self exam  Discussed calcium and vit D intake

## 2020-04-06 ENCOUNTER — Encounter: Payer: Self-pay | Admitting: Obstetrics and Gynecology

## 2020-04-06 ENCOUNTER — Ambulatory Visit (INDEPENDENT_AMBULATORY_CARE_PROVIDER_SITE_OTHER): Payer: BC Managed Care – PPO | Admitting: Obstetrics and Gynecology

## 2020-04-06 ENCOUNTER — Other Ambulatory Visit: Payer: Self-pay

## 2020-04-06 VITALS — BP 130/68 | HR 110 | Ht 65.25 in | Wt 322.4 lb

## 2020-04-06 DIAGNOSIS — Z6841 Body Mass Index (BMI) 40.0 and over, adult: Secondary | ICD-10-CM | POA: Diagnosis not present

## 2020-04-06 DIAGNOSIS — N914 Secondary oligomenorrhea: Secondary | ICD-10-CM | POA: Diagnosis not present

## 2020-04-06 DIAGNOSIS — Z01419 Encounter for gynecological examination (general) (routine) without abnormal findings: Secondary | ICD-10-CM

## 2020-04-06 NOTE — Patient Instructions (Signed)
EXERCISE AND DIET:  We recommended that you start or continue a regular exercise program for good health. Regular exercise means any activity that makes your heart beat faster and makes you sweat.  We recommend exercising at least 30 minutes per day at least 3 days a week, preferably 4 or 5.  We also recommend a diet low in fat and sugar.  Inactivity, poor dietary choices and obesity can cause diabetes, heart attack, stroke, and kidney damage, among others.    ALCOHOL AND SMOKING:  Women should limit their alcohol intake to no more than 7 drinks/beers/glasses of wine (combined, not each!) per week. Moderation of alcohol intake to this level decreases your risk of breast cancer and liver damage. And of course, no recreational drugs are part of a healthy lifestyle.  And absolutely no smoking or even second hand smoke. Most people know smoking can cause heart and lung diseases, but did you know it also contributes to weakening of your bones? Aging of your skin?  Yellowing of your teeth and nails?  CALCIUM AND VITAMIN D:  Adequate intake of calcium and Vitamin D are recommended.  The recommendations for exact amounts of these supplements seem to change often, but generally speaking 1,000 mg of calcium (between diet and supplement) and 800 units of Vitamin D per day seems prudent. Certain women may benefit from higher intake of Vitamin D.  If you are among these women, your doctor will have told you during your visit.    PAP SMEARS:  Pap smears, to check for cervical cancer or precancers,  have traditionally been done yearly, although recent scientific advances have shown that most women can have pap smears less often.  However, every woman still should have a physical exam from her gynecologist every year. It will include a breast check, inspection of the vulva and vagina to check for abnormal growths or skin changes, a visual exam of the cervix, and then an exam to evaluate the size and shape of the uterus and  ovaries.  And after 36 years of age, a rectal exam is indicated to check for rectal cancers. We will also provide age appropriate advice regarding health maintenance, like when you should have certain vaccines, screening for sexually transmitted diseases, bone density testing, colonoscopy, mammograms, etc.   MAMMOGRAMS:  All women over 40 years old should have a yearly mammogram. Many facilities now offer a "3D" mammogram, which may cost around $50 extra out of pocket. If possible,  we recommend you accept the option to have the 3D mammogram performed.  It both reduces the number of women who will be called back for extra views which then turn out to be normal, and it is better than the routine mammogram at detecting truly abnormal areas.    COLON CANCER SCREENING: Now recommend starting at age 45. At this time colonoscopy is not covered for routine screening until 50. There are take home tests that can be done between 45-49.   COLONOSCOPY:  Colonoscopy to screen for colon cancer is recommended for all women at age 50.  We know, you hate the idea of the prep.  We agree, BUT, having colon cancer and not knowing it is worse!!  Colon cancer so often starts as a polyp that can be seen and removed at colonscopy, which can quite literally save your life!  And if your first colonoscopy is normal and you have no family history of colon cancer, most women don't have to have it again for   10 years.  Once every ten years, you can do something that may end up saving your life, right?  We will be happy to help you get it scheduled when you are ready.  Be sure to check your insurance coverage so you understand how much it will cost.  It may be covered as a preventative service at no cost, but you should check your particular policy.      Breast Self-Awareness Breast self-awareness means being familiar with how your breasts look and feel. It involves checking your breasts regularly and reporting any changes to your  health care provider. Practicing breast self-awareness is important. A change in your breasts can be a sign of a serious medical problem. Being familiar with how your breasts look and feel allows you to find any problems early, when treatment is more likely to be successful. All women should practice breast self-awareness, including women who have had breast implants. How to do a breast self-exam One way to learn what is normal for your breasts and whether your breasts are changing is to do a breast self-exam. To do a breast self-exam: Look for Changes  1. Remove all the clothing above your waist. 2. Stand in front of a mirror in a room with good lighting. 3. Put your hands on your hips. 4. Push your hands firmly downward. 5. Compare your breasts in the mirror. Look for differences between them (asymmetry), such as: ? Differences in shape. ? Differences in size. ? Puckers, dips, and bumps in one breast and not the other. 6. Look at each breast for changes in your skin, such as: ? Redness. ? Scaly areas. 7. Look for changes in your nipples, such as: ? Discharge. ? Bleeding. ? Dimpling. ? Redness. ? A change in position. Feel for Changes Carefully feel your breasts for lumps and changes. It is best to do this while lying on your back on the floor and again while sitting or standing in the shower or tub with soapy water on your skin. Feel each breast in the following way:  Place the arm on the side of the breast you are examining above your head.  Feel your breast with the other hand.  Start in the nipple area and make  inch (2 cm) overlapping circles to feel your breast. Use the pads of your three middle fingers to do this. Apply light pressure, then medium pressure, then firm pressure. The light pressure will allow you to feel the tissue closest to the skin. The medium pressure will allow you to feel the tissue that is a little deeper. The firm pressure will allow you to feel the tissue  close to the ribs.  Continue the overlapping circles, moving downward over the breast until you feel your ribs below your breast.  Move one finger-width toward the center of the body. Continue to use the  inch (2 cm) overlapping circles to feel your breast as you move slowly up toward your collarbone.  Continue the up and down exam using all three pressures until you reach your armpit.  Write Down What You Find  Write down what is normal for each breast and any changes that you find. Keep a written record with breast changes or normal findings for each breast. By writing this information down, you do not need to depend only on memory for size, tenderness, or location. Write down where you are in your menstrual cycle, if you are still menstruating. If you are having trouble noticing differences   in your breasts, do not get discouraged. With time you will become more familiar with the variations in your breasts and more comfortable with the exam. How often should I examine my breasts? Examine your breasts every month. If you are breastfeeding, the best time to examine your breasts is after a feeding or after using a breast pump. If you menstruate, the best time to examine your breasts is 5-7 days after your period is over. During your period, your breasts are lumpier, and it may be more difficult to notice changes. When should I see my health care provider? See your health care provider if you notice:  A change in shape or size of your breasts or nipples.  A change in the skin of your breast or nipples, such as a reddened or scaly area.  Unusual discharge from your nipples.  A lump or thick area that was not there before.  Pain in your breasts.  Anything that concerns you.  

## 2020-04-22 DIAGNOSIS — F320 Major depressive disorder, single episode, mild: Secondary | ICD-10-CM | POA: Diagnosis not present

## 2020-04-22 DIAGNOSIS — F411 Generalized anxiety disorder: Secondary | ICD-10-CM | POA: Diagnosis not present

## 2020-05-02 DIAGNOSIS — Z79899 Other long term (current) drug therapy: Secondary | ICD-10-CM | POA: Diagnosis not present

## 2020-05-02 DIAGNOSIS — E1165 Type 2 diabetes mellitus with hyperglycemia: Secondary | ICD-10-CM | POA: Diagnosis not present

## 2020-05-03 DIAGNOSIS — F320 Major depressive disorder, single episode, mild: Secondary | ICD-10-CM | POA: Diagnosis not present

## 2020-05-03 DIAGNOSIS — F411 Generalized anxiety disorder: Secondary | ICD-10-CM | POA: Diagnosis not present

## 2020-05-10 DIAGNOSIS — G4700 Insomnia, unspecified: Secondary | ICD-10-CM | POA: Diagnosis not present

## 2020-05-10 DIAGNOSIS — Z9989 Dependence on other enabling machines and devices: Secondary | ICD-10-CM | POA: Diagnosis not present

## 2020-05-10 DIAGNOSIS — G4733 Obstructive sleep apnea (adult) (pediatric): Secondary | ICD-10-CM | POA: Diagnosis not present

## 2020-05-14 DIAGNOSIS — Z20822 Contact with and (suspected) exposure to covid-19: Secondary | ICD-10-CM | POA: Diagnosis not present

## 2020-05-20 ENCOUNTER — Inpatient Hospital Stay (HOSPITAL_COMMUNITY)
Admission: EM | Admit: 2020-05-20 | Discharge: 2020-06-11 | DRG: 177 | Disposition: A | Discharge status: Home Health | Payer: BC Managed Care – PPO | Attending: Internal Medicine | Admitting: Internal Medicine

## 2020-05-20 ENCOUNTER — Other Ambulatory Visit: Payer: Self-pay

## 2020-05-20 ENCOUNTER — Encounter (HOSPITAL_COMMUNITY): Payer: Self-pay | Admitting: *Deleted

## 2020-05-20 ENCOUNTER — Emergency Department (HOSPITAL_COMMUNITY): Payer: BC Managed Care – PPO

## 2020-05-20 DIAGNOSIS — G4733 Obstructive sleep apnea (adult) (pediatric): Secondary | ICD-10-CM | POA: Diagnosis present

## 2020-05-20 DIAGNOSIS — K760 Fatty (change of) liver, not elsewhere classified: Secondary | ICD-10-CM | POA: Diagnosis present

## 2020-05-20 DIAGNOSIS — R7401 Elevation of levels of liver transaminase levels: Secondary | ICD-10-CM | POA: Diagnosis present

## 2020-05-20 DIAGNOSIS — Z7984 Long term (current) use of oral hypoglycemic drugs: Secondary | ICD-10-CM | POA: Diagnosis not present

## 2020-05-20 DIAGNOSIS — B370 Candidal stomatitis: Secondary | ICD-10-CM | POA: Diagnosis not present

## 2020-05-20 DIAGNOSIS — R7989 Other specified abnormal findings of blood chemistry: Secondary | ICD-10-CM | POA: Diagnosis not present

## 2020-05-20 DIAGNOSIS — J9600 Acute respiratory failure, unspecified whether with hypoxia or hypercapnia: Secondary | ICD-10-CM | POA: Diagnosis not present

## 2020-05-20 DIAGNOSIS — I82453 Acute embolism and thrombosis of peroneal vein, bilateral: Secondary | ICD-10-CM | POA: Diagnosis not present

## 2020-05-20 DIAGNOSIS — E871 Hypo-osmolality and hyponatremia: Secondary | ICD-10-CM | POA: Diagnosis present

## 2020-05-20 DIAGNOSIS — J90 Pleural effusion, not elsewhere classified: Secondary | ICD-10-CM | POA: Diagnosis not present

## 2020-05-20 DIAGNOSIS — J1282 Pneumonia due to coronavirus disease 2019: Secondary | ICD-10-CM | POA: Diagnosis not present

## 2020-05-20 DIAGNOSIS — A0839 Other viral enteritis: Secondary | ICD-10-CM | POA: Diagnosis not present

## 2020-05-20 DIAGNOSIS — K219 Gastro-esophageal reflux disease without esophagitis: Secondary | ICD-10-CM | POA: Diagnosis present

## 2020-05-20 DIAGNOSIS — T380X5A Adverse effect of glucocorticoids and synthetic analogues, initial encounter: Secondary | ICD-10-CM | POA: Diagnosis not present

## 2020-05-20 DIAGNOSIS — I824Z9 Acute embolism and thrombosis of unspecified deep veins of unspecified distal lower extremity: Secondary | ICD-10-CM

## 2020-05-20 DIAGNOSIS — I824Z3 Acute embolism and thrombosis of unspecified deep veins of distal lower extremity, bilateral: Secondary | ICD-10-CM | POA: Diagnosis not present

## 2020-05-20 DIAGNOSIS — I10 Essential (primary) hypertension: Secondary | ICD-10-CM | POA: Diagnosis not present

## 2020-05-20 DIAGNOSIS — F320 Major depressive disorder, single episode, mild: Secondary | ICD-10-CM | POA: Diagnosis not present

## 2020-05-20 DIAGNOSIS — F411 Generalized anxiety disorder: Secondary | ICD-10-CM | POA: Diagnosis not present

## 2020-05-20 DIAGNOSIS — F458 Other somatoform disorders: Secondary | ICD-10-CM | POA: Diagnosis present

## 2020-05-20 DIAGNOSIS — Z6841 Body Mass Index (BMI) 40.0 and over, adult: Secondary | ICD-10-CM

## 2020-05-20 DIAGNOSIS — Z79899 Other long term (current) drug therapy: Secondary | ICD-10-CM

## 2020-05-20 DIAGNOSIS — Z793 Long term (current) use of hormonal contraceptives: Secondary | ICD-10-CM

## 2020-05-20 DIAGNOSIS — J9601 Acute respiratory failure with hypoxia: Secondary | ICD-10-CM | POA: Diagnosis not present

## 2020-05-20 DIAGNOSIS — R000 Tachycardia, unspecified: Secondary | ICD-10-CM | POA: Diagnosis not present

## 2020-05-20 DIAGNOSIS — K449 Diaphragmatic hernia without obstruction or gangrene: Secondary | ICD-10-CM | POA: Diagnosis not present

## 2020-05-20 DIAGNOSIS — J9811 Atelectasis: Secondary | ICD-10-CM | POA: Diagnosis not present

## 2020-05-20 DIAGNOSIS — F32A Depression, unspecified: Secondary | ICD-10-CM | POA: Diagnosis present

## 2020-05-20 DIAGNOSIS — R0689 Other abnormalities of breathing: Secondary | ICD-10-CM | POA: Diagnosis not present

## 2020-05-20 DIAGNOSIS — R0902 Hypoxemia: Secondary | ICD-10-CM | POA: Diagnosis not present

## 2020-05-20 DIAGNOSIS — J159 Unspecified bacterial pneumonia: Secondary | ICD-10-CM | POA: Diagnosis not present

## 2020-05-20 DIAGNOSIS — Z713 Dietary counseling and surveillance: Secondary | ICD-10-CM | POA: Diagnosis not present

## 2020-05-20 DIAGNOSIS — U071 COVID-19: Principal | ICD-10-CM | POA: Diagnosis present

## 2020-05-20 DIAGNOSIS — J80 Acute respiratory distress syndrome: Secondary | ICD-10-CM | POA: Diagnosis not present

## 2020-05-20 DIAGNOSIS — E1165 Type 2 diabetes mellitus with hyperglycemia: Secondary | ICD-10-CM | POA: Diagnosis not present

## 2020-05-20 DIAGNOSIS — E282 Polycystic ovarian syndrome: Secondary | ICD-10-CM | POA: Diagnosis present

## 2020-05-20 DIAGNOSIS — J189 Pneumonia, unspecified organism: Secondary | ICD-10-CM | POA: Diagnosis not present

## 2020-05-20 DIAGNOSIS — Z9119 Patient's noncompliance with other medical treatment and regimen: Secondary | ICD-10-CM

## 2020-05-20 DIAGNOSIS — R0602 Shortness of breath: Secondary | ICD-10-CM | POA: Diagnosis not present

## 2020-05-20 DIAGNOSIS — R791 Abnormal coagulation profile: Secondary | ICD-10-CM | POA: Diagnosis not present

## 2020-05-20 DIAGNOSIS — Z833 Family history of diabetes mellitus: Secondary | ICD-10-CM

## 2020-05-20 DIAGNOSIS — I824Y3 Acute embolism and thrombosis of unspecified deep veins of proximal lower extremity, bilateral: Secondary | ICD-10-CM | POA: Diagnosis not present

## 2020-05-20 LAB — CBC WITH DIFFERENTIAL/PLATELET
Abs Immature Granulocytes: 0.02 10*3/uL (ref 0.00–0.07)
Basophils Absolute: 0 10*3/uL (ref 0.0–0.1)
Basophils Relative: 0 %
Eosinophils Absolute: 0 10*3/uL (ref 0.0–0.5)
Eosinophils Relative: 0 %
HCT: 36.6 % (ref 36.0–46.0)
Hemoglobin: 12.5 g/dL (ref 12.0–15.0)
Immature Granulocytes: 0 %
Lymphocytes Relative: 17 %
Lymphs Abs: 1.2 10*3/uL (ref 0.7–4.0)
MCH: 27.7 pg (ref 26.0–34.0)
MCHC: 34.2 g/dL (ref 30.0–36.0)
MCV: 81.2 fL (ref 80.0–100.0)
Monocytes Absolute: 0.5 10*3/uL (ref 0.1–1.0)
Monocytes Relative: 6 %
Neutro Abs: 5.7 10*3/uL (ref 1.7–7.7)
Neutrophils Relative %: 77 %
Platelets: 289 10*3/uL (ref 150–400)
RBC: 4.51 MIL/uL (ref 3.87–5.11)
RDW: 14.4 % (ref 11.5–15.5)
WBC: 7.4 10*3/uL (ref 4.0–10.5)
nRBC: 0 % (ref 0.0–0.2)

## 2020-05-20 LAB — COMPREHENSIVE METABOLIC PANEL
ALT: 32 U/L (ref 0–44)
AST: 45 U/L — ABNORMAL HIGH (ref 15–41)
Albumin: 3.4 g/dL — ABNORMAL LOW (ref 3.5–5.0)
Alkaline Phosphatase: 51 U/L (ref 38–126)
Anion gap: 10 (ref 5–15)
BUN: 7 mg/dL (ref 6–20)
CO2: 23 mmol/L (ref 22–32)
Calcium: 8.2 mg/dL — ABNORMAL LOW (ref 8.9–10.3)
Chloride: 99 mmol/L (ref 98–111)
Creatinine, Ser: 0.85 mg/dL (ref 0.44–1.00)
GFR, Estimated: >60 mL/min (ref >60)
Glucose, Bld: 178 mg/dL — ABNORMAL HIGH (ref 70–99)
Potassium: 3.8 mmol/L (ref 3.5–5.1)
Sodium: 132 mmol/L — ABNORMAL LOW (ref 135–145)
Total Bilirubin: 0.5 mg/dL (ref 0.3–1.2)
Total Protein: 7.6 g/dL (ref 6.5–8.1)

## 2020-05-20 LAB — FIBRINOGEN: Fibrinogen: 315 mg/dL (ref 210–475)

## 2020-05-20 LAB — RESP PANEL BY RT-PCR (FLU A&B, COVID) ARPGX2
Influenza A by PCR: NEGATIVE
Influenza B by PCR: NEGATIVE
SARS Coronavirus 2 by RT PCR: POSITIVE — AB

## 2020-05-20 LAB — CBG MONITORING, ED: Glucose-Capillary: 220 mg/dL — ABNORMAL HIGH (ref 70–99)

## 2020-05-20 LAB — GLUCOSE, CAPILLARY: Glucose-Capillary: 262 mg/dL — ABNORMAL HIGH (ref 70–99)

## 2020-05-20 LAB — I-STAT BETA HCG BLOOD, ED (MC, WL, AP ONLY): I-stat hCG, quantitative: <5 m[IU]/mL (ref <5)

## 2020-05-20 LAB — TRIGLYCERIDES: Triglycerides: 99 mg/dL (ref <150)

## 2020-05-20 LAB — C-REACTIVE PROTEIN: CRP: 23.1 mg/dL — ABNORMAL HIGH (ref <1.0)

## 2020-05-20 LAB — LACTIC ACID, PLASMA: Lactic Acid, Venous: 0.8 mmol/L (ref 0.5–1.9)

## 2020-05-20 LAB — LACTATE DEHYDROGENASE: LDH: 302 U/L — ABNORMAL HIGH (ref 98–192)

## 2020-05-20 LAB — D-DIMER, QUANTITATIVE: D-Dimer, Quant: 0.61 ug/mL-FEU — ABNORMAL HIGH (ref 0.00–0.50)

## 2020-05-20 LAB — PROCALCITONIN: Procalcitonin: 0.2 ng/mL

## 2020-05-20 LAB — FERRITIN: Ferritin: 295 ng/mL (ref 11–307)

## 2020-05-20 MED ORDER — PREDNISONE 20 MG PO TABS: 50.0000 mg | ORAL_TABLET | Freq: Every day | ORAL | Status: DC

## 2020-05-20 MED ORDER — ACETAMINOPHEN 325 MG PO TABS
650.0000 mg | ORAL_TABLET | Freq: Once | ORAL | Status: AC
  Administered 2020-05-20: 650 mg via ORAL
  Filled 2020-05-20: qty 2

## 2020-05-20 MED ORDER — SODIUM CHLORIDE 0.9 % IV SOLN
250.0000 mL | INTRAVENOUS | Status: DC | PRN
  Administered 2020-05-23 – 2020-05-30 (×2): 250 mL via INTRAVENOUS

## 2020-05-20 MED ORDER — SODIUM CHLORIDE 0.9% FLUSH
3.0000 mL | Freq: Two times a day (BID) | INTRAVENOUS | Status: DC
  Administered 2020-05-21 – 2020-05-29 (×6): 3 mL via INTRAVENOUS

## 2020-05-20 MED ORDER — DEXAMETHASONE SODIUM PHOSPHATE 10 MG/ML IJ SOLN
10.0000 mg | Freq: Once | INTRAMUSCULAR | Status: AC
  Administered 2020-05-20: 10 mg via INTRAVENOUS
  Filled 2020-05-20: qty 1

## 2020-05-20 MED ORDER — SODIUM CHLORIDE 0.9% FLUSH
3.0000 mL | Freq: Two times a day (BID) | INTRAVENOUS | Status: DC
  Administered 2020-05-20 – 2020-05-29 (×15): 3 mL via INTRAVENOUS

## 2020-05-20 MED ORDER — ENOXAPARIN SODIUM 80 MG/0.8ML ~~LOC~~ SOLN
70.0000 mg | Freq: Every day | SUBCUTANEOUS | Status: DC
  Administered 2020-05-20 – 2020-05-23 (×4): 70 mg via SUBCUTANEOUS
  Filled 2020-05-20: qty 0.7
  Filled 2020-05-20 (×2): qty 0.8
  Filled 2020-05-20: qty 0.7

## 2020-05-20 MED ORDER — HYDROCODONE-ACETAMINOPHEN 5-325 MG PO TABS: 1.0000 | ORAL_TABLET | ORAL | Status: DC | PRN

## 2020-05-20 MED ORDER — SODIUM CHLORIDE 0.9 % IV SOLN
200.0000 mg | Freq: Once | INTRAVENOUS | Status: DC
  Filled 2020-05-20: qty 40

## 2020-05-20 MED ORDER — ALBUTEROL SULFATE HFA 108 (90 BASE) MCG/ACT IN AERS
2.0000 | INHALATION_SPRAY | Freq: Once | RESPIRATORY_TRACT | Status: AC
  Administered 2020-05-20: 2 via RESPIRATORY_TRACT
  Filled 2020-05-20: qty 6.7

## 2020-05-20 MED ORDER — ACETAMINOPHEN 325 MG PO TABS
650.0000 mg | ORAL_TABLET | Freq: Four times a day (QID) | ORAL | Status: DC | PRN
  Administered 2020-05-21 – 2020-06-09 (×8): 650 mg via ORAL
  Filled 2020-05-20 (×8): qty 2

## 2020-05-20 MED ORDER — LINAGLIPTIN 5 MG PO TABS
5.0000 mg | ORAL_TABLET | Freq: Every day | ORAL | Status: DC
  Administered 2020-05-21 – 2020-06-11 (×22): 5 mg via ORAL
  Filled 2020-05-20 (×22): qty 1

## 2020-05-20 MED ORDER — INSULIN ASPART 100 UNIT/ML ~~LOC~~ SOLN
0.0000 [IU] | SUBCUTANEOUS | Status: DC
  Administered 2020-05-20 – 2020-05-21 (×3): 3 [IU] via SUBCUTANEOUS
  Administered 2020-05-21: 2 [IU] via SUBCUTANEOUS
  Administered 2020-05-21 (×2): 3 [IU] via SUBCUTANEOUS
  Administered 2020-05-21: 7 [IU] via SUBCUTANEOUS
  Administered 2020-05-22: 5 [IU] via SUBCUTANEOUS
  Administered 2020-05-22 (×2): 3 [IU] via SUBCUTANEOUS
  Administered 2020-05-22: 5 [IU] via SUBCUTANEOUS
  Administered 2020-05-22: 3 [IU] via SUBCUTANEOUS
  Administered 2020-05-22: 5 [IU] via SUBCUTANEOUS
  Administered 2020-05-23: 3 [IU] via SUBCUTANEOUS
  Administered 2020-05-23: 5 [IU] via SUBCUTANEOUS
  Administered 2020-05-23 (×2): 3 [IU] via SUBCUTANEOUS
  Administered 2020-05-23: 2 [IU] via SUBCUTANEOUS
  Administered 2020-05-23 – 2020-05-24 (×3): 5 [IU] via SUBCUTANEOUS
  Administered 2020-05-24: 3 [IU] via SUBCUTANEOUS
  Administered 2020-05-24 (×2): 5 [IU] via SUBCUTANEOUS
  Administered 2020-05-24: 7 [IU] via SUBCUTANEOUS
  Administered 2020-05-25 (×2): 3 [IU] via SUBCUTANEOUS
  Filled 2020-05-20: qty 0.09

## 2020-05-20 MED ORDER — METHYLPREDNISOLONE SODIUM SUCC 125 MG IJ SOLR
80.0000 mg | Freq: Two times a day (BID) | INTRAMUSCULAR | Status: DC
  Administered 2020-05-21 – 2020-05-23 (×5): 80 mg via INTRAVENOUS
  Filled 2020-05-20 (×5): qty 2

## 2020-05-20 MED ORDER — ONDANSETRON HCL 4 MG/2ML IJ SOLN: 4.0000 mg | Freq: Four times a day (QID) | INTRAMUSCULAR | Status: DC | PRN

## 2020-05-20 MED ORDER — ALBUTEROL SULFATE HFA 108 (90 BASE) MCG/ACT IN AERS: 2.0000 | INHALATION_SPRAY | Freq: Four times a day (QID) | RESPIRATORY_TRACT | Status: DC

## 2020-05-20 MED ORDER — SODIUM CHLORIDE 0.9% FLUSH: 3.0000 mL | INTRAVENOUS | Status: DC | PRN

## 2020-05-20 MED ORDER — GUAIFENESIN-DM 100-10 MG/5ML PO SYRP
10.0000 mL | ORAL_SOLUTION | ORAL | Status: DC | PRN
  Administered 2020-05-20 – 2020-06-10 (×16): 10 mL via ORAL
  Filled 2020-05-20 (×16): qty 10

## 2020-05-20 MED ORDER — SODIUM CHLORIDE 0.9 % IV SOLN: 100.0000 mg | Freq: Every day | INTRAVENOUS | Status: DC

## 2020-05-20 MED ORDER — ALBUTEROL SULFATE HFA 108 (90 BASE) MCG/ACT IN AERS
2.0000 | INHALATION_SPRAY | RESPIRATORY_TRACT | Status: DC
  Administered 2020-05-20 – 2020-05-30 (×55): 2 via RESPIRATORY_TRACT
  Filled 2020-05-20: qty 6.7

## 2020-05-20 MED ORDER — ONDANSETRON HCL 4 MG PO TABS: 4.0000 mg | ORAL_TABLET | Freq: Four times a day (QID) | ORAL | Status: DC | PRN

## 2020-05-20 MED ORDER — PHENOL 1.4 % MT LIQD
1.0000 | OROMUCOSAL | Status: DC | PRN
  Administered 2020-05-20: 1 via OROMUCOSAL
  Filled 2020-05-20: qty 177

## 2020-05-20 NOTE — ED Notes (Signed)
Called to see if they can approve the bed on 5W.

## 2020-05-20 NOTE — ED Provider Notes (Signed)
[facility] DEPT Provider Note   CSN: [id number] Arrival date & time: 05/20/20  1245     History Chief Complaint  Patient presents with  . Covid Positive  . Cough  . Shortness of Breath  . Fever  . Sore Throat    Diane Padilla is a 34 y.o. female.  HPI   34 year old female with a history of allergies, back pain, depression, diabetes, GERD, PCOS, sleep apnea, presents emergency department today for evaluation of shortness of breath.  States that she started becoming symptomatic of Covid 8 days ago.  She has had fevers, cough, some nasal congestion, loss of taste and smell and diarrhea.  Yesterday she felt more short of breath than normal and today shortness of breath is even worse.  She denies any chest pain.  She is unvaccinated and has had the monoclonal antibody therapy.  Past Medical History:  Diagnosis Date  . Allergy   . Back pain   . Depression   . Diabetes mellitus without complication ([place]) 03/15/18   no meds at this time  . Dyspnea   . GERD (gastroesophageal reflux disease)   . PCOS (polycystic ovarian syndrome)   . Sleep apnea   . Vitamin D deficiency     Patient Active Problem List   Diagnosis Date Noted  . Sleep apnea 12/14/2019  . Type 2 diabetes mellitus with hyperglycemia, without long-term current use of insulin ([place]) 12/09/2018  . Class 3 severe obesity with serious comorbidity and body mass index (BMI) of 50.0 to 59.9 in adult ([place]) 12/09/2018  . PCOS (polycystic ovarian syndrome) 01/09/2013  . Oligomenorrhea 12/12/2012    Past Surgical History:  Procedure Laterality Date  . WISDOM TOOTH EXTRACTION       OB History    Gravida  0   Para  0   Term  0   Preterm  0   AB  0   Living  0     SAB  0   TAB  0   Ectopic  0   Multiple  0   Live Births              Family History  Problem Relation Age of Onset  . Diabetes Mother   . Hypertension Mother   . Hyperlipidemia Mother   . Stroke  Mother   . Cancer Mother   . Kidney disease Mother   . Sleep apnea Mother   . Drug abuse Mother   . Obesity Mother   . Hypertension Maternal Grandmother   . Hyperlipidemia Maternal Grandmother   . Drug abuse Father   . Breast cancer Cousin 30       chemo is done and upcoming surgery - ?BRCA Negative    Social History   Tobacco Use  . Smoking status: Never Smoker  . Smokeless tobacco: Never Used  Vaping Use  . Vaping Use: Never used  Substance Use Topics  . Alcohol use: No  . Drug use: No    Home Medications Prior to Admission medications   Medication Sig Start Date End Date Taking? Authorizing Provider  ascorbic acid (VITAMIN C) 250 MG tablet Take 250 mg by mouth daily.    [provider]  Cholecalciferol (VITAMIN D) 50 MCG (2000 UT) CAPS Take 1 capsule by mouth daily.    [provider]  diphenhydrAMINE (BENADRYL ALLERGY) 25 MG tablet Take 25 mg by mouth at bedtime as needed.     [provider]  diphenhydramine-acetaminophen (  TYLENOL PM) 25-500 MG TABS tablet Take 1 tablet by mouth at bedtime as needed.    [provider]  ELDERBERRY PO Take 1 tablet by mouth.    [provider]  glucose blood test strip Use as directed. Pharmacy, please dispense this brand of blood glucose test strips: OneTouch Verio 01/26/20 01/25/21  [provider]  hydrocortisone 2.5 % ointment Apply topically 2 (two) times daily as needed. 02/16/20   [provider]  ketoconazole (NIZORAL) 2 % cream Apply topically 2 (two) times daily as needed. 02/16/20   [provider]  medroxyPROGESTERone (PROVERA) 5 MG tablet Take for 5 days now and every month if no spontaneous cycle 02/09/20   Salvadore Dom, MD  metFORMIN (GLUCOPHAGE-XR) 500 MG 24 hr tablet Take 500 mg by mouth in the morning, at noon, and at bedtime.     [provider]  PRENATAL VIT-FE FUMARATE-FA PO Take 1 tablet by mouth.    [provider]  vitamin B-12  (CYANOCOBALAMIN) 1000 MCG tablet Take 1,000 mcg by mouth daily.    [provider]    Allergies    Patient has no known allergies.  Review of Systems   Review of Systems  Constitutional: Positive for fever.  HENT: Positive for congestion, rhinorrhea and sore throat.   Eyes: Negative for visual disturbance.  Respiratory: Positive for cough and shortness of breath.   Cardiovascular: Negative for chest pain.  Gastrointestinal: Positive for diarrhea. Negative for abdominal pain, constipation, nausea and vomiting.  Genitourinary: Negative for dysuria and hematuria.  Musculoskeletal: Negative for back pain.  Skin: Negative for rash.  Neurological: Negative for syncope.  All other systems reviewed and are negative.   Physical Exam Updated Vital Signs BP (!) 155/89   Pulse (!) 114   Temp 100.2 F (37.9 C) (Oral)   Resp (!) 37   LMP 05/07/2020   SpO2 91%   Physical Exam Vitals and nursing note reviewed.  Constitutional:      General: She is not in acute distress.    Appearance: She is well-developed.  HENT:     Head: Normocephalic and atraumatic.  Eyes:     Conjunctiva/sclera: Conjunctivae normal.  Cardiovascular:     Rate and Rhythm: Regular rhythm. Tachycardia present.     Heart sounds: No murmur heard.   Pulmonary:     Effort: Tachypnea present. No respiratory distress.     Breath sounds: Examination of the right-lower field reveals rales. Examination of the left-lower field reveals rales. Rales present.  Abdominal:     General: Bowel sounds are normal.     Palpations: Abdomen is soft.     Tenderness: There is no abdominal tenderness. There is no guarding or rebound.  Musculoskeletal:     Cervical back: Neck supple.  Skin:    General: Skin is warm and dry.  Neurological:     Mental Status: She is alert.     ED Results / Procedures / Treatments   Labs (all labs ordered are listed, but only abnormal results are displayed) Labs Reviewed  RESP PANEL BY  RT-PCR (FLU A&B, COVID) ARPGX2  CULTURE, BLOOD (ROUTINE X 2)  CULTURE, BLOOD (ROUTINE X 2)  LACTIC ACID, PLASMA  LACTIC ACID, PLASMA  CBC WITH DIFFERENTIAL/PLATELET  COMPREHENSIVE METABOLIC PANEL  D-DIMER, QUANTITATIVE (NOT AT [facility])  PROCALCITONIN  LACTATE DEHYDROGENASE  FERRITIN  TRIGLYCERIDES  FIBRINOGEN  C-REACTIVE PROTEIN  I-STAT BETA HCG BLOOD, ED ([facility], [facility], [facility] ONLY)    EKG None  Radiology DG Chest Port 1 View  Result Date: 05/20/2020 CLINICAL DATA:  COVID EXAM: PORTABLE CHEST 1 VIEW COMPARISON:  None. FINDINGS: Multifocal patchy opacities in the right upper lobe and bilateral lower lobes. No pleural effusion or pneumothorax. The heart is normal in size. IMPRESSION: Multifocal pneumonia in this patient with known COVID. Electronically Signed   By: Julian Hy M.D.   On: 05/20/2020 14:12    Procedures .Critical Care Performed by: Rodney Booze, PA-C Authorized by: Rodney Booze, PA-C   Critical care provider statement:    Critical care time (minutes):  31   Critical care time was exclusive of:  Separately billable procedures and treating other patients   Critical care was necessary to treat or prevent imminent or life-threatening deterioration of the following conditions:  Respiratory failure   Critical care was time spent personally by me on the following activities:  Examination of patient, review of old charts, pulse oximetry, ordering and review of radiographic studies, ordering and performing treatments and interventions and ordering and review of laboratory studies   I assumed direction of critical care for this patient from another provider in my specialty: no     (including critical care time)  Medications Ordered in ED Medications  dexamethasone (DECADRON) injection 10 mg (10 mg Intravenous Given 05/20/20 1427)  acetaminophen (TYLENOL) tablet 650 mg (650 mg Oral Given 05/20/20 1427)  albuterol (VENTOLIN HFA) 108 (90 Base) MCG/ACT inhaler 2 puff (2  puffs Inhalation Given 05/20/20 1427)    ED Course  I have reviewed the triage vital signs and the nursing notes.  Pertinent labs & imaging results that were available during my care of the patient were reviewed by me and considered in my medical decision making (see chart for details).    MDM Rules/Calculators/A&P                          33 year old female with known Covid presenting for evaluation of shortness of breath.  On arrival hypoxic down to 84% on room air on my evaluation.  Requiring 2 to 4 L of oxygen.  Also tachycardic and borderline febrile.  Chest x-ray reviewed/interpreted with multifocal pneumonia consistent with Covid  At shift change, patient care signed out to [facility], PA-C with plan to follow-up on pending laboratory work. Patient was given Decadron, albuterol, Tylenol in the ED.  Will need admission for further treatment of Covid with acute hypoxic respiratory failure.  Final Clinical Impression(s) / ED Diagnoses Final diagnoses:  Acute respiratory failure with hypoxia [facility])    Rx / DC Orders ED Discharge Orders    None       Rodney Booze, PA-C 05/20/20 [place], Ankit, MD 05/21/20 [phone]

## 2020-05-20 NOTE — ED Triage Notes (Signed)
Per EMS, patient from home, Covid symptoms of cough, sore throat, fever, started on 11/18 and positive test result on 11/21. Reports SOB on exertion. 95% 4L [place].

## 2020-05-20 NOTE — Progress Notes (Signed)
   05/20/20 2118  Assess: MEWS Score  Temp 99.3 F (37.4 C)  BP (!) 154/70  Pulse Rate (!) 114  Resp (!) 32  Level of Consciousness Alert  SpO2 94 %  O2 Device Nasal Cannula  O2 Flow Rate (L/min) 3 L/min  Assess: MEWS Score  MEWS Temp 0  MEWS Systolic 0  MEWS Pulse 2  MEWS RR 2  MEWS LOC 0  MEWS Score 4  MEWS Score Color Red  Assess: if the MEWS score is Yellow or Red  Were vital signs taken at a resting state? Yes  Focused Assessment No change from prior assessment  Early Detection of Sepsis Score *See Row Information* Low  MEWS guidelines implemented *See Row Information* Yes  Take Vital Signs  Increase Vital Sign Frequency  Red: Q 1hr X 4 then Q 4hr X 4, if remains red, continue Q 4hrs  Escalate  MEWS: Escalate Red: discuss with charge nurse/RN and provider, consider discussing with RRT  Notify: Charge Nurse/RN  Name of Charge Nurse/RN Notified Debbie, RN  Date Charge Nurse/RN Notified 05/20/20  Time Charge Nurse/RN Notified 2123  Notify: Rapid Response  Name of Rapid Response RN Notified Janelle, RN  Date Rapid Response Notified 05/20/20  Time Rapid Response Notified 2132  Document  Patient Outcome Stabilized after interventions    Pt received from ED with the vital signs reported above. Charge RN Debbie, [facility] and Rapid Response Nurses were informed that patient has triggered Red MEWS score again. Respiratory therapist called to the bedside. O2 increased to 6L. Albuterol frequency is now every 4 hours. Patient educated on incentive spirometer, cough and deep breathing, and proning position. Primary nurse placed patient HFNC in the case she requires more O2 throughout the night.

## 2020-05-20 NOTE — ED Notes (Signed)
Diane Padilla [phone] husbands -phone number.

## 2020-05-20 NOTE — H&P (Signed)
[name] BSW:[id number] DOB: Mar 04, 1986 DOA: 05/20/2020     PCP: [facility]   Outpatient Specialists:       NEurology  PA Nani Skillern    Patient arrived to ER on 05/20/20 at 1245 Referred by Attending Varney Biles, MD   Patient coming from: home Lives  With family    Chief Complaint:   Chief Complaint  Patient presents with  . Covid Positive  . Cough  . Shortness of Breath  . Fever  . Sore Throat    HPI: Diane Padilla is a 34 y.o. female with medical history significant of  Obesity, DM2, OSA, PCOS    Presented with  Covid symptoms of Sore throat, fever, cough, SOB, DOE loss of taste and smell nasal congestion no chest pain symptoms started on 11/18 Diagnosed on 11/21 Patient unvaccinated   Infectious risk factors:  Reports  fever, shortness of breath, dry cough,  Sore throat, URI symptoms, anosmia/change in taste, N/V/Diarrhea/abdominal pain,      KNOWN COVID POSITIVE   Has NOt been vaccinated against COVID    Initial COVID TEST   POSITIVE,   Lab Results  Component Value Date   SARSCOV2NAA POSITIVE (A) 05/20/2020    Regarding pertinent Chronic problems:   DM 2 -  Lab Results  Component Value Date   HGBA1C 6.1 (H) 01/19/2019  on PO meds only  Morbid obesity-   BMI Readings from Last 1 Encounters:  04/06/20 53.24 kg/m    OSA -on nocturnal oxygen, *CPAP, *noncompliant with CPAP   While in ER: Hypoxic down to 87 on RA requiring up to 4l OF o2  CXR showing bilateral PNA  Started on ramdesivir and Decadron   Hospitalist was called for admission for PNA due to [place]  The following Work up has been ordered so far:  Orders Placed This Encounter  Procedures  . Critical Care  . Resp Panel by RT-PCR (Flu A&B, Covid) Nasopharyngeal Swab  . Blood Culture (routine x 2)  . DG Chest Port 1 View  . Lactic acid, plasma  . CBC WITH DIFFERENTIAL  . Comprehensive metabolic panel  . D-dimer,  quantitative  . Procalcitonin  . Lactate dehydrogenase  . Ferritin  . Triglycerides  . Fibrinogen  . C-reactive protein  . Diet regular Room service appropriate? Yes; Fluid consistency: Thin  . Cardiac monitoring  . Insert peripheral IV x 2  . Initiate Carrier Fluid Protocol  . Place surgical mask on patient  . Patient to wear surgical mask during transportation  . Assess patient for ability to self-prone. If able (can move self in bed, ambulate) and stable (SpO2 and oxygen requirement):  . RN/NT - Document specific oxygen requirements in CHL  . Notify EDP if new oxygen requirements escalates > 4L per minute [place]  . RN to draw the following extra tubes:  . Consult to hospitalist  ALL PATIENTS BEING ADMITTED/HAVING PROCEDURES NEED COVID-19 SCREENING  . Airborne and Contact precautions  . Pulse oximetry, continuous  . I-Stat beta hCG blood, ED  . ED EKG 12-Lead    Following Medications were ordered in ER: Medications  remdesivir 200 mg in sodium chloride 0.9% 250 mL IVPB (has no administration in time range)    Followed by  remdesivir 100 mg in sodium chloride 0.9 % 100 mL IVPB (has no administration in time range)  dexamethasone (DECADRON) injection 10 mg (10 mg Intravenous Given 05/20/20 1427)  acetaminophen (TYLENOL) tablet 650 mg (  650 mg Oral Given 05/20/20 1427)  albuterol (VENTOLIN HFA) 108 (90 Base) MCG/ACT inhaler 2 puff (2 puffs Inhalation Given 05/20/20 1427)        Consult Orders  (From admission, onward)         Start     Ordered   05/20/20 1754  Consult to hospitalist  ALL PATIENTS BEING ADMITTED/HAVING PROCEDURES NEED COVID-19 SCREENING  Once       Comments: ALL PATIENTS BEING ADMITTED/HAVING PROCEDURES NEED COVID-19 SCREENING  Provider:  (Not yet assigned)  Question Answer Comment  Place call to: [facility]   Reason for Consult Admit      05/20/20 1753         Significant initial  Findings: Abnormal Labs Reviewed  RESP PANEL BY RT-PCR (FLU A&B,  COVID) ARPGX2 - Abnormal; Notable for the following components:      Result Value   SARS Coronavirus 2 by RT PCR POSITIVE (*)    All other components within normal limits  COMPREHENSIVE METABOLIC PANEL - Abnormal; Notable for the following components:   Sodium 132 (*)    Glucose, Bld 178 (*)    Calcium 8.2 (*)    Albumin 3.4 (*)    AST 45 (*)    All other components within normal limits  D-DIMER, QUANTITATIVE (NOT AT [facility]) - Abnormal; Notable for the following components:   D-Dimer, Quant 0.61 (*)    All other components within normal limits  LACTATE DEHYDROGENASE - Abnormal; Notable for the following components:   LDH 302 (*)    All other components within normal limits  C-REACTIVE PROTEIN - Abnormal; Notable for the following components:   CRP 23.1 (*)    All other components within normal limits    Otherwise labs showing:    Recent Labs  Lab 05/20/20 1459  NA 132*  K 3.8  CO2 23  GLUCOSE 178*  BUN 7  CREATININE 0.85  CALCIUM 8.2*    Cr  stable,   Lab Results  Component Value Date   CREATININE 0.85 05/20/2020   CREATININE 0.74 01/19/2019   CREATININE 0.79 08/26/2018    Recent Labs  Lab 05/20/20 1459  AST 45*  ALT 32  ALKPHOS 51  BILITOT 0.5  PROT 7.6  ALBUMIN 3.4*   Lab Results  Component Value Date   CALCIUM 8.2 (L) 05/20/2020    WBC        Component Value Date/Time   WBC 7.4 05/20/2020 1459   LYMPHSABS 1.2 05/20/2020 1459   LYMPHSABS 3.1 08/26/2018 0931   MONOABS 0.5 05/20/2020 1459   EOSABS 0.0 05/20/2020 1459   EOSABS 0.2 08/26/2018 0931   BASOSABS 0.0 05/20/2020 1459   BASOSABS 0.0 08/26/2018 0931     Plt: Lab Results  Component Value Date   PLT 289 05/20/2020   Lactic Acid, Venous    Component Value Date/Time   LATICACIDVEN 0.8 05/20/2020 1459   Procalcitonin 0.2   COVID-19 Labs  Recent Labs    05/20/20 1459  DDIMER 0.61*  FERRITIN 295  LDH 302*  CRP 23.1*     Lab Results  Component Value Date   SARSCOV2NAA  POSITIVE (A) 05/20/2020     HG/HCT   stable,       Component Value Date/Time   HGB 12.5 05/20/2020 1459   HGB 13.2 04/01/2019 0953   HGB 13.6 07/27/2013 0945   HCT 36.6 05/20/2020 1459   HCT 38.7 04/01/2019 0953   MCV 81.2 05/20/2020 1459   MCV 83  04/01/2019 0953        ECG: Ordered Personally reviewed by me showing: HR : 112 Rhythm:  Sinus tachycardia     no evidence of ischemic changes QTC 451  Ordered     CXR - Multifocal pneumonia in this patient with known COVID.    ED Triage Vitals  Enc Vitals Group     BP 05/20/20 1345 (!) 137/100     Pulse Rate 05/20/20 1328 (!) 120     Resp 05/20/20 1328 (!) 23     Temp 05/20/20 1328 100.2 F (37.9 C)     Temp Source 05/20/20 1328 Oral     SpO2 05/20/20 1328 (!) 87 %     Weight --      Height --      Head Circumference --      Peak Flow --      Pain Score 05/20/20 1324 8     Pain Loc --      Pain Edu? --      Excl. in GC? --   TMAX(24)@       Latest  Blood pressure (!) 163/94, pulse (!) 111, temperature 99.5 F (37.5 C), temperature source Oral, resp. rate (!) 29, last menstrual period 05/07/2020, SpO2 96 %.      Review of Systems:    Pertinent positives include:  Fevers, chills, fatigue,  nasal congestion,  shortness of breath at rest.  dyspnea on exertion,  loss of appetite Constitutional:  No weight loss, night sweats,  weight loss  HEENT:  No headaches, Difficulty swallowing,Tooth/dental problems,Sore throat,  No sneezing, itching, ear ache,post nasal drip,  Cardio-vascular:  No chest pain, Orthopnea, PND, anasarca, dizziness, palpitations.no Bilateral lower extremity swelling  GI:  No heartburn, indigestion, abdominal pain, nausea, vomiting, diarrhea, change in bowel habits,, melena, blood in stool, hematemesis Resp:  no  No excess mucus, no productive cough, No non-productive cough, No coughing up of blood.No change in color of mucus.No wheezing. Skin:  no rash or lesions. No jaundice GU:  no  dysuria, change in color of urine, no urgency or frequency. No straining to urinate.  No flank pain.  Musculoskeletal:  No joint pain or no joint swelling. No decreased range of motion. No back pain.  Psych:  No change in mood or affect. No depression or anxiety. No memory loss.  Neuro: no localizing neurological complaints, no tingling, no weakness, no double vision, no gait abnormality, no slurred speech, no confusion  All systems reviewed and apart from HOPI all are negative  Past Medical History:   Past Medical History:  Diagnosis Date  . Allergy   . Back pain   . Depression   . Diabetes mellitus without complication (HCC) 07/20/13   no meds at this time  . Dyspnea   . GERD (gastroesophageal reflux disease)   . PCOS (polycystic ovarian syndrome)   . Sleep apnea   . Vitamin D deficiency       Past Surgical History:  Procedure Laterality Date  . WISDOM TOOTH EXTRACTION      Social History:  Ambulatory independently      reports that she has never smoked. She has never used smokeless tobacco. She reports that she does not drink alcohol and does not use drugs.  Family History:   Family History  Problem Relation Age of Onset  . Diabetes Mother   . Hypertension Mother   . Hyperlipidemia Mother   . Stroke Mother   . Cancer Mother   .   Kidney disease Mother   . Sleep apnea Mother   . Drug abuse Mother   . Obesity Mother   . Hypertension Maternal Grandmother   . Hyperlipidemia Maternal Grandmother   . Drug abuse Father   . Breast cancer Cousin 30       chemo is done and upcoming surgery - ?BRCA Negative    Allergies: No Known Allergies   Prior to Admission medications   Medication Sig Start Date End Date Taking? Authorizing Provider  ascorbic acid (VITAMIN C) 250 MG tablet Take 250 mg by mouth daily.    [provider]  Cholecalciferol (VITAMIN D) 50 MCG (2000 UT) CAPS Take 1 capsule by mouth daily.    [provider]  diphenhydrAMINE  (BENADRYL ALLERGY) 25 MG tablet Take 25 mg by mouth at bedtime as needed.     [provider]  diphenhydramine-acetaminophen (TYLENOL PM) 25-500 MG TABS tablet Take 1 tablet by mouth at bedtime as needed.    [provider]  ELDERBERRY PO Take 1 tablet by mouth.    [provider]  glucose blood test strip Use as directed. Pharmacy, please dispense this brand of blood glucose test strips: OneTouch Verio 01/26/20 01/25/21  [provider]  hydrocortisone 2.5 % ointment Apply topically 2 (two) times daily as needed. 02/16/20   [provider]  ketoconazole (NIZORAL) 2 % cream Apply topically 2 (two) times daily as needed. 02/16/20   [provider]  medroxyPROGESTERone (PROVERA) 5 MG tablet Take for 5 days now and every month if no spontaneous cycle 02/09/20   Salvadore Dom, MD  metFORMIN (GLUCOPHAGE-XR) 500 MG 24 hr tablet Take 500 mg by mouth in the morning, at noon, and at bedtime.     [provider]  PRENATAL VIT-FE FUMARATE-FA PO Take 1 tablet by mouth.    [provider]  vitamin B-12 (CYANOCOBALAMIN) 1000 MCG tablet Take 1,000 mcg by mouth daily.    [provider]   Physical Exam: Vitals with BMI 05/20/2020 05/20/2020 05/20/2020  Height - - -  Weight - - -  BMI - - -  Systolic 160 109 323  Diastolic 94 84 86  Pulse 557 103 105     1. General:  in No  Acute distress    Chronically ill -appearing 2. Psychological: Alert and   Oriented 3. Head/ENT:    Dry Mucous Membranes                          Head Non traumatic, neck supple                         Poor Dentition 4. SKIN:   decreased Skin turgor,  Skin clean Dry and intact no rash 5. Heart: Regular rate and rhythm no  Murmur, no Rub or gallop 6. Lungs:  no wheezes or crackles   7. Abdomen: Soft,  non-tender, Non distended  Obese bowel sounds present 8. Lower extremities: no clubbing, cyanosis, no edema 9. Neurologically Grossly intact, moving all  4 extremities equally   10. MSK: Normal range of motion   All other LABS:     Recent Labs  Lab 05/20/20 1459  WBC 7.4  NEUTROABS 5.7  HGB 12.5  HCT 36.6  MCV 81.2  PLT 289     Recent Labs  Lab 05/20/20 1459  NA 132*  K 3.8  CL 99  CO2 23  GLUCOSE  178*  BUN 7  CREATININE 0.85  CALCIUM 8.2*     Recent Labs  Lab 05/20/20 1459  AST 45*  ALT 32  ALKPHOS 51  BILITOT 0.5  PROT 7.6  ALBUMIN 3.4*    Cultures: No results found for: SDES, SPECREQUEST, CULT, REPTSTATUS   Radiological Exams on Admission: DG Chest Port 1 View  Result Date: 05/20/2020 CLINICAL DATA:  COVID EXAM: PORTABLE CHEST 1 VIEW COMPARISON:  None. FINDINGS: Multifocal patchy opacities in the right upper lobe and bilateral lower lobes. No pleural effusion or pneumothorax. The heart is normal in size. IMPRESSION: Multifocal pneumonia in this patient with known COVID. Electronically Signed   By: Sriyesh  Krishnan M.D.   On: 05/20/2020 14:12    Chart has been reviewed  Assessment/Plan   34 y.o. female with medical history significant of  Obesity, DM2, OSA, PCOS     Admitted for pneumonia due to COVID  Present on Admission: . Pneumonia due to COVID-19 virus -    FROM HOME WITH KNOWN HX OF COVID19 ER Novel Corona Virus testing:  Ordered 05/20/20 and is  positive  Immunization status:unvacinated    Following concerning LAB/ imaging findings:  COVID-19 Labs    ANC/ALC ratio>3.5   LFTs: increased AST/ALT/Tbili  CRP, LDH: increased    Procalcitonin: low 0.2 CXR: hazy bilateral peripheral opacities or otherwise abnormal         -Following work-up initiated:      sputum cultures   Ordered 05/20/20, Blood cultures  Ordered 05/20/20,     Following complications noted:   elevated LFT's likely in the setting of COVID continue to follow mild   acute respiratory failure with hypoxia - continue oxygen treatment  Plan of treatment: Admit on Airborn Precautions  -given severity of illness  initiate steroids Decadron 6mg q 24 hours And pharmacy consult for remdesivir  IF Hypoxia progresses rapidly  or  requiring high nasal flow   and no contraindications will attempt a trial of Baricitinib or Actemra   -  use of baricitinib discussed with patient-they are aware this is under EUA by FDA-she has no history of TB, hepatitis B, diverticulitis.  patient would like to discuss further the use of baricitinib if she decompensates.  - Will follow daily d.dimer - Assess for ability to prone  - Supportive management -Fluid sparing resuscitation  -Provide oxygen as needed currently on   SpO2: 94 % O2 Flow Rate (L/min): 4 L/min - IF d.dimer elvated >5 will increase dose of lovenox   - Consult PCCM if becomes respiratory unstable   Poor Prognostic factors  34 y.o.  Personal hx of  DM2,   obesity, NON-Vaccinated status Evidence of  organ damage  Present respiratory failure requiring >4L [place]  tachypnea, tachycardia present on admission  ABS neutrophil to lymphocyte ratio >3.5 Some Risk factors for Cytokine storm  CXR GGO Ferritin >250 CRP >4.6 and  elevated LFT,    or abnormal elctolytes    Will order Airborne and Contact precautions  Family/ patient prognosis discussion: I have discussed case with the family/ patient  who are aware of their prognosis At this point they would like to be full code    The treatment plan and use of medications and known side effects were discussed with patient/ family. It was clearly explained that there is no proven definitive treatment for COVID-19 infection yet. Any medications used here are based on case reports/anecdotal data which are not peer-reviewed and has not been studied using randomized   control trials.  Complete risks and long-term side effects are unknown, however in the best clinical judgment they seem to be of some clinical benefit rather than medical risks.  Patient/ family agree with the treatment plan and want to receive these treatments as  indicated.     . Type 2 diabetes mellitus with hyperglycemia, without long-term current use of insulin (HCC) -  - Order Sensitive SSI    -  check TSH and HgA1C  - Hold by mouth medications    . Acute respiratory failure due to COVID-19 (HCC) continuous pulse ox, assess for ability to prone  Hx of OSA - hold off on CPAP for now due to COVID infection  Other plan as per orders.  DVT prophylaxis:  Lovenox       Code Status:    Code Status: Not on file FULL CODE  as per patient  I had personally discussed CODE STATUS with patient     Family Communication:   Family not at  Bedside  plan of care was discussed on the phone with   Husband,  Fa,ily is concerned that patient only had a sandwitch to eat. Husband states he has read the hospital reviews and they were not the best so he would like to take her to a better hospital. Explained to family currently there is no medical indication for transfer. If family and patient wishes we can inquire from other facilities but there in no guarantee that bed would be available. Patient is unstable for discharge to home due to increased oxygen requirement.  Disposition Plan:          To home once workup is complete and patient is stable   Following barriers for discharge:                                                       Afebrile,                             Will need to be able to tolerate PO                            Will likely need home health, home O2, set up                                              Consults called:  none  Admission status:  ED Disposition    ED Disposition Condition Comment   Admit  The patient appears reasonably stabilized for admission considering the current resources, flow, and capabilities available in the ED at this time, and I doubt any other EMC requiring further screening and/or treatment in the ED prior to admission is  present.        inpatient     I Expect 2 midnight stay secondary to severity of  patient's current illness need for inpatient interventions justified by the following:  hemodynamic instability despite optimal treatment (tachycardia hypoxia, )  Severe lab/radiological/exam abnormalities including:    bilateral PNA and extensive comorbidities including:    DM2     Morbid Obesity   That are   currently affecting medical management.   I expect  patient to be hospitalized for 2 midnights requiring inpatient medical care.  Patient is at high risk for adverse outcome (such as loss of life or disability) if not treated.  Indication for inpatient stay as follows:   Hemodynamic instability despite maximal medical therapy,     Need for IV antivirals    Level of care  tele indefinitely please discontinue once patient no longer qualifies COVID-19 Labs    Lab Results  Component Value Date   [place] (A) 05/20/2020     Precautions: admitted as  covid positive Airborne and Contact precautions    PPE: Used by the provider:   P100  eye Goggles,  Gloves  gown    Batul Diego 05/20/2020, 7:45 PM     [facility]     after 2 AM please page floor coverage PA If 7AM-7PM, please contact the day team taking care of the patient using [email or web address]   Patient was evaluated in the context of the global COVID-19 pandemic, which necessitated consideration that the patient might be at risk for infection with the SARS-CoV-2 virus that causes COVID-19. Institutional protocols and algorithms that pertain to the evaluation of patients at risk for COVID-19 are in a state of rapid change based on information released by regulatory bodies including the CDC and federal and state organizations. These policies and algorithms were followed during the patient's care.

## 2020-05-20 NOTE — ED Notes (Signed)
Able to obtain only one blood culture at this time.

## 2020-05-20 NOTE — ED Provider Notes (Signed)
Accepted handoff at shift change from C. Couture PA-C. Please see prior provider note for more detail.   Briefly: Patient is 34 y.o. female with history of allergies, back pain, depression, diabetes, reflux, PCOS, sleep apnea  Presented to emergency department with shortness of breath.  She began having symptoms of Covid days ago.  She had fevers, cough, sinus congestion, loss of taste and smell and diarrhea.  She states her shortness of breath been worse over the past 2 days.  No chest pain.  She is unvaccinated has already received MAB therapy.   Physical Exam  BP (!) 149/85   Pulse (!) 110   Temp 99.5 F (37.5 C) (Oral)   Resp (!) 41   LMP 05/07/2020   SpO2 90%   CONSTITUTIONAL:   Fatigued, obese, ill appearing 34 year old female no significant increased work of breathing but tachypnea noted. NEURO:  Alert and oriented x 3, no focal deficits EYES:  pupils equal and reactive ENT/NECK:  trachea midline, no JVD CARDIO:  reg rate, reg rhythm, well-perfused PULM:   Tachypnea present, no significant increased work of breathing, speaking in full sentences, lungs are coarse in all fields. GI/GU:  Abdomen non-distended, obese, nontender MSK/SPINE:  No gross deformities, no edema SKIN:  no rash obvious, atraumatic, no ecchymosis  PSYCH:  Appropriate speech and behavior   ED Course/Procedures     Procedures Results for orders placed or performed during the hospital encounter of 05/20/20  Lactic acid, plasma  Result Value Ref Range   Lactic Acid, Venous 0.8 0.5 - 1.9 mmol/L  CBC WITH DIFFERENTIAL  Result Value Ref Range   WBC 7.4 4.0 - 10.5 K/uL   RBC 4.51 3.87 - 5.11 MIL/uL   Hemoglobin 12.5 12.0 - 15.0 g/dL   HCT 42.5 36 - 46 %   MCV 81.2 80.0 - 100.0 fL   MCH 27.7 26.0 - 34.0 pg   MCHC 34.2 30.0 - 36.0 g/dL   RDW 95.6 38.7 - 56.4 %   Platelets 289 150 - 400 K/uL   nRBC 0.0 0.0 - 0.2 %   Neutrophils Relative % 77 %   Neutro Abs 5.7 1.7 - 7.7 K/uL   Lymphocytes Relative 17 %    Lymphs Abs 1.2 0.7 - 4.0 K/uL   Monocytes Relative 6 %   Monocytes Absolute 0.5 0.1 - 1.0 K/uL   Eosinophils Relative 0 %   Eosinophils Absolute 0.0 0.0 - 0.5 K/uL   Basophils Relative 0 %   Basophils Absolute 0.0 0.0 - 0.1 K/uL   WBC Morphology OCCASSIONAL BANDS PRESENT    Immature Granulocytes 0 %   Abs Immature Granulocytes 0.02 0.00 - 0.07 K/uL   Reactive, Benign Lymphocytes PRESENT   Comprehensive metabolic panel  Result Value Ref Range   Sodium 132 (L) 135 - 145 mmol/L   Potassium 3.8 3.5 - 5.1 mmol/L   Chloride 99 98 - 111 mmol/L   CO2 23 22 - 32 mmol/L   Glucose, Bld 178 (H) 70 - 99 mg/dL   BUN 7 6 - 20 mg/dL   Creatinine, Ser 3.32 0.44 - 1.00 mg/dL   Calcium 8.2 (L) 8.9 - 10.3 mg/dL   Total Protein 7.6 6.5 - 8.1 g/dL   Albumin 3.4 (L) 3.5 - 5.0 g/dL   AST 45 (H) 15 - 41 U/L   ALT 32 0 - 44 U/L   Alkaline Phosphatase 51 38 - 126 U/L   Total Bilirubin 0.5 0.3 - 1.2 mg/dL   GFR, Estimated >  60 >60 mL/min   Anion gap 10 5 - 15  D-dimer, quantitative  Result Value Ref Range   D-Dimer, Quant 0.61 (H) 0.00 - 0.50 ug/mL-FEU  Procalcitonin  Result Value Ref Range   Procalcitonin 0.20 ng/mL  Lactate dehydrogenase  Result Value Ref Range   LDH 302 (H) 98 - 192 U/L  Triglycerides  Result Value Ref Range   Triglycerides 99 <150 mg/dL  Fibrinogen  Result Value Ref Range   Fibrinogen 315 210 - 475 mg/dL  I-Stat beta hCG blood, ED  Result Value Ref Range   I-stat hCG, quantitative <5.0 <5 mIU/mL   Comment 3           DG Chest Port 1 View  Result Date: 05/20/2020 CLINICAL DATA:  COVID EXAM: PORTABLE CHEST 1 VIEW COMPARISON:  None. FINDINGS: Multifocal patchy opacities in the right upper lobe and bilateral lower lobes. No pleural effusion or pneumothorax. The heart is normal in size. IMPRESSION: Multifocal pneumonia in this patient with known COVID. Electronically Signed   By: Charline Bills M.D.   On: 05/20/2020 14:12    MDM   She is understanding of plan.  Her  Covid test is positive. She is hypoxic. Dimer marginally elevated at 0.61 her chest x-ray reviewed by myself shows multifocal pneumonia consistent with known Covid I have low suspicion for pulmonary embolism in this patient.  CMP with mild hyponatremia mild elevated blood sugar no other significant electrolyte derangements.  Lactic acid within normal limits x1.  CBC without leukocytosis or anemia.  Inflammatory markers are only marginally elevated.  Admitted to the hospitalist service. 6:32 PM    Gailen Shelter, PA 05/20/20 1833    Sabino Donovan, MD 05/20/20 2329

## 2020-05-20 NOTE — Progress Notes (Signed)
Rapid Response Event Note   Reason for Call :  COVID positive patient in respiratory distress  Initial Focused Assessment:   Patient lying in bed calm, states it feels difficult to breathe; O2 saturation 92-94% on 3L/min; VSS, denies pain. Lung sounds are diminished bilaterally. Pt alert and oriented x 4.     Interventions: Assessment; Increased oxygen liter flow for comfort; educated about proning; toileting assistance provided, assisted patient to lay on her side. Assigned RN will provide breathing tx as prescribed.   Plan of Care:  Floor staff will continue to monitor.   Event Summary:  Patient states lying on her side is allowing her to breathe easier than before. Patient will remain in current location and primary RN can follow up with MD.  Lamona Curl, RN

## 2020-05-21 DIAGNOSIS — J9601 Acute respiratory failure with hypoxia: Secondary | ICD-10-CM | POA: Diagnosis not present

## 2020-05-21 DIAGNOSIS — E1165 Type 2 diabetes mellitus with hyperglycemia: Secondary | ICD-10-CM | POA: Diagnosis not present

## 2020-05-21 DIAGNOSIS — U071 COVID-19: Secondary | ICD-10-CM | POA: Diagnosis not present

## 2020-05-21 LAB — CBC WITH DIFFERENTIAL/PLATELET
Abs Immature Granulocytes: 0.04 10*3/uL (ref 0.00–0.07)
Basophils Absolute: 0 10*3/uL (ref 0.0–0.1)
Basophils Relative: 0 %
Eosinophils Absolute: 0 10*3/uL (ref 0.0–0.5)
Eosinophils Relative: 0 %
HCT: 37.3 % (ref 36.0–46.0)
Hemoglobin: 12.5 g/dL (ref 12.0–15.0)
Immature Granulocytes: 1 %
Lymphocytes Relative: 17 %
Lymphs Abs: 1.2 10*3/uL (ref 0.7–4.0)
MCH: 27.2 pg (ref 26.0–34.0)
MCHC: 33.5 g/dL (ref 30.0–36.0)
MCV: 81.3 fL (ref 80.0–100.0)
Monocytes Absolute: 0.4 10*3/uL (ref 0.1–1.0)
Monocytes Relative: 6 %
Neutro Abs: 5.7 10*3/uL (ref 1.7–7.7)
Neutrophils Relative %: 76 %
Platelets: 322 10*3/uL (ref 150–400)
RBC: 4.59 MIL/uL (ref 3.87–5.11)
RDW: 14.6 % (ref 11.5–15.5)
WBC: 7.3 10*3/uL (ref 4.0–10.5)
nRBC: 0 % (ref 0.0–0.2)

## 2020-05-21 LAB — GLUCOSE, CAPILLARY
Glucose-Capillary: 232 mg/dL — ABNORMAL HIGH (ref 70–99)
Glucose-Capillary: 216 mg/dL — ABNORMAL HIGH (ref 70–99)
Glucose-Capillary: 181 mg/dL — ABNORMAL HIGH (ref 70–99)
Glucose-Capillary: 310 mg/dL — ABNORMAL HIGH (ref 70–99)
Glucose-Capillary: 245 mg/dL — ABNORMAL HIGH (ref 70–99)
Glucose-Capillary: 249 mg/dL — ABNORMAL HIGH (ref 70–99)

## 2020-05-21 LAB — FERRITIN: Ferritin: 339 ng/mL — ABNORMAL HIGH (ref 11–307)

## 2020-05-21 LAB — COMPREHENSIVE METABOLIC PANEL
ALT: 37 U/L (ref 0–44)
AST: 44 U/L — ABNORMAL HIGH (ref 15–41)
Albumin: 3.3 g/dL — ABNORMAL LOW (ref 3.5–5.0)
Alkaline Phosphatase: 51 U/L (ref 38–126)
Anion gap: 10 (ref 5–15)
BUN: 9 mg/dL (ref 6–20)
CO2: 24 mmol/L (ref 22–32)
Calcium: 8.5 mg/dL — ABNORMAL LOW (ref 8.9–10.3)
Chloride: 102 mmol/L (ref 98–111)
Creatinine, Ser: 0.8 mg/dL (ref 0.44–1.00)
GFR, Estimated: >60 mL/min (ref >60)
Glucose, Bld: 216 mg/dL — ABNORMAL HIGH (ref 70–99)
Potassium: 4.1 mmol/L (ref 3.5–5.1)
Sodium: 136 mmol/L (ref 135–145)
Total Bilirubin: 0.4 mg/dL (ref 0.3–1.2)
Total Protein: 7.9 g/dL (ref 6.5–8.1)

## 2020-05-21 LAB — D-DIMER, QUANTITATIVE: D-Dimer, Quant: 0.53 ug/mL-FEU — ABNORMAL HIGH (ref 0.00–0.50)

## 2020-05-21 LAB — MAGNESIUM: Magnesium: 2.4 mg/dL (ref 1.7–2.4)

## 2020-05-21 LAB — PHOSPHORUS: Phosphorus: 2.5 mg/dL (ref 2.5–4.6)

## 2020-05-21 LAB — HEMOGLOBIN A1C
Hgb A1c MFr Bld: 6.6 % — ABNORMAL HIGH (ref 4.8–5.6)
Mean Plasma Glucose: 142.72 mg/dL

## 2020-05-21 LAB — ABO/RH: ABO/RH(D): O POS

## 2020-05-21 LAB — HIV ANTIBODY (ROUTINE TESTING W REFLEX): HIV Screen 4th Generation wRfx: NONREACTIVE

## 2020-05-21 LAB — C-REACTIVE PROTEIN: CRP: 24.6 mg/dL — ABNORMAL HIGH (ref <1.0)

## 2020-05-21 MED ORDER — BARICITINIB 2 MG PO TABS
4.0000 mg | ORAL_TABLET | Freq: Every day | ORAL | Status: AC
  Administered 2020-05-21 – 2020-06-03 (×14): 4 mg via ORAL
  Filled 2020-05-21 (×15): qty 2

## 2020-05-21 NOTE — Plan of Care (Signed)
Patient with increased respiratory rate now on 6.5L HFNC Patient has refused Remdesivir earlier Stating she prefers not to take it  Discussed again use of Actemra  -    discussed with patient-they are aware this is under EUA by FDA-she has no history of TB, hepatitis B, diverticulitis. patient at this time refuses use of Actemra or other immunomodulator     Discussed with patient the fact that her o2 requirement is going up And we are concerned regarding her prognosis Reiterated for patient to be in prone position as she able to tolerate  1:07 AM  Diane Padilla

## 2020-05-21 NOTE — Progress Notes (Signed)
PROGRESS NOTE  Diane Padilla PZW:[id number] DOB: 08-25-1985 DOA: 05/20/2020 PCP: [facility]   LOS: 1 day   Brief Narrative / Interim history: Diane Padilla is a 34 y.o. female with medical history significant of obesity, DM2, OSA, PCOS who was admitted to the hospital on 11/26 with acute hypoxic respiratory failure due to COVID-19 pneumonia.  She has been having symptoms for the past 9 days prior to hospitalization but over the last few days started experiencing shortness of breath and came to the hospital.  On admission she was found to be hypoxic requiring 6 L supplemental oxygen.  She did not receive the COVID-19 vaccine.  Subjective / 24h Interval events: States that she is feeling well this morning with supplemental oxygen.  Reports that she is getting very short of breath with minimal activity.  Assessment & Plan: Principal Problem Acute hypoxic respiratory failure due to COVID-19 pneumonia -Patient started on Solu-Medrol, continue -She is refusing remdesivir or Actemra, agrees with steroids and after discussing risks/benefits she agrees with baricitinib.  She has profound inflammation with significantly elevated CRP  -She is asking about monoclonal antibodies/ivermectin which are not indicated currently -Remains profoundly hypoxic requiring 6 L nasal cannula, closely monitor respiratory status. 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 albuterol  2 puff Inhalation Q4H  . enoxaparin (LOVENOX) injection  70 mg Subcutaneous QHS  . insulin aspart  0-9 Units Subcutaneous Q4H  . linagliptin  5 mg Oral Daily  . methylPREDNISolone (SOLU-MEDROL) injection  80 mg Intravenous Q12H   Followed by  . [START ON 05/24/2020] predniSONE  50 mg Oral Daily  . sodium chloride flush  3 mL Intravenous Q12H  . sodium chloride flush  3 mL Intravenous Q12H   Continuous Infusions: . sodium chloride    . remdesivir 200 mg in sodium chloride 0.9% 250 mL IVPB     Followed by  . remdesivir 100 mg in NS 100 mL     PRN Meds:.sodium chloride, acetaminophen, guaiFENesin-dextromethorphan, HYDROcodone-acetaminophen, ondansetron **OR** ondansetron (ZOFRAN) IV, phenol, sodium chloride flush  Diet Orders (From admission, onward)    Start     Ordered   05/20/20 2129  Diet Carb Modified Fluid consistency: Thin; Room service appropriate? Yes  Diet effective now       Question Answer Comment  Diet-HS Snack? Nothing   Calorie Level Medium 1600-2000   Fluid consistency: Thin   Room service appropriate? Yes      05/20/20 2129          DVT prophylaxis:      Code Status: Full Code  Family Communication: Full plan of care was discussed with the patient  Status is: Inpatient  Remains inpatient appropriate because:Inpatient level of care appropriate due to severity of illness   Dispo: The patient is from: Home  Anticipated d/c is to: Home              Anticipated d/c date is: > 3 days              Patient currently is not medically stable to d/c.   Consultants:  None   Procedures:  None   Microbiology  None   Antimicrobials: None     Objective: Vitals:   05/21/20 0017 05/21/20 0119 05/21/20 0524 05/21/20 0917  BP: (!) 151/87 (!) 145/82 (!) 148/86 (!) 160/91  Pulse: (!) 109 (!) 112 (!) 109 (!) 117  Resp: (!) 24 (!) 24 (!) 30 15   Temp: 99.8 F (37.7 C) 99.7 F (37.6 C) 98.7 F (37.1 C) 98.7 F (37.1 C)  TempSrc: Oral Oral Oral Oral  SpO2: 91% 94% 94% (!) 89%  Weight:      Height:       No intake or output data in the 24 hours ending 05/21/20 1001 Filed Weights   05/20/20 2118  Weight: (!) 139.2 kg    Examination:  Constitutional: No apparent distress but visibly tachypneic at times Eyes: no scleral icterus ENMT: Mucous membranes are moist.  Neck: normal, supple Respiratory: Overall distant breath sounds due to body habitus, no wheezing, no crackles, increased respiratory effort with tachypnea Cardiovascular: Regular rate and rhythm, no murmurs / rubs / gallops. No LE edema. Abdomen: non distended, no tenderness. Bowel sounds positive.  Musculoskeletal: no clubbing / cyanosis.  Skin: no rashes Neurologic: Nonfocal Psychiatric: Normal judgment and insight.   Data Reviewed: I have independently reviewed following labs and imaging studies   CBC: Recent Labs  Lab 05/20/20 1459 05/21/20 0438  WBC 7.4 7.3  NEUTROABS 5.7 5.7  HGB 12.5 12.5  HCT 36.6 37.3  MCV 81.2 81.3  PLT 289 322   Basic Metabolic Panel: Recent Labs  Lab 05/20/20 1459 05/21/20 0438  NA 132* 136  K 3.8 4.1  CL 99 102  CO2 23 24  GLUCOSE 178* 216*  BUN 7 9  CREATININE 0.85 0.80  CALCIUM 8.2* 8.5*  MG  --  2.4  PHOS  --  2.5   Liver Function Tests: Recent Labs  Lab 05/20/20 1459 05/21/20 0438  AST 45* 44*  ALT 32 37  ALKPHOS 51 51  BILITOT 0.5 0.4  PROT 7.6 7.9  ALBUMIN 3.4* 3.3*   Coagulation Profile: No results for input(s): INR, PROTIME in the last 168 hours. HbA1C: Recent Labs    05/21/20 0438  HGBA1C 6.6*   CBG: Recent Labs  Lab 05/20/20 2004 05/20/20 2150 05/20/20 2318 05/21/20 0350 05/21/20 0741  GLUCAP 220* 262* 232* 216* 181*    Recent Results (from the past 240 hour(s))  Resp Panel by RT-PCR (Flu A&B, Covid) Nasopharyngeal Swab     Status: Abnormal   Collection Time: 05/20/20   2:59 PM   Specimen: Nasopharyngeal Swab; Nasopharyngeal(NP) swabs in vial transport medium  Result Value Ref Range Status   SARS Coronavirus 2 by RT PCR POSITIVE (A) NEGATIVE Final    Comment: RESULT CALLED TO, READ BACK BY AND VERIFIED WITH: LAMB, S RN 1709 05/20/20 JM (NOTE) SARS-CoV-2 target nucleic acids are DETECTED.  The SARS-CoV-2 RNA is generally detectable in upper respiratory specimens during the acute phase of infection. Positive results are indicative of the presence of the identified virus, but do not rule out bacterial infection or co-infection with other pathogens not detected by the test. Clinical correlation with patient history and other diagnostic information is  necessary to determine patient infection status. The expected result is Negative.  Fact Sheet for Patients: BloggerCourse.com  Fact Sheet for Healthcare Providers: SeriousBroker.it  This test is not yet approved or cleared by the [place] FDA and  has been authorized for detection and/or diagnosis of SARS-CoV-2 by FDA under an Emergency Use Authorization (EUA).  This EUA will remain in effect (meaning this test can be used)  for the duration of  the COVID-19 declaration under Section 564(b)(1) of the Act, 21 U.S.C. section 360bbb-3(b)(1), unless the authorization is terminated or revoked sooner.     Influenza A by PCR NEGATIVE NEGATIVE Final   Influenza B by PCR NEGATIVE NEGATIVE Final    Comment: (NOTE) The Xpert Xpress SARS-CoV-2/FLU/RSV plus assay is intended as an aid in the diagnosis of influenza from Nasopharyngeal swab specimens and should not be used as a sole basis for treatment. Nasal washings and aspirates are unacceptable for Xpert Xpress SARS-CoV-2/FLU/RSV testing.  Fact Sheet for Patients: BloggerCourse.com  Fact Sheet for Healthcare Providers: SeriousBroker.it  This test is not  yet approved or cleared by the [place] FDA and has been authorized for detection and/or diagnosis of SARS-CoV-2 by FDA under an Emergency Use Authorization (EUA). This EUA will remain in effect (meaning this test can be used) for the duration of the COVID-19 declaration under Section 564(b)(1) of the Act, 21 U.S.C. section 360bbb-3(b)(1), unless the authorization is terminated or revoked.  Performed at [facility], [address], [place], [place] [address]   Blood Culture (routine x 2)     Status: None (Preliminary result)   Collection Time: 05/20/20  2:59 PM   Specimen: BLOOD  Result Value Ref Range Status   Specimen Description   Final    BLOOD SITE NOT SPECIFIED Performed at [facility], [address] [place], [place] [address]    Special Requests   Final    BOTTLES DRAWN AEROBIC AND ANAEROBIC Blood Culture adequate volume Performed at [facility], [address] [place], [place] [address]    Culture   Final    NO GROWTH < 12 HOURS Performed at [facility], [address], [place], [place] [address]    Report Status PENDING  Incomplete  Blood Culture (routine x 2)     Status: None (Preliminary result)   Collection Time: 05/20/20  4:24 PM   Specimen: BLOOD RIGHT HAND  Result Value Ref Range Status   Specimen Description   Final    BLOOD RIGHT HAND Performed at [facility], [address], [place], [place] [address]    Special Requests   Final    BOTTLES DRAWN AEROBIC AND ANAEROBIC Blood Culture adequate volume Performed at [facility], [address] [place], [place] [address]    Culture   Final    NO GROWTH < 12 HOURS Performed at [facility], [address] [place], [place] [address]    Report Status PENDING  Incomplete     Radiology Studies: DG Chest Port 1 View  Result Date: 05/20/2020 CLINICAL DATA:  COVID EXAM: PORTABLE CHEST 1 VIEW COMPARISON:   None. FINDINGS: Multifocal patchy opacities in the right upper lobe and bilateral lower lobes. No pleural effusion or pneumothorax. The heart is normal in size. IMPRESSION: Multifocal pneumonia in this patient with known COVID. Electronically Signed   By: Charline Bills M.D.   On: 05/20/2020 14:12   Pamella Pert, MD, PhD [facility]  Between 7 am - 7 pm I am available, please contact me via Amion or Securechat  Between 7 pm - 7 am I am not available, please contact night coverage MD/APP via Amion

## 2020-05-21 NOTE — Significant Event (Signed)
Rapid Response Update Note   Reason for Call : Talked to [facility] RN about MEWS score in RED.  Patient given Tylenol prn @ 2016 for T-101.2,  HR 120-130's and RR 25-30's.  Plan of Care: Continue to monitor and assist RN needs on the floor.  Peter Minium, RN

## 2020-05-22 DIAGNOSIS — U071 COVID-19: Secondary | ICD-10-CM | POA: Diagnosis not present

## 2020-05-22 DIAGNOSIS — J9601 Acute respiratory failure with hypoxia: Secondary | ICD-10-CM | POA: Diagnosis not present

## 2020-05-22 DIAGNOSIS — E1165 Type 2 diabetes mellitus with hyperglycemia: Secondary | ICD-10-CM | POA: Diagnosis not present

## 2020-05-22 LAB — CBC WITH DIFFERENTIAL/PLATELET
Abs Immature Granulocytes: 0.1 10*3/uL — ABNORMAL HIGH (ref 0.00–0.07)
Basophils Absolute: 0 10*3/uL (ref 0.0–0.1)
Basophils Relative: 0 %
Eosinophils Absolute: 0 10*3/uL (ref 0.0–0.5)
Eosinophils Relative: 0 %
HCT: 37.6 % (ref 36.0–46.0)
Hemoglobin: 12.4 g/dL (ref 12.0–15.0)
Immature Granulocytes: 1 %
Lymphocytes Relative: 13 %
Lymphs Abs: 1.6 10*3/uL (ref 0.7–4.0)
MCH: 27.1 pg (ref 26.0–34.0)
MCHC: 33 g/dL (ref 30.0–36.0)
MCV: 82.3 fL (ref 80.0–100.0)
Monocytes Absolute: 0.8 10*3/uL (ref 0.1–1.0)
Monocytes Relative: 7 %
Neutro Abs: 9.8 10*3/uL — ABNORMAL HIGH (ref 1.7–7.7)
Neutrophils Relative %: 79 %
Platelets: 368 10*3/uL (ref 150–400)
RBC: 4.57 MIL/uL (ref 3.87–5.11)
RDW: 14.9 % (ref 11.5–15.5)
WBC: 12.4 10*3/uL — ABNORMAL HIGH (ref 4.0–10.5)
nRBC: 0 % (ref 0.0–0.2)

## 2020-05-22 LAB — GLUCOSE, CAPILLARY
Glucose-Capillary: 216 mg/dL — ABNORMAL HIGH (ref 70–99)
Glucose-Capillary: 224 mg/dL — ABNORMAL HIGH (ref 70–99)
Glucose-Capillary: 243 mg/dL — ABNORMAL HIGH (ref 70–99)
Glucose-Capillary: 255 mg/dL — ABNORMAL HIGH (ref 70–99)
Glucose-Capillary: 269 mg/dL — ABNORMAL HIGH (ref 70–99)
Glucose-Capillary: 270 mg/dL — ABNORMAL HIGH (ref 70–99)

## 2020-05-22 LAB — COMPREHENSIVE METABOLIC PANEL
ALT: 65 U/L — ABNORMAL HIGH (ref 0–44)
AST: 94 U/L — ABNORMAL HIGH (ref 15–41)
Albumin: 3.2 g/dL — ABNORMAL LOW (ref 3.5–5.0)
Alkaline Phosphatase: 66 U/L (ref 38–126)
Anion gap: 12 (ref 5–15)
BUN: 12 mg/dL (ref 6–20)
CO2: 25 mmol/L (ref 22–32)
Calcium: 8.4 mg/dL — ABNORMAL LOW (ref 8.9–10.3)
Chloride: 100 mmol/L (ref 98–111)
Creatinine, Ser: 0.97 mg/dL (ref 0.44–1.00)
GFR, Estimated: >60 mL/min (ref >60)
Glucose, Bld: 221 mg/dL — ABNORMAL HIGH (ref 70–99)
Potassium: 4.2 mmol/L (ref 3.5–5.1)
Sodium: 137 mmol/L (ref 135–145)
Total Bilirubin: 0.5 mg/dL (ref 0.3–1.2)
Total Protein: 8 g/dL (ref 6.5–8.1)

## 2020-05-22 LAB — FERRITIN: Ferritin: 561 ng/mL — ABNORMAL HIGH (ref 11–307)

## 2020-05-22 LAB — MAGNESIUM: Magnesium: 2.5 mg/dL — ABNORMAL HIGH (ref 1.7–2.4)

## 2020-05-22 LAB — D-DIMER, QUANTITATIVE: D-Dimer, Quant: 0.92 ug/mL-FEU — ABNORMAL HIGH (ref 0.00–0.50)

## 2020-05-22 LAB — C-REACTIVE PROTEIN: CRP: 17.3 mg/dL — ABNORMAL HIGH (ref <1.0)

## 2020-05-22 LAB — MRSA PCR SCREENING: MRSA by PCR: NEGATIVE

## 2020-05-22 LAB — PHOSPHORUS: Phosphorus: 3.3 mg/dL (ref 2.5–4.6)

## 2020-05-22 MED ORDER — SODIUM CHLORIDE 0.9 % IV SOLN
200.0000 mg | Freq: Once | INTRAVENOUS | Status: AC
  Administered 2020-05-22: 200 mg via INTRAVENOUS
  Filled 2020-05-22: qty 40

## 2020-05-22 MED ORDER — SODIUM CHLORIDE 0.9 % IV SOLN
100.0000 mg | Freq: Every day | INTRAVENOUS | Status: AC
  Administered 2020-05-23 – 2020-05-26 (×4): 100 mg via INTRAVENOUS
  Filled 2020-05-22 (×4): qty 20

## 2020-05-22 MED ORDER — CHLORHEXIDINE GLUCONATE CLOTH 2 % EX PADS
6.0000 | MEDICATED_PAD | Freq: Every day | CUTANEOUS | Status: DC
  Administered 2020-05-22 – 2020-05-31 (×10): 6 via TOPICAL

## 2020-05-22 MED ORDER — HYDROCOD POLST-CPM POLST ER 10-8 MG/5ML PO SUER
5.0000 mL | Freq: Two times a day (BID) | ORAL | Status: DC | PRN
  Administered 2020-05-22 – 2020-06-04 (×8): 5 mL via ORAL
  Filled 2020-05-22 (×9): qty 5

## 2020-05-22 MED ORDER — SODIUM CHLORIDE 0.9 % IV BOLUS
1000.0000 mL | Freq: Once | INTRAVENOUS | Status: AC
  Administered 2020-05-22: 1000 mL via INTRAVENOUS

## 2020-05-22 MED ORDER — KETOROLAC TROMETHAMINE 30 MG/ML IJ SOLN: 30.0000 mg | Freq: Once | INTRAMUSCULAR | Status: AC | PRN

## 2020-05-22 MED ORDER — METOPROLOL TARTRATE 25 MG PO TABS
25.0000 mg | ORAL_TABLET | Freq: Two times a day (BID) | ORAL | Status: DC
  Administered 2020-05-22 – 2020-05-26 (×10): 25 mg via ORAL
  Filled 2020-05-22 (×10): qty 1

## 2020-05-22 MED ORDER — ORAL CARE MOUTH RINSE
15.0000 mL | Freq: Two times a day (BID) | OROMUCOSAL | Status: DC
  Administered 2020-05-22 – 2020-06-11 (×38): 15 mL via OROMUCOSAL

## 2020-05-22 NOTE — Progress Notes (Signed)
Pt status update:  Pt transfer to higher level of care for increased O2 needs, heart rate, BP, and work of breathing.    Report called to ICU RN

## 2020-05-22 NOTE — Significant Event (Signed)
Rapid Response Event Note    Reason for Call : Increased oxygen demand, respirations, temperature and heart rate.  Called by Jaci Carrel RN to evaluate.  Initial Focused Assessment: Patient Alert and oriented person, place, time and place.  Patient taking short breathes, slight inspiratory wheezing and not deep breathing and was increase to 15L HFNC.  Interventions: MD paged and updated on patient status by Primary RN.  Incentive spirometry used and deep breathing with some teach back displayed by the patient.  Plan of Care: Continue to monitor.  Pure wick being implemented to decrease work of breathing when using the bathroom.  NS 1L bolus being given by IV.   Event Summary: Explained to patient concern for her being placed on a ventilator and tried to educate about the disease and Remdesivir since she has been refusing. RT informed may need some other therapies.  And MD being paged to see if CXR for this AM.    MD Notified:  by Jaci Carrel RN  Call Time: [phone] Arrival Time: 0036 End Time: 0135  Update after talking with MD, patient being placed on bipap and transferred to SD/ICU  Peter Minium, RN

## 2020-05-22 NOTE — Progress Notes (Signed)
PROGRESS NOTE  Diane Padilla KXF:[id number] DOB: 10-20-1985 DOA: 05/20/2020 PCP: [facility]   LOS: 2 days   Brief Narrative / Interim history: Diane Padilla is a 34 y.o. female with medical history significant of obesity, DM2, OSA, PCOS who was admitted to the hospital on 11/26 with acute hypoxic respiratory failure due to COVID-19 pneumonia.  She has been having symptoms for the past 9 days prior to hospitalization but over the last few days started experiencing shortness of breath and came to the hospital.  On admission she was found to be hypoxic requiring 6 L supplemental oxygen.  She did not receive the COVID-19 vaccine.  Subjective / 24h Interval events: Patient had increased oxygen requirements overnight, more tachypneic and tachycardic and was transferred to stepdown  States that she is feeling well overall.  Denies any significant shortness of breath but has not been out of bed.  No abdominal pain, no nausea or vomiting.  Assessment & Plan: Principal Problem Acute hypoxic respiratory failure due to COVID-19 pneumonia -Patient started on Solu-Medrol, continue -Unfortunately at admission she refused to have Actemra/baricitinib, refused Remdesivir -Eventually started on baricitinib 11/27 and this morning given transferred to stepdown agrees to remdesivir -On 11/27 she asked about monoclonal antibodies/ivermectin which are not indicated currently -Remains profoundly hypoxic, worsening since yesterday, currently on 15 L -Prone as able, incentive spirometry, early ambulation, sit in the chair  COVID-19 Labs  Recent Labs    05/20/20 1459 05/21/20 0438 05/22/20 0904  DDIMER 0.61* 0.53* 0.92*  FERRITIN 295 339*  --   LDH 302*  --   --   CRP 23.1* 24.6*  --     Lab Results  Component Value Date   SARSCOV2NAA POSITIVE (A) 05/20/2020   Active Problems Morbid obesity -Based on a BMI of 51, confers her greater risk for poor outcome in  the setting of Covid  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 albuterol  2 puff Inhalation Q4H  . baricitinib  4 mg Oral Daily  . Chlorhexidine Gluconate Cloth  6 each Topical Q0600  . enoxaparin (LOVENOX) injection  70 mg Subcutaneous QHS  . insulin aspart  0-9 Units Subcutaneous Q4H  . linagliptin  5 mg Oral Daily  . mouth rinse  15 mL Mouth Rinse BID  . methylPREDNISolone (SOLU-MEDROL) injection  80 mg Intravenous Q12H   Followed by  . [START ON 05/24/2020] predniSONE  50 mg Oral Daily  . sodium chloride flush  3 mL Intravenous Q12H  . sodium chloride flush  3 mL Intravenous Q12H   Continuous Infusions: . sodium chloride    . [START ON 05/23/2020] remdesivir 100 mg in NS 100 mL     PRN Meds:.sodium chloride, acetaminophen, guaiFENesin-dextromethorphan, HYDROcodone-acetaminophen, ketorolac, ondansetron **OR** ondansetron (ZOFRAN) IV, phenol, sodium chloride flush  Diet Orders (From admission, onward)    Start     Ordered   05/20/20 2129  Diet Carb Modified Fluid consistency: Thin; Room service appropriate? Yes  Diet effective now       Question Answer Comment  Diet-HS Snack? Nothing   Calorie Level Medium 1600-2000   Fluid consistency: Thin   Room service appropriate? Yes      05/20/20 2129           DVT prophylaxis: Lovenox  Code Status: Full Code  Family Communication: Full plan of care was discussed with the patient  Status is: Inpatient  Remains inpatient appropriate because:Inpatient level of care appropriate due to severity of illness  Dispo: The patient is from: Home              Anticipated d/c is to: Home              Anticipated d/c date is: > 3 days              Patient currently is not medically stable to d/c.   Consultants:  None   Procedures:  None    Microbiology  None   Antimicrobials: None     Objective: Vitals:   05/22/20 0409 05/22/20 0500 05/22/20 0600 05/22/20 0840  BP: (!) 152/75 (!) 174/74 (!) 178/88   Pulse: (!) 113 (!) 115 (!) 111   Resp: (!) 28 (!) 7 13   Temp:    100.1 F (37.8 C)  TempSrc:    Oral  SpO2: 94% 100% 95%   Weight:      Height:        Intake/Output Summary (Last 24 hours) at 05/22/2020 1007 Last data filed at 05/22/2020 0400 Gross per 24 hour  Intake 789.08 ml  Output --  Net 789.08 ml   Filed Weights   05/20/20 2118  Weight: (!) 139.2 kg    Examination:  Constitutional: In bed, lying on her side, appears uncomfortable and tachypneic Eyes: No scleral icterus ENMT: Moist membranes Neck: normal, supple Respiratory: Distant breath sounds due to body habitus, no wheezing, no crackles, increased respiratory effort, tachypneic Cardiovascular: Regular rate and rhythm, no murmurs, no significant edema Abdomen: Nondistended, bowel sounds positive Musculoskeletal: no clubbing / cyanosis.  Skin: No rashes seen Neurologic: No focal deficits Psychiatric: Normal judgment and insight.   Data Reviewed: I have independently reviewed following labs and imaging studies   CBC: Recent Labs  Lab 05/20/20 1459 05/21/20 0438 05/22/20 0904  WBC 7.4 7.3 12.4*  NEUTROABS 5.7 5.7 PENDING  HGB 12.5 12.5 12.4  HCT 36.6 37.3 37.6  MCV 81.2 81.3 82.3  PLT 289 322 368   Basic Metabolic Panel: Recent Labs  Lab 05/20/20 1459 05/21/20 0438 05/22/20 0904  NA 132* 136 137  K 3.8 4.1 4.2  CL 99 102 100  CO2 23 24 25   GLUCOSE 178* 216* 221*  BUN 7 9 12   CREATININE 0.85 0.80 0.97  CALCIUM 8.2* 8.5* 8.4*  MG  --  2.4 2.5*  PHOS  --  2.5 3.3   Liver Function Tests: Recent Labs  Lab 05/20/20 1459 05/21/20 0438 05/22/20 0904  AST 45* 44* 94*  ALT 32 37 65*  ALKPHOS 51 51 66  BILITOT 0.5 0.4 0.5  PROT 7.6 7.9 8.0  ALBUMIN 3.4* 3.3* 3.2*   Coagulation Profile: No results for input(s):  INR, PROTIME in the last 168 hours. HbA1C: Recent Labs    05/21/20 0438  HGBA1C 6.6*   CBG: Recent Labs  Lab 05/21/20 1559 05/21/20 1956 05/22/20 0013 05/22/20 0424 05/22/20 0750  GLUCAP 245* 249* 243* 216* 224*    Recent Results (from the past 240 hour(s))  Resp Panel by RT-PCR (Flu A&B, Covid) Nasopharyngeal Swab     Status: Abnormal   Collection Time: 05/20/20  2:59 PM   Specimen: Nasopharyngeal Swab; Nasopharyngeal(NP) swabs in vial transport medium  Result Value Ref Range Status   SARS Coronavirus 2 by RT PCR POSITIVE (A) NEGATIVE Final  Comment: RESULT CALLED TO, READ BACK BY AND VERIFIED WITH: LAMB, S RN 1709 05/20/20 JM (NOTE) SARS-CoV-2 target nucleic acids are DETECTED.  The SARS-CoV-2 RNA is generally detectable in upper respiratory specimens during the acute phase of infection. Positive results are indicative of the presence of the identified virus, but do not rule out bacterial infection or co-infection with other pathogens not detected by the test. Clinical correlation with patient history and other diagnostic information is necessary to determine patient infection status. The expected result is Negative.  Fact Sheet for Patients: BloggerCourse.com  Fact Sheet for Healthcare Providers: SeriousBroker.it  This test is not yet approved or cleared by the [place] FDA and  has been authorized for detection and/or diagnosis of SARS-CoV-2 by FDA under an Emergency Use Authorization (EUA).  This EUA will remain in effect (meaning this test can be used)  for the duration of  the COVID-19 declaration under Section 564(b)(1) of the Act, 21 U.S.C. section 360bbb-3(b)(1), unless the authorization is terminated or revoked sooner.     Influenza A by PCR NEGATIVE NEGATIVE Final   Influenza B by PCR NEGATIVE NEGATIVE Final    Comment: (NOTE) The Xpert Xpress SARS-CoV-2/FLU/RSV plus assay is intended as an  aid in the diagnosis of influenza from Nasopharyngeal swab specimens and should not be used as a sole basis for treatment. Nasal washings and aspirates are unacceptable for Xpert Xpress SARS-CoV-2/FLU/RSV testing.  Fact Sheet for Patients: BloggerCourse.com  Fact Sheet for Healthcare Providers: SeriousBroker.it  This test is not yet approved or cleared by the [place] FDA and has been authorized for detection and/or diagnosis of SARS-CoV-2 by FDA under an Emergency Use Authorization (EUA). This EUA will remain in effect (meaning this test can be used) for the duration of the COVID-19 declaration under Section 564(b)(1) of the Act, 21 U.S.C. section 360bbb-3(b)(1), unless the authorization is terminated or revoked.  Performed at [facility], [address], [place], [place] [address]   Blood Culture (routine x 2)     Status: None (Preliminary result)   Collection Time: 05/20/20  2:59 PM   Specimen: BLOOD  Result Value Ref Range Status   Specimen Description   Final    BLOOD SITE NOT SPECIFIED Performed at [facility], [address], [place], [place] [address]    Special Requests   Final    BOTTLES DRAWN AEROBIC AND ANAEROBIC Blood Culture adequate volume Performed at [facility], [address] [place], [place] [address]    Culture   Final    NO GROWTH 2 DAYS Performed at [facility], [address] [place], [place] [address]    Report Status PENDING  Incomplete  Blood Culture (routine x 2)     Status: None (Preliminary result)   Collection Time: 05/20/20  4:24 PM   Specimen: BLOOD RIGHT HAND  Result Value Ref Range Status   Specimen Description   Final    BLOOD RIGHT HAND Performed at [facility], [address], [place], [place] [address]    Special Requests   Final    BOTTLES DRAWN AEROBIC AND ANAEROBIC Blood Culture adequate  volume Performed at [facility], [address] [place], [place] [address]    Culture   Final    NO GROWTH 2 DAYS Performed at [facility], [address], [place], [place] [address]    Report Status PENDING  Incomplete  MRSA PCR Screening  Status: None   Collection Time: 05/22/20  3:32 AM   Specimen: Nasal Mucosa; Nasopharyngeal  Result Value Ref Range Status   MRSA by PCR NEGATIVE NEGATIVE Final    Comment:        The GeneXpert MRSA Assay (FDA approved for NASAL specimens only), is one component of a comprehensive MRSA colonization surveillance program. It is not intended to diagnose MRSA infection nor to guide or monitor treatment for MRSA infections. Performed at [facility], [address] [place], [place] [id number]      Radiology Studies: No results found. Pamella Pertostin Altan Kraai, MD, PhD [facility]  Between 7 am - 7 pm I am available, please contact me via Amion or Securechat  Between 7 pm - 7 am I am not available, please contact night coverage MD/APP via Amion

## 2020-05-23 DIAGNOSIS — J9600 Acute respiratory failure, unspecified whether with hypoxia or hypercapnia: Secondary | ICD-10-CM

## 2020-05-23 DIAGNOSIS — J9601 Acute respiratory failure with hypoxia: Secondary | ICD-10-CM | POA: Diagnosis not present

## 2020-05-23 DIAGNOSIS — U071 COVID-19: Principal | ICD-10-CM

## 2020-05-23 DIAGNOSIS — E1165 Type 2 diabetes mellitus with hyperglycemia: Secondary | ICD-10-CM | POA: Diagnosis not present

## 2020-05-23 LAB — CBC WITH DIFFERENTIAL/PLATELET
Abs Immature Granulocytes: 0.17 10*3/uL — ABNORMAL HIGH (ref 0.00–0.07)
Basophils Absolute: 0 10*3/uL (ref 0.0–0.1)
Basophils Relative: 0 %
Eosinophils Absolute: 0 10*3/uL (ref 0.0–0.5)
Eosinophils Relative: 0 %
HCT: 37.2 % (ref 36.0–46.0)
Hemoglobin: 12.4 g/dL (ref 12.0–15.0)
Immature Granulocytes: 1 %
Lymphocytes Relative: 9 %
Lymphs Abs: 1.2 10*3/uL (ref 0.7–4.0)
MCH: 27.3 pg (ref 26.0–34.0)
MCHC: 33.3 g/dL (ref 30.0–36.0)
MCV: 81.9 fL (ref 80.0–100.0)
Monocytes Absolute: 0.8 10*3/uL (ref 0.1–1.0)
Monocytes Relative: 6 %
Neutro Abs: 10.9 10*3/uL — ABNORMAL HIGH (ref 1.7–7.7)
Neutrophils Relative %: 84 %
Platelets: 365 10*3/uL (ref 150–400)
RBC: 4.54 MIL/uL (ref 3.87–5.11)
RDW: 15 % (ref 11.5–15.5)
WBC: 13.1 10*3/uL — ABNORMAL HIGH (ref 4.0–10.5)
nRBC: 0 % (ref 0.0–0.2)

## 2020-05-23 LAB — COMPREHENSIVE METABOLIC PANEL
ALT: 123 U/L — ABNORMAL HIGH (ref 0–44)
AST: 129 U/L — ABNORMAL HIGH (ref 15–41)
Albumin: 2.9 g/dL — ABNORMAL LOW (ref 3.5–5.0)
Alkaline Phosphatase: 76 U/L (ref 38–126)
Anion gap: 12 (ref 5–15)
BUN: 13 mg/dL (ref 6–20)
CO2: 24 mmol/L (ref 22–32)
Calcium: 8.5 mg/dL — ABNORMAL LOW (ref 8.9–10.3)
Chloride: 101 mmol/L (ref 98–111)
Creatinine, Ser: 0.78 mg/dL (ref 0.44–1.00)
GFR, Estimated: >60 mL/min (ref >60)
Glucose, Bld: 254 mg/dL — ABNORMAL HIGH (ref 70–99)
Potassium: 4.5 mmol/L (ref 3.5–5.1)
Sodium: 137 mmol/L (ref 135–145)
Total Bilirubin: 0.6 mg/dL (ref 0.3–1.2)
Total Protein: 7.9 g/dL (ref 6.5–8.1)

## 2020-05-23 LAB — GLUCOSE, CAPILLARY
Glucose-Capillary: 223 mg/dL — ABNORMAL HIGH (ref 70–99)
Glucose-Capillary: 229 mg/dL — ABNORMAL HIGH (ref 70–99)
Glucose-Capillary: 250 mg/dL — ABNORMAL HIGH (ref 70–99)
Glucose-Capillary: 261 mg/dL — ABNORMAL HIGH (ref 70–99)
Glucose-Capillary: 271 mg/dL — ABNORMAL HIGH (ref 70–99)
Glucose-Capillary: 272 mg/dL — ABNORMAL HIGH (ref 70–99)
Glucose-Capillary: 277 mg/dL — ABNORMAL HIGH (ref 70–99)

## 2020-05-23 LAB — MAGNESIUM: Magnesium: 2.9 mg/dL — ABNORMAL HIGH (ref 1.7–2.4)

## 2020-05-23 LAB — D-DIMER, QUANTITATIVE: D-Dimer, Quant: 1.93 ug/mL-FEU — ABNORMAL HIGH (ref 0.00–0.50)

## 2020-05-23 LAB — FERRITIN: Ferritin: 843 ng/mL — ABNORMAL HIGH (ref 11–307)

## 2020-05-23 LAB — PROCALCITONIN: Procalcitonin: 0.11 ng/mL

## 2020-05-23 LAB — C-REACTIVE PROTEIN: CRP: 19 mg/dL — ABNORMAL HIGH (ref <1.0)

## 2020-05-23 LAB — PHOSPHORUS: Phosphorus: 3.4 mg/dL (ref 2.5–4.6)

## 2020-05-23 MED ORDER — FUROSEMIDE 10 MG/ML IJ SOLN
20.0000 mg | Freq: Once | INTRAMUSCULAR | Status: AC
  Administered 2020-05-23: 20 mg via INTRAVENOUS
  Filled 2020-05-23: qty 2

## 2020-05-23 MED ORDER — INSULIN GLARGINE 100 UNIT/ML ~~LOC~~ SOLN
10.0000 [IU] | Freq: Every day | SUBCUTANEOUS | Status: DC
  Administered 2020-05-23: 10 [IU] via SUBCUTANEOUS
  Filled 2020-05-23 (×2): qty 0.1

## 2020-05-23 MED ORDER — METHYLPREDNISOLONE SODIUM SUCC 125 MG IJ SOLR
80.0000 mg | Freq: Two times a day (BID) | INTRAMUSCULAR | Status: DC
  Administered 2020-05-23 – 2020-05-26 (×6): 80 mg via INTRAVENOUS
  Filled 2020-05-23 (×6): qty 2

## 2020-05-23 MED ORDER — HYDRALAZINE HCL 25 MG PO TABS
25.0000 mg | ORAL_TABLET | Freq: Three times a day (TID) | ORAL | Status: DC
  Administered 2020-05-23 – 2020-05-31 (×25): 25 mg via ORAL
  Filled 2020-05-23 (×25): qty 1

## 2020-05-23 NOTE — Progress Notes (Signed)
PROGRESS NOTE  Diane Padilla OZD:[id number] DOB: Oct 02, 1985 DOA: 05/20/2020 PCP: [facility]   LOS: 3 days   Brief Narrative / Interim history: Diane Padilla is a 34 y.o. female with medical history significant of obesity, DM2, OSA, PCOS who was admitted to the hospital on 11/26 with acute hypoxic respiratory failure due to COVID-19 pneumonia.  She has been having symptoms for the past 9 days prior to hospitalization but over the last few days started experiencing shortness of breath and came to the hospital.  On admission she was found to be hypoxic requiring 6 L supplemental oxygen.  She did not receive the COVID-19 vaccine.  Subjective / 24h Interval events: Complains of shortness of breath.  She is laying on her side.  No chest pain, no abdominal pain, no nausea or vomiting  Assessment & Plan: Principal Problem Acute hypoxic respiratory failure due to COVID-19 pneumonia -Unfortunately at admission she refused to have Actemra/baricitinib, refused Remdesivir -She was initially started on steroids.  As she was getting worse, she agreed to baricitinib 24 hours after admission and eventually to remdesivir 48 hours after admission.  Continue -She remains profoundly hypoxic, quite tachypneic, continue to keep in stepdown, very high risk for intubation given delay in presentation and treatment -Prone as able, incentive spirometry, early ambulation, sit in the chair  COVID-19 Labs  Recent Labs    05/20/20 1459 05/20/20 1459 05/21/20 0438 05/22/20 0904 05/23/20 0330  DDIMER 0.61*   < > 0.53* 0.92* 1.93*  FERRITIN 295   < > 339* 561* 843*  LDH 302*  --   --   --   --   CRP 23.1*   < > 24.6* 17.3* 19.0*   < > = values in this interval not displayed.    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  Start Lantus  CBG (last 3)  Recent Labs    05/23/20 0025 05/23/20 0431 05/23/20 0738  GLUCAP 277* 250* 229*    Scheduled Meds: . albuterol  2 puff Inhalation Q4H  . baricitinib  4 mg Oral Daily  . Chlorhexidine Gluconate Cloth  6 each Topical Q0600  . enoxaparin (LOVENOX) injection  70 mg Subcutaneous QHS  . insulin aspart  0-9 Units Subcutaneous Q4H  . linagliptin  5 mg Oral Daily  . mouth rinse  15 mL Mouth Rinse BID  . methylPREDNISolone (SOLU-MEDROL) injection  80 mg Intravenous Q12H   Followed by  . [START ON 05/24/2020] predniSONE  50 mg Oral Daily  . metoprolol tartrate  25 mg Oral BID  . sodium chloride flush  3 mL Intravenous Q12H  . sodium chloride flush  3 mL Intravenous Q12H   Continuous Infusions: . sodium chloride    . remdesivir 100 mg in NS 100 mL     PRN Meds:.sodium chloride, acetaminophen, chlorpheniramine-HYDROcodone, guaiFENesin-dextromethorphan, HYDROcodone-acetaminophen, ketorolac, ondansetron **OR** ondansetron (ZOFRAN) IV, phenol, sodium chloride flush  Diet Orders (From admission, onward)    Start     Ordered   05/20/20 2129  Diet Carb Modified Fluid consistency: Thin; Room service appropriate? Yes  Diet effective now       Question Answer Comment  Diet-HS Snack? Nothing   Calorie Level Medium 1600-2000  Fluid consistency: Thin   Room service appropriate? Yes      05/20/20 2129           DVT prophylaxis: Lovenox    Code Status: Full Code  Family Communication: Husband over the phone  Status is: Inpatient  Remains inpatient appropriate because:Inpatient level of care appropriate due to severity of illness  Dispo: The patient is from: Home              Anticipated d/c is to: Home              Anticipated d/c date is: > 3 days              Patient currently is not medically stable to d/c.   Consultants:  None    Procedures:  None   Microbiology  None   Antimicrobials: None     Objective: Vitals:   05/23/20 0409 05/23/20 0700 05/23/20 0800 05/23/20 0900  BP: (!) 168/74 (!) 166/72  (!) 166/96  Pulse: (!) 103 (!) 101 (!) 105 99  Resp: (!) 24 (!) 48  (!) 39  Temp:   100.3 F (37.9 C)   TempSrc:   Axillary   SpO2: (!) 89% 91% (!) 86% (!) 86%  Weight:      Height:        Intake/Output Summary (Last 24 hours) at 05/23/2020 9675 Last data filed at 05/23/2020 0800 Gross per 24 hour  Intake 250 ml  Output 650 ml  Net -400 ml   Filed Weights   05/20/20 2118  Weight: (!) 139.2 kg    Examination:  Constitutional: In bed, lying on her side, very tachypneic Eyes: No scleral icterus ENMT: Moist mucous membranes Neck: normal, supple Respiratory: Distant breath sounds due to body habitus, no wheezing or crackles, increased respiratory effort Cardiovascular: Regular rate and rhythm, no murmurs, no edema Abdomen: Nondistended, bowel sounds positive Musculoskeletal: no clubbing / cyanosis.  Skin: No rashes seen Neurologic: Nonfocal, equal strength  Data Reviewed: I have independently reviewed following labs and imaging studies   CBC: Recent Labs  Lab 05/20/20 1459 05/21/20 0438 05/22/20 0904 05/23/20 0330  WBC 7.4 7.3 12.4* 13.1*  NEUTROABS 5.7 5.7 9.8* 10.9*  HGB 12.5 12.5 12.4 12.4  HCT 36.6 37.3 37.6 37.2  MCV 81.2 81.3 82.3 81.9  PLT 289 322 368 365   Basic Metabolic Panel: Recent Labs  Lab 05/20/20 1459 05/21/20 0438 05/22/20 0904 05/23/20 0330  NA 132* 136 137 137  K 3.8 4.1 4.2 4.5  CL 99 102 100 101  CO2 23 24 25 24   GLUCOSE 178* 216* 221* 254*  BUN 7 9 12 13   CREATININE 0.85 0.80 0.97 0.78  CALCIUM 8.2* 8.5* 8.4* 8.5*  MG  --  2.4 2.5* 2.9*  PHOS  --  2.5 3.3 3.4   Liver Function Tests: Recent Labs  Lab 05/20/20 1459 05/21/20 0438 05/22/20 0904 05/23/20 0330  AST 45* 44* 94* 129*  ALT 32 37 65* 123*  ALKPHOS 51 51 66 76  BILITOT 0.5 0.4 0.5  0.6  PROT 7.6 7.9 8.0 7.9  ALBUMIN 3.4* 3.3* 3.2* 2.9*   Coagulation Profile: No results for input(s): INR, PROTIME in the last 168 hours. HbA1C: Recent Labs    05/21/20 0438  HGBA1C 6.6*   CBG: Recent Labs  Lab 05/22/20 1518 05/22/20 1957 05/23/20 0025 05/23/20 0431 05/23/20 0738  GLUCAP 255* 270* 277* 250* 229*    Recent Results (from the past 240 hour(s))  Resp  Panel by RT-PCR (Flu A&B, Covid) Nasopharyngeal Swab     Status: Abnormal   Collection Time: 05/20/20  2:59 PM   Specimen: Nasopharyngeal Swab; Nasopharyngeal(NP) swabs in vial transport medium  Result Value Ref Range Status   SARS Coronavirus 2 by RT PCR POSITIVE (A) NEGATIVE Final    Comment: RESULT CALLED TO, READ BACK BY AND VERIFIED WITH: LAMB, S RN 1709 05/20/20 JM (NOTE) SARS-CoV-2 target nucleic acids are DETECTED.  The SARS-CoV-2 RNA is generally detectable in upper respiratory specimens during the acute phase of infection. Positive results are indicative of the presence of the identified virus, but do not rule out bacterial infection or co-infection with other pathogens not detected by the test. Clinical correlation with patient history and other diagnostic information is necessary to determine patient infection status. The expected result is Negative.  Fact Sheet for Patients: BloggerCourse.com  Fact Sheet for Healthcare Providers: SeriousBroker.it  This test is not yet approved or cleared by the [place] FDA and  has been authorized for detection and/or diagnosis of SARS-CoV-2 by FDA under an Emergency Use Authorization (EUA).  This EUA will remain in effect (meaning this test can be used)  for the duration of  the COVID-19 declaration under Section 564(b)(1) of the Act, 21 U.S.C. section 360bbb-3(b)(1), unless the authorization is terminated or revoked sooner.     Influenza A by PCR NEGATIVE NEGATIVE Final   Influenza B by PCR  NEGATIVE NEGATIVE Final    Comment: (NOTE) The Xpert Xpress SARS-CoV-2/FLU/RSV plus assay is intended as an aid in the diagnosis of influenza from Nasopharyngeal swab specimens and should not be used as a sole basis for treatment. Nasal washings and aspirates are unacceptable for Xpert Xpress SARS-CoV-2/FLU/RSV testing.  Fact Sheet for Patients: BloggerCourse.com  Fact Sheet for Healthcare Providers: SeriousBroker.it  This test is not yet approved or cleared by the [place] FDA and has been authorized for detection and/or diagnosis of SARS-CoV-2 by FDA under an Emergency Use Authorization (EUA). This EUA will remain in effect (meaning this test can be used) for the duration of the COVID-19 declaration under Section 564(b)(1) of the Act, 21 U.S.C. section 360bbb-3(b)(1), unless the authorization is terminated or revoked.  Performed at [facility], [address], [place], [place] [address]   Blood Culture (routine x 2)     Status: None (Preliminary result)   Collection Time: 05/20/20  2:59 PM   Specimen: BLOOD  Result Value Ref Range Status   Specimen Description   Final    BLOOD SITE NOT SPECIFIED Performed at [facility], [address] [place], [place] [address]    Special Requests   Final    BOTTLES DRAWN AEROBIC AND ANAEROBIC Blood Culture adequate volume Performed at [facility], [address] [place], [place] [address]    Culture   Final    NO GROWTH 2 DAYS Performed at [facility], [address], [place], [place] [address]    Report Status PENDING  Incomplete  Blood Culture (routine x 2)     Status: None (Preliminary result)   Collection Time: 05/20/20  4:24 PM   Specimen: BLOOD RIGHT HAND  Result Value Ref Range Status   Specimen Description   Final    BLOOD RIGHT HAND Performed at [facility], [address] [place], [place]  [address]    Special Requests   Final    BOTTLES DRAWN AEROBIC AND ANAEROBIC Blood Culture adequate  volume Performed at [facility], [address] [place], [place] [address]    Culture   Final    NO GROWTH 2 DAYS Performed at [facility], [address] [place], [place] [address]    Report Status PENDING  Incomplete  MRSA PCR Screening     Status: None   Collection Time: 05/22/20  3:32 AM   Specimen: Nasal Mucosa; Nasopharyngeal  Result Value Ref Range Status   MRSA by PCR NEGATIVE NEGATIVE Final    Comment:        The GeneXpert MRSA Assay (FDA approved for NASAL specimens only), is one component of a comprehensive MRSA colonization surveillance program. It is not intended to diagnose MRSA infection nor to guide or monitor treatment for MRSA infections. Performed at [facility], [address] [place], [place] [address]      Radiology Studies: No results found. Pamella Pertostin Alezandra Egli, MD, PhD [facility]  Between 7 am - 7 pm I am available, please contact me via Amion or Securechat  Between 7 pm - 7 am I am not available, please contact night coverage MD/APP via Amion

## 2020-05-23 NOTE — Progress Notes (Signed)
Pt hesitant to try CPAP due to O2 demands.  Pt states she would like to remain on HHFNC/NRB at this time.  RT to monitor and assess as needed.

## 2020-05-23 NOTE — Consult Note (Signed)
NAME:  Diane Padilla, MRN:  [id number], DOB:  09/15/1985, LOS: 3 ADMISSION DATE:  05/20/2020, CONSULTATION DATE:  11/29 REFERRING MD:  Dr. Lafe Garin, CHIEF COMPLAINT:  COVID 19 PNA   Brief History   34 year old female COVID-19 positive with hypoxemic respiratory failure near maxxed on heated high flow prompting pulmonary consult.   History of present illness     Past Medical History  34 year old female with past medical history as below, which is significant for diabetes and OSA on CPAP. She was in her usual state of health until approximately 11/18 when she developed a dry hacking cough. She tested positive for COVID-19. Symptoms progressed to include loss of taste and smell, fever, chills, and shortness of breath. Shortness of breath being the symptom that caused her to eventually present to [facility] emergency department on 11/26. She was found be hypoxemic down to 84% on room air and was started on supplemental oxygen. She was admitted to the hospitalist team. She was started on steroids, but did have reservations about remdesivir baricitinib leading her to refuses medications at that time. As her symptoms continued to progressed and her oxygen requirements have worsened she has agreed to have these initiated. On 11/29 her oxygen requirements have increased to 30 L high flow at 100% in addition to nonrebreather. PCCM consulted for refractory hypoxemia.  Significant Hospital Events   11/26 admit 11/29 started on heated high flow  Consults:  PCCM  Procedures:    Significant Diagnostic Tests:    Micro Data:  BCx2 11/26 > Sputum 11/26 >  Antimicrobials:    Interim history/subjective:    Objective   Blood pressure (!) 165/89, pulse 98, temperature (!) 100.8 F (38.2 C), temperature source Axillary, resp. rate (!) 39, height 5\' 5"  (1.651 m), weight (!) 139.2 kg, last menstrual period 05/07/2020, SpO2 (!) 87 %.    FiO2 (%):  [100 %] 100 %   Intake/Output Summary (Last 24  hours) at 05/23/2020 1402 Last data filed at 05/23/2020 1200 Gross per 24 hour  Intake 109.55 ml  Output 650 ml  Net -540.45 ml   Filed Weights   05/20/20 2118  Weight: (!) 139.2 kg    Examination: General: morbidly obese female in mild respiratory distress HENT: [place]/AT, PERRL, No JVD Lungs: Bibasilar course. Mild distress. RR 40s. Able to speak sentences but is visibly winded after.  Cardiovascular: RRR, no MRG Abdomen: Soft, non-tender Extremities: Trace edema.  Neuro: Alert, oriented, non-focal.   Resolved Hospital Problem list     Assessment & Plan:   Acute hypoxemic respiratory failure COVID-19 pneumonia OSA on CPAP - HHFNC and NRB to keep sats > 85% resting - There is some room to increase LPM of HHFNC - Continue remdesivir and baricitinib as you are.  - may benefit from gentle diuresis as she is net positive for admission and does have some trace edema, will give 20mg  lasix.  - I have encouraged her to employ prone positioning and incentive spirometry - QHS CPAP with FiO2 100% - She is aware that should her condition continue to worsen she will require intubation.  Best practice (evaluated daily)   Diet: NPO Pain/Anxiety/Delirium protocol (if indicated): NA VAP protocol (if indicated): NA DVT prophylaxis: Per primary GI prophylaxis: Per primary Glucose control: Per primary Mobility: Bedrest last date of multidisciplinary goals of care discussion 11/29 Family and staff present Myself, patient Summary of discussion Full Code Follow up goals of care discussion due 12/6 Code Status: Full  Disposition: ICU  Labs   CBC: Recent Labs  Lab 05/20/20 1459 05/21/20 0438 05/22/20 0904 05/23/20 0330  WBC 7.4 7.3 12.4* 13.1*  NEUTROABS 5.7 5.7 9.8* 10.9*  HGB 12.5 12.5 12.4 12.4  HCT 36.6 37.3 37.6 37.2  MCV 81.2 81.3 82.3 81.9  PLT 289 322 368 365    Basic Metabolic Panel: Recent Labs  Lab 05/20/20 1459 05/21/20 0438 05/22/20 0904 05/23/20 0330  NA  132* 136 137 137  K 3.8 4.1 4.2 4.5  CL 99 102 100 101  CO2 23 24 25 24   GLUCOSE 178* 216* 221* 254*  BUN 7 9 12 13   CREATININE 0.85 0.80 0.97 0.78  CALCIUM 8.2* 8.5* 8.4* 8.5*  MG  --  2.4 2.5* 2.9*  PHOS  --  2.5 3.3 3.4   GFR: Estimated Creatinine Clearance: 138 mL/min (by C-G formula based on SCr of 0.78 mg/dL). Recent Labs  Lab 05/20/20 1459 05/21/20 0438 05/22/20 0904 05/23/20 0330  PROCALCITON 0.20  --   --   --   WBC 7.4 7.3 12.4* 13.1*  LATICACIDVEN 0.8  --   --   --     Liver Function Tests: Recent Labs  Lab 05/20/20 1459 05/21/20 0438 05/22/20 0904 05/23/20 0330  AST 45* 44* 94* 129*  ALT 32 37 65* 123*  ALKPHOS 51 51 66 76  BILITOT 0.5 0.4 0.5 0.6  PROT 7.6 7.9 8.0 7.9  ALBUMIN 3.4* 3.3* 3.2* 2.9*   No results for input(s): LIPASE, AMYLASE in the last 168 hours. No results for input(s): AMMONIA in the last 168 hours.  ABG No results found for: PHART, PCO2ART, PO2ART, HCO3, TCO2, ACIDBASEDEF, O2SAT   Coagulation Profile: No results for input(s): INR, PROTIME in the last 168 hours.  Cardiac Enzymes: No results for input(s): CKTOTAL, CKMB, CKMBINDEX, TROPONINI in the last 168 hours.  HbA1C: Hgb A1c MFr Bld  Date/Time Value Ref Range Status  05/21/2020 04:38 AM 6.6 (H) 4.8 - 5.6 % Final    Comment:    (NOTE) Pre diabetes:          5.7%-6.4%  Diabetes:              >6.4%  Glycemic control for   <7.0% adults with diabetes   01/19/2019 12:19 PM 6.1 (H) 4.8 - 5.6 % Final    Comment:             Prediabetes: 5.7 - 6.4          Diabetes: >6.4          Glycemic control for adults with diabetes: <7.0     CBG: Recent Labs  Lab 05/22/20 1957 05/23/20 0025 05/23/20 0431 05/23/20 0738 05/23/20 1151  GLUCAP 270* 277* 250* 229* 223*    Review of Systems:   Bolds are positive  Constitutional: weight loss, gain, night sweats, Fevers, chills, fatigue .  HEENT: headaches, Sore throat, sneezing, nasal congestion, post nasal drip, Difficulty  swallowing, Tooth/dental problems, visual complaints visual changes, ear ache CV:  chest pain, radiates:, Orthopnea, PND, swelling in lower extremities, dizziness, palpitations, syncope.  GI  heartburn, indigestion, abdominal pain, nausea, vomiting, diarrhea, change in bowel habits, loss of appetite, bloody stools.  Resp: cough, productive: , hemoptysis, dyspnea, chest pain, pleuritic.  Skin: rash or itching or icterus GU: dysuria, change in color of urine, urgency or frequency. flank pain, hematuria  MS: joint pain or swelling. decreased range of motion  Psych: change in mood or affect. depression or anxiety Neuro:  difficulty with speech, weakness, numbness, ataxia    Past Medical History  She,  has a past medical history of Allergy, Back pain, Depression, Diabetes mellitus without complication (HCC) (07/20/13), Dyspnea, GERD (gastroesophageal reflux disease), PCOS (polycystic ovarian syndrome), Sleep apnea, and Vitamin D deficiency.   Surgical History    Past Surgical History:  Procedure Laterality Date   WISDOM TOOTH EXTRACTION       Social History   reports that she has never smoked. She has never used smokeless tobacco. She reports that she does not drink alcohol and does not use drugs.   Family History   Her family history includes Breast cancer (age of onset: 68) in her cousin; Cancer in her mother; Diabetes in her mother; Drug abuse in her father and mother; Hyperlipidemia in her maternal grandmother and mother; Hypertension in her maternal grandmother and mother; Kidney disease in her mother; Obesity in her mother; Sleep apnea in her mother; Stroke in her mother.   Allergies No Known Allergies   Home Medications  Prior to Admission medications   Medication Sig Start Date End Date Taking? Authorizing Provider  acetaminophen (TYLENOL) 500 MG tablet Take 500-1,000 mg by mouth every 6 (six) hours as needed for mild pain or headache.   Yes [provider]  ascorbic acid  (VITAMIN C) 250 MG tablet Take 250 mg by mouth daily.   Yes [provider]  Cholecalciferol (VITAMIN D3) 125 MCG (5000 UT) CAPS Take 5,000 Units by mouth daily.   Yes [provider]  diphenhydramine-acetaminophen (TYLENOL PM) 25-500 MG TABS tablet Take 1 tablet by mouth at bedtime as needed (for sleep).    Yes [provider]  ELDERBERRY PO Take 1 tablet by mouth daily.    Yes [provider]  hydrocortisone 2.5 % ointment Apply 1 application topically 2 (two) times daily as needed (for itching).  02/16/20  Yes [provider]  ketoconazole (NIZORAL) 2 % cream Apply 1 application topically 2 (two) times daily as needed for irritation.  02/16/20  Yes [provider]  metFORMIN (GLUCOPHAGE-XR) 500 MG 24 hr tablet Take 500 mg by mouth in the morning, at noon, and at bedtime.    Yes [provider]  vitamin B-12 (CYANOCOBALAMIN) 1000 MCG tablet Take 1,000 mcg by mouth daily.   Yes [provider]  glucose blood test strip as directed.  01/26/20 01/25/21  [provider]  medroxyPROGESTERone (PROVERA) 5 MG tablet Take for 5 days now and every month if no spontaneous cycle Patient not taking: Reported on 05/20/2020 02/09/20   Romualdo Bolk, MD     Critical care time:      Joneen Roach, AGACNP-BC [facility]  See Amion for personal pager PCCM on call pager [phone]  05/23/2020 2:37 PM

## 2020-05-23 NOTE — TOC Initial Note (Signed)
Transition of Care [facility]) - Initial/Assessment Note    Patient Details  Name: Diane Padilla MRN: [id number] Date of Birth: 1986-05-07  Transition of Care [facility]) CM/SW Contact:    Golda Acre, RN Phone Number: 05/23/2020, 9:19 AM  Clinical Narrative:                 34 y.o.femalewith medical history significant of obesity, DM2, OSA, PCOS who was admitted to the hospital on 11/26 with acute hypoxic respiratory failure due to COVID-19 pneumonia.  She has been having symptoms for the past 9 days prior to hospitalization but over the last few days started experiencing shortness of breath and came to the hospital.  On admission she was found to be hypoxic requiring 6 L supplemental oxygen.  She did not receive the COVID-19 vaccine. PLAN: to return to home Progression: temp 101.2, hfnrb at 15l/min, iv solu medrol, iv remdesivir through 120321, d.dimer-1.93, crp-19.0, WBC 13.1. Expected Discharge Plan: Home/Self Care Barriers to Discharge: No Barriers Identified   Patient Goals and CMS Choice Patient states their goals for this hospitalization and ongoing recovery are:: to go home      Expected Discharge Plan and Services Expected Discharge Plan: Home/Self Care   Discharge Planning Services: CM Consult   Living arrangements for the past 2 months: Apartment                                      Prior Living Arrangements/Services Living arrangements for the past 2 months: Apartment Lives with:: Spouse Patient language and need for interpreter reviewed:: Yes Do you feel safe going back to the place where you live?: Yes      Need for Family Participation in Patient Care: Yes (Comment) (spouse) Care giver support system in place?: Yes (comment) (spouse)   Criminal Activity/Legal Involvement Pertinent to Current Situation/Hospitalization: No - Comment as needed  Activities of Daily Living Home Assistive Devices/Equipment: None ADL Screening (condition at time of  admission) Patient's cognitive ability adequate to safely complete daily activities?: Yes Is the patient deaf or have difficulty hearing?: No Does the patient have difficulty seeing, even when wearing glasses/contacts?: No Does the patient have difficulty concentrating, remembering, or making decisions?: No Patient able to express need for assistance with ADLs?: Yes Does the patient have difficulty dressing or bathing?: No Independently performs ADLs?: Yes (appropriate for developmental age) Does the patient have difficulty walking or climbing stairs?: Yes (gets sob) Weakness of Legs: Both Weakness of Arms/Hands: Both  Permission Sought/Granted                  Emotional Assessment Appearance:: Appears stated age Attitude/Demeanor/Rapport: Engaged Affect (typically observed): Calm Orientation: : Oriented to Place, Oriented to Self, Oriented to  Time, Oriented to Situation Alcohol / Substance Use: Not Applicable Psych Involvement: No (comment)  Admission diagnosis:  Acute respiratory failure with hypoxia (HCC) [J96.01] Pneumonia due to COVID-19 virus [U07.1, J12.82] COVID-19 [U07.1] Patient Active Problem List   Diagnosis Date Noted  . Pneumonia due to COVID-19 virus 05/20/2020  . Acute respiratory failure due to COVID-19 (HCC) 05/20/2020  . Hyponatremia 05/20/2020  . Sleep apnea 12/14/2019  . Type 2 diabetes mellitus with hyperglycemia, without long-term current use of insulin (HCC) 12/09/2018  . Class 3 severe obesity with serious comorbidity and body mass index (BMI) of 50.0 to 59.9 in adult (HCC) 12/09/2018  . PCOS (polycystic ovarian syndrome) 01/09/2013  .  Oligomenorrhea 12/12/2012   PCP:  [facility]:   [facility] [address] ([address]), [place] - [address] [place]) [place] [address] Phone: [phone] Fax: [phone]     Social Determinants of Health (SDOH) Interventions     Readmission Risk Interventions No flowsheet data found.

## 2020-05-24 ENCOUNTER — Inpatient Hospital Stay (HOSPITAL_COMMUNITY): Payer: BC Managed Care – PPO

## 2020-05-24 DIAGNOSIS — R791 Abnormal coagulation profile: Secondary | ICD-10-CM

## 2020-05-24 DIAGNOSIS — J9600 Acute respiratory failure, unspecified whether with hypoxia or hypercapnia: Secondary | ICD-10-CM | POA: Diagnosis not present

## 2020-05-24 DIAGNOSIS — J9601 Acute respiratory failure with hypoxia: Secondary | ICD-10-CM | POA: Diagnosis not present

## 2020-05-24 DIAGNOSIS — E1165 Type 2 diabetes mellitus with hyperglycemia: Secondary | ICD-10-CM | POA: Diagnosis not present

## 2020-05-24 DIAGNOSIS — U071 COVID-19: Secondary | ICD-10-CM | POA: Diagnosis not present

## 2020-05-24 LAB — MAGNESIUM: Magnesium: 3.1 mg/dL — ABNORMAL HIGH (ref 1.7–2.4)

## 2020-05-24 LAB — COMPREHENSIVE METABOLIC PANEL
ALT: 99 U/L — ABNORMAL HIGH (ref 0–44)
AST: 70 U/L — ABNORMAL HIGH (ref 15–41)
Albumin: 2.8 g/dL — ABNORMAL LOW (ref 3.5–5.0)
Alkaline Phosphatase: 83 U/L (ref 38–126)
Anion gap: 11 (ref 5–15)
BUN: 18 mg/dL (ref 6–20)
CO2: 25 mmol/L (ref 22–32)
Calcium: 8.4 mg/dL — ABNORMAL LOW (ref 8.9–10.3)
Chloride: 103 mmol/L (ref 98–111)
Creatinine, Ser: 0.77 mg/dL (ref 0.44–1.00)
GFR, Estimated: >60 mL/min (ref >60)
Glucose, Bld: 270 mg/dL — ABNORMAL HIGH (ref 70–99)
Potassium: 4.8 mmol/L (ref 3.5–5.1)
Sodium: 139 mmol/L (ref 135–145)
Total Bilirubin: 0.4 mg/dL (ref 0.3–1.2)
Total Protein: 7.3 g/dL (ref 6.5–8.1)

## 2020-05-24 LAB — CBC WITH DIFFERENTIAL/PLATELET
Abs Immature Granulocytes: 0.31 10*3/uL — ABNORMAL HIGH (ref 0.00–0.07)
Basophils Absolute: 0.1 10*3/uL (ref 0.0–0.1)
Basophils Relative: 1 %
Eosinophils Absolute: 0 10*3/uL (ref 0.0–0.5)
Eosinophils Relative: 0 %
HCT: 37.8 % (ref 36.0–46.0)
Hemoglobin: 12.1 g/dL (ref 12.0–15.0)
Immature Granulocytes: 2 %
Lymphocytes Relative: 10 %
Lymphs Abs: 1.6 10*3/uL (ref 0.7–4.0)
MCH: 27 pg (ref 26.0–34.0)
MCHC: 32 g/dL (ref 30.0–36.0)
MCV: 84.4 fL (ref 80.0–100.0)
Monocytes Absolute: 0.9 10*3/uL (ref 0.1–1.0)
Monocytes Relative: 6 %
Neutro Abs: 13 10*3/uL — ABNORMAL HIGH (ref 1.7–7.7)
Neutrophils Relative %: 81 %
Platelets: 269 10*3/uL (ref 150–400)
RBC: 4.48 MIL/uL (ref 3.87–5.11)
RDW: 14.9 % (ref 11.5–15.5)
WBC: 15.8 10*3/uL — ABNORMAL HIGH (ref 4.0–10.5)
nRBC: 0 % (ref 0.0–0.2)

## 2020-05-24 LAB — GLUCOSE, CAPILLARY
Glucose-Capillary: 258 mg/dL — ABNORMAL HIGH (ref 70–99)
Glucose-Capillary: 225 mg/dL — ABNORMAL HIGH (ref 70–99)
Glucose-Capillary: 307 mg/dL — ABNORMAL HIGH (ref 70–99)
Glucose-Capillary: 282 mg/dL — ABNORMAL HIGH (ref 70–99)

## 2020-05-24 LAB — D-DIMER, QUANTITATIVE: D-Dimer, Quant: >20 ug/mL-FEU — ABNORMAL HIGH (ref 0.00–0.50)

## 2020-05-24 LAB — PHOSPHORUS: Phosphorus: 3.7 mg/dL (ref 2.5–4.6)

## 2020-05-24 LAB — C-REACTIVE PROTEIN: CRP: 14.7 mg/dL — ABNORMAL HIGH (ref <1.0)

## 2020-05-24 LAB — FERRITIN: Ferritin: 979 ng/mL — ABNORMAL HIGH (ref 11–307)

## 2020-05-24 MED ORDER — MELATONIN 5 MG PO TABS
5.0000 mg | ORAL_TABLET | Freq: Every day | ORAL | Status: DC
  Administered 2020-05-24 – 2020-05-30 (×4): 5 mg via ORAL
  Filled 2020-05-24 (×17): qty 1

## 2020-05-24 MED ORDER — INSULIN GLARGINE 100 UNIT/ML ~~LOC~~ SOLN
14.0000 [IU] | Freq: Every day | SUBCUTANEOUS | Status: DC
  Administered 2020-05-24: 14 [IU] via SUBCUTANEOUS
  Filled 2020-05-24 (×2): qty 0.14

## 2020-05-24 MED ORDER — ENOXAPARIN SODIUM 150 MG/ML ~~LOC~~ SOLN
140.0000 mg | Freq: Two times a day (BID) | SUBCUTANEOUS | Status: DC
  Administered 2020-05-24 – 2020-05-29 (×11): 140 mg via SUBCUTANEOUS
  Filled 2020-05-24 (×12): qty 0.94

## 2020-05-24 MED ORDER — FUROSEMIDE 10 MG/ML IJ SOLN
40.0000 mg | Freq: Once | INTRAMUSCULAR | Status: AC
  Administered 2020-05-24: 40 mg via INTRAVENOUS
  Filled 2020-05-24: qty 4

## 2020-05-24 NOTE — Progress Notes (Signed)
Bilateral lower extremity venous duplex has been completed. Preliminary results can be found in CV Proc through chart review.  Results were given to Dr. Elvera Lennox.  05/24/20 10:05 AM Olen Cordial RVT

## 2020-05-24 NOTE — Progress Notes (Signed)
NAME:  Diane Padilla, MRN:  [id number], DOB:  Mar 14, 1986, LOS: 4 ADMISSION DATE:  05/20/2020, CONSULTATION DATE:  11/29 REFERRING MD:  Dr. Lafe Garin, CHIEF COMPLAINT:  COVID 19 PNA   Brief History   34 year old female COVID-19 positive with hypoxemic respiratory failure near maxxed on heated high flow prompting pulmonary consult.   History of present illness     Past Medical History  34 year old female with past medical history as below, which is significant for diabetes and OSA on CPAP. She was in her usual state of health until approximately 11/18 when she developed a dry hacking cough. She tested positive for COVID-19. Symptoms progressed to include loss of taste and smell, fever, chills, and shortness of breath. Shortness of breath being the symptom that caused her to eventually present to [facility] long emergency department on 11/26. She was found be hypoxemic down to 84% on room air and was started on supplemental oxygen. She was admitted to the hospitalist team. She was started on steroids, but did have reservations about remdesivir baricitinib leading her to refuses medications at that time. As her symptoms continued to progressed and her oxygen requirements have worsened she has agreed to have these initiated. On 11/29 her oxygen requirements have increased to 30 L high flow at 100% in addition to nonrebreather. PCCM consulted for refractory hypoxemia.  Significant Hospital Events   11/26 admit 11/29 started on heated high flow  Consults:  PCCM  Procedures:    Significant Diagnostic Tests:    Micro Data:  BCx2 11/26 > Sputum 11/26 >  Antimicrobials:    Interim history/subjective:  HHFNC increased to 40LPM and 100%.  Opted to not used CPAP last night, due to oxygen concerns.  Was able to self prone some, with improvement in O2 sats.   Objective   Blood pressure (!) 144/70, pulse 88, temperature 99.5 F (37.5 C), temperature source Oral, resp. rate (!) 52, height 5\' 5"   (1.651 m), weight (!) 139.2 kg, last menstrual period 05/07/2020, SpO2 95 %.    FiO2 (%):  [100 %] 100 %   Intake/Output Summary (Last 24 hours) at 05/24/2020 0854 Last data filed at 05/24/2020 0600 Gross per 24 hour  Intake 118.26 ml  Output 1450 ml  Net -1331.74 ml   Filed Weights   05/20/20 2118  Weight: (!) 139.2 kg    Examination:  General: morbidly obese female resting in bed.  HENT: [place]/AT, PERRL, No JVD Lungs: Coarse, throughout. Breathing comfortably on HHFNC and NRB.  Cardiovascular: RRR, no MRG Abdomen: Soft, non-tender Extremities: Trace pretibial edema.   Neuro: Alert, oriented, non-focal.   Resolved Hospital Problem list     Assessment & Plan:   Acute hypoxemic respiratory failure COVID-19 pneumonia OSA on CPAP - HHFNC and NRB to keep sats > 85% resting - There is some room to increase LPM of HHFNC - Continue remdesivir and baricitinib as you are.  - Goal negative fluid balance.  - Prone positioning, IS very important. I have told her this.  - She is aware that should her condition continue to worsen she will require intubation.  D. Dimer elevation: sharp rise 11/29 >11/30 - agree with therapeutic dose lovenox.   Best practice (evaluated daily)   Diet: NPO Pain/Anxiety/Delirium protocol (if indicated): NA VAP protocol (if indicated): NA DVT prophylaxis: Per primary GI prophylaxis: Per primary Glucose control: Per primary Mobility: Bedrest last date of multidisciplinary goals of care discussion 11/29 Family and staff present Myself, patient Summary  of discussion Full Code Follow up goals of care discussion due 12/6 Code Status: Full Disposition: ICU  Labs   CBC: Recent Labs  Lab 05/20/20 1459 05/21/20 0438 05/22/20 0904 05/23/20 0330 05/24/20 0326  WBC 7.4 7.3 12.4* 13.1* 15.8*  NEUTROABS 5.7 5.7 9.8* 10.9* 13.0*  HGB 12.5 12.5 12.4 12.4 12.1  HCT 36.6 37.3 37.6 37.2 37.8  MCV 81.2 81.3 82.3 81.9 84.4  PLT 289 322 368 365 269     Basic Metabolic Panel: Recent Labs  Lab 05/20/20 1459 05/21/20 0438 05/22/20 0904 05/23/20 0330 05/24/20 0326  NA 132* 136 137 137 139  K 3.8 4.1 4.2 4.5 4.8  CL 99 102 100 101 103  CO2 23 24 25 24 25   GLUCOSE 178* 216* 221* 254* 270*  BUN 7 9 12 13 18   CREATININE 0.85 0.80 0.97 0.78 0.77  CALCIUM 8.2* 8.5* 8.4* 8.5* 8.4*  MG  --  2.4 2.5* 2.9* 3.1*  PHOS  --  2.5 3.3 3.4 3.7   GFR: Estimated Creatinine Clearance: 138 mL/min (by C-G formula based on SCr of 0.77 mg/dL). Recent Labs  Lab 05/20/20 1459 05/20/20 1459 05/21/20 0438 05/22/20 0904 05/23/20 0330 05/24/20 0326  PROCALCITON 0.20  --   --   --  0.11  --   WBC 7.4   < > 7.3 12.4* 13.1* 15.8*  LATICACIDVEN 0.8  --   --   --   --   --    < > = values in this interval not displayed.    Liver Function Tests: Recent Labs  Lab 05/20/20 1459 05/21/20 0438 05/22/20 0904 05/23/20 0330 05/24/20 0326  AST 45* 44* 94* 129* 70*  ALT 32 37 65* 123* 99*  ALKPHOS 51 51 66 76 83  BILITOT 0.5 0.4 0.5 0.6 0.4  PROT 7.6 7.9 8.0 7.9 7.3  ALBUMIN 3.4* 3.3* 3.2* 2.9* 2.8*   No results for input(s): LIPASE, AMYLASE in the last 168 hours. No results for input(s): AMMONIA in the last 168 hours.  ABG No results found for: PHART, PCO2ART, PO2ART, HCO3, TCO2, ACIDBASEDEF, O2SAT   Coagulation Profile: No results for input(s): INR, PROTIME in the last 168 hours.  Cardiac Enzymes: No results for input(s): CKTOTAL, CKMB, CKMBINDEX, TROPONINI in the last 168 hours.  HbA1C: Hgb A1c MFr Bld  Date/Time Value Ref Range Status  05/21/2020 04:38 AM 6.6 (H) 4.8 - 5.6 % Final    Comment:    (NOTE) Pre diabetes:          5.7%-6.4%  Diabetes:              >6.4%  Glycemic control for   <7.0% adults with diabetes   01/19/2019 12:19 PM 6.1 (H) 4.8 - 5.6 % Final    Comment:             Prediabetes: 5.7 - 6.4          Diabetes: >6.4          Glycemic control for adults with diabetes: <7.0     CBG: Recent Labs  Lab  05/23/20 1624 05/23/20 2007 05/23/20 2332 05/24/20 0325 05/24/20 0812  GLUCAP 272* 271* 261* 258* 225*     Critical care time:     Patient updated in person 11/30  05/26/20, AGACNP-BC [facility]  See Amion for personal pager PCCM on call pager [phone]  05/24/2020 8:54 AM

## 2020-05-24 NOTE — Progress Notes (Addendum)
PROGRESS NOTE  Diane Padilla PJA:[id number] DOB: March 16, 1986 DOA: 05/20/2020 PCP: [facility]   LOS: 4 days   Brief Narrative / Interim history: Diane Padilla is a 34 y.o. female with medical history significant of obesity, DM2, OSA, PCOS who was admitted to the hospital on 11/26 with acute hypoxic respiratory failure due to COVID-19 pneumonia.  She has been having symptoms for the past 9 days prior to hospitalization but over the last few days started experiencing shortness of breath and came to the hospital.  On admission she was found to be hypoxic requiring 6 L supplemental oxygen.  She did not receive the COVID-19 vaccine.  Subjective / 24h Interval events: States that she is feeling a little bit better today.  She is laying on her left side.  No chest pain, no abdominal pain, no nausea or vomiting.  Denies calf pain  Assessment & Plan: Principal Problem Acute hypoxic respiratory failure due to COVID-19 pneumonia -Unfortunately at admission she refused to have Actemra/baricitinib, refused Remdesivir -She was initially started on steroids.  As she was getting worse, she agreed to baricitinib 24 hours after admission and eventually to remdesivir 48 hours after admission.  Continue those -Remains profoundly hypoxic, PCCM consulted and following. -OK with mild hypoxemia, goal at rest is > 85% SaO2, with movement ideally > 75% -encouraged the patient to sit up in chair in the daytime use I-S and flutter valve for pulmonary toiletry and then prone in bed when at night. -D-dimer with significant increase today.  She is too unstable to get a CT angiogram, lower extremity Dopplers obtained and showed a DVT.  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  Started on Lantus, increased dose today  CBG (last 3)  Recent Labs    05/23/20 2332 05/24/20 0325 05/24/20 0812  GLUCAP 261* 258* 225*    Scheduled Meds: . albuterol  2 puff Inhalation Q4H  . baricitinib  4 mg Oral Daily  . Chlorhexidine Gluconate Cloth  6 each Topical Q0600  . enoxaparin (LOVENOX) injection  140 mg Subcutaneous Q12H  . hydrALAZINE  25 mg Oral Q8H  . insulin aspart  0-9 Units Subcutaneous Q4H  . insulin glargine  10 Units Subcutaneous Daily  . linagliptin  5 mg Oral Daily  . mouth rinse  15 mL Mouth Rinse BID  . melatonin  5 mg Oral QHS  . methylPREDNISolone (SOLU-MEDROL) injection  80 mg Intravenous Q12H  . metoprolol tartrate  25 mg Oral BID  . sodium chloride flush  3 mL Intravenous Q12H  . sodium chloride flush  3 mL Intravenous Q12H   Continuous Infusions: . sodium chloride Stopped (05/23/20 1316)  . remdesivir 100 mg in NS 100 mL Stopped (05/23/20 1038)   PRN Meds:.sodium chloride, acetaminophen, chlorpheniramine-HYDROcodone, guaiFENesin-dextromethorphan, HYDROcodone-acetaminophen, ondansetron **OR** ondansetron (ZOFRAN) IV, phenol, sodium chloride flush  Diet Orders (From  admission, onward)    Start     Ordered   05/20/20 2129  Diet Carb Modified Fluid consistency: Thin; Room service appropriate? Yes  Diet effective now       Question Answer Comment  Diet-HS Snack? Nothing   Calorie Level Medium 1600-2000   Fluid consistency: Thin   Room service appropriate? Yes      05/20/20 2129         DVT prophylaxis: Lovenox    Code Status: Full Code  Family Communication: Husband   Concepcion LivingJames Wade, [phone] [id number]  Status is: Inpatient  Remains inpatient appropriate because:Inpatient level of care appropriate due to severity of illness  Dispo: The patient is from: Home              Anticipated d/c is to: Home              Anticipated d/c date is: > 3 days              Patient currently is not medically stable to d/c.   Consultants:  None   Procedures:  None   Microbiology  None   Antimicrobials: None     Objective: Vitals:   05/24/20 0700 05/24/20 0800 05/24/20 0808 05/24/20 0900  BP: (!) 144/70 (!) 160/65  (!) 167/72  Pulse: 88 95  (!) 104  Resp: (!) 52 (!) 33  (!) 51  Temp:   100.2 F (37.9 C)   TempSrc:   Axillary   SpO2: 96% 93% 95% (!) 84%  Weight:      Height:        Intake/Output Summary (Last 24 hours) at 05/24/2020 [id number] Last data filed at 05/24/2020 0900 Gross per 24 hour  Intake 118.26 ml  Output 1450 ml  Net -1331.74 ml   Filed Weights   05/20/20 2118  Weight: (!) 139.2 kg    Examination:  Constitutional: In bed, lying on her side, very tachypneic Eyes: No icterus ENMT: Moist mucous membranes Neck: normal, supple Respiratory: Distant breath sounds due to body habitus, no wheezing, no crackles, tachypneic Cardiovascular: Regular rate and rhythm, no murmurs Abdomen: nondistended, bowel sounds positive Musculoskeletal: no clubbing / cyanosis.  Skin: No rashes seen Neurologic: No focal deficits  Data Reviewed: I have independently reviewed following labs and imaging studies   CBC: Recent Labs  Lab 05/20/20 1459 05/21/20 0438 05/22/20 0904 05/23/20 0330 05/24/20 0326  WBC 7.4 7.3 12.4* 13.1* 15.8*  NEUTROABS 5.7 5.7 9.8* 10.9* 13.0*  HGB 12.5 12.5 12.4 12.4 12.1  HCT 36.6 37.3 37.6 37.2 37.8  MCV 81.2 81.3 82.3 81.9 84.4  PLT 289 322 368 365 269   Basic Metabolic Panel: Recent Labs  Lab 05/20/20 1459 05/21/20 0438 05/22/20 0904 05/23/20 0330 05/24/20 0326  NA 132* 136 137 137 139  K 3.8 4.1 4.2 4.5 4.8  CL  99 102 100 101 103  CO2 23 24 25 24 25   GLUCOSE 178* 216* 221* 254* 270*  BUN 7 9 12 13 18   CREATININE 0.85 0.80 0.97 0.78 0.77  CALCIUM 8.2* 8.5* 8.4* 8.5* 8.4*  MG  --  2.4 2.5* 2.9* 3.1*  PHOS  --  2.5 3.3 3.4 3.7   Liver Function Tests: Recent Labs  Lab 05/20/20 1459 05/21/20 0438 05/22/20 0904 05/23/20 0330 05/24/20 0326  AST 45* 44* 94* 129* 70*  ALT 32 37 65* 123* 99*  ALKPHOS 51 51 66 76 83  BILITOT 0.5 0.4 0.5 0.6 0.4  PROT 7.6 7.9 8.0 7.9 7.3  ALBUMIN 3.4* 3.3* 3.2* 2.9* 2.8*   Coagulation Profile: No results for input(s): INR, PROTIME in the last 168 hours. HbA1C: No results for input(s): HGBA1C in the last 72 hours. CBG: Recent Labs  Lab 05/23/20 1624 05/23/20 2007 05/23/20 2332 05/24/20 0325 05/24/20 0812  GLUCAP 272* 271* 261* 258* 225*    Recent Results (from the past 240 hour(s))  Resp Panel by RT-PCR (Flu A&B, Covid) Nasopharyngeal Swab     Status: Abnormal   Collection Time: 05/20/20  2:59 PM   Specimen: Nasopharyngeal Swab; Nasopharyngeal(NP) swabs in vial transport medium  Result Value Ref Range Status   SARS Coronavirus 2 by RT PCR POSITIVE (A) NEGATIVE Final    Comment: RESULT CALLED TO, READ BACK BY AND VERIFIED WITH: LAMB, S RN 1709 05/20/20 JM (NOTE) SARS-CoV-2 target nucleic acids are DETECTED.  The SARS-CoV-2 RNA is generally detectable in upper respiratory specimens during the acute phase of infection. Positive results are indicative of the presence of the identified virus, but do not rule out bacterial infection or co-infection with other pathogens not detected by the test. Clinical correlation with patient history and other diagnostic information is necessary to determine patient infection status. The expected result is Negative.  Fact Sheet for Patients: BloggerCourse.com  Fact Sheet for Healthcare Providers: SeriousBroker.it  This test is not yet approved or cleared by the  [place] FDA and  has been authorized for detection and/or diagnosis of SARS-CoV-2 by FDA under an Emergency Use Authorization (EUA).  This EUA will remain in effect (meaning this test can be used)  for the duration of  the COVID-19 declaration under Section 564(b)(1) of the Act, 21 U.S.C. section 360bbb-3(b)(1), unless the authorization is terminated or revoked sooner.     Influenza A by PCR NEGATIVE NEGATIVE Final   Influenza B by PCR NEGATIVE NEGATIVE Final    Comment: (NOTE) The Xpert Xpress SARS-CoV-2/FLU/RSV plus assay is intended as an aid in the diagnosis of influenza from Nasopharyngeal swab specimens and should not be used as a sole basis for treatment. Nasal washings and aspirates are unacceptable for Xpert Xpress SARS-CoV-2/FLU/RSV testing.  Fact Sheet for Patients: BloggerCourse.com  Fact Sheet for Healthcare Providers: SeriousBroker.it  This test is not yet approved or cleared by the [place] FDA and has been authorized for detection and/or diagnosis of SARS-CoV-2 by FDA under an Emergency Use Authorization (EUA). This EUA will remain in effect (meaning this test can be used) for the duration of the COVID-19 declaration under Section 564(b)(1) of the Act, 21 U.S.C. section 360bbb-3(b)(1), unless the authorization is terminated or revoked.  Performed at [facility], [address] [place], [place] [address]   Blood Culture (routine x 2)     Status: None (Preliminary result)   Collection Time: 05/20/20  2:59 PM   Specimen: BLOOD  Result Value Ref Range Status   Specimen Description   Final    BLOOD SITE NOT SPECIFIED Performed at [facility], [address] [place], [place] [address]    Special Requests   Final    BOTTLES DRAWN AEROBIC AND ANAEROBIC Blood Culture adequate volume Performed at [facility], [address] [place], [place] [address]     Culture   Final    NO GROWTH 4 DAYS Performed at [facility], [address] [place], [place] [address]    Report Status PENDING  Incomplete  Blood Culture (routine x 2)     Status: None (Preliminary  result)   Collection Time: 05/20/20  4:24 PM   Specimen: BLOOD RIGHT HAND  Result Value Ref Range Status   Specimen Description   Final    BLOOD RIGHT HAND Performed at [facility], [address] [place], [place] [address]    Special Requests   Final    BOTTLES DRAWN AEROBIC AND ANAEROBIC Blood Culture adequate volume Performed at [facility], [address], [place], [place] [address]    Culture   Final    NO GROWTH 4 DAYS Performed at [facility], [address], [place], [place] [address]    Report Status PENDING  Incomplete  MRSA PCR Screening     Status: None   Collection Time: 05/22/20  3:32 AM   Specimen: Nasal Mucosa; Nasopharyngeal  Result Value Ref Range Status   MRSA by PCR NEGATIVE NEGATIVE Final    Comment:        The GeneXpert MRSA Assay (FDA approved for NASAL specimens only), is one component of a comprehensive MRSA colonization surveillance program. It is not intended to diagnose MRSA infection nor to guide or monitor treatment for MRSA infections. Performed at [facility], [address], [place], [place] [address]      Radiology Studies: No results found. Pamella Pert, MD, PhD [facility]  Between 7 am - 7 pm I am available, please contact me via Amion or Securechat  Between 7 pm - 7 am I am not available, please contact night coverage MD/APP via Amion

## 2020-05-24 NOTE — Progress Notes (Signed)
ANTICOAGULATION CONSULT NOTE - Initial Consult  Pharmacy Consult for enoxaparin Indication: pulmonary embolus  No Known Allergies  Patient Measurements: Height: 5\' 5"  (165.1 cm) Weight: (!) 139.2 kg (306 lb 14.1 oz) IBW/kg (Calculated) : 57   Vital Signs: Temp: 99.7 F (37.6 C) (11/29 2100) BP: 166/80 (11/30 0515) Pulse Rate: 92 (11/30 0500)  Labs: Recent Labs    05/22/20 0904 05/22/20 0904 05/23/20 0330 05/24/20 0326  HGB 12.4   < > 12.4 12.1  HCT 37.6  --  37.2 37.8  PLT 368  --  365 269  CREATININE 0.97  --  0.78 0.77   < > = values in this interval not displayed.    Estimated Creatinine Clearance: 138 mL/min (by C-G formula based on SCr of 0.77 mg/dL).   Medical History: Past Medical History:  Diagnosis Date  . Allergy   . Back pain   . Depression   . Diabetes mellitus without complication (HCC) 07/20/13   no meds at this time  . Dyspnea   . GERD (gastroesophageal reflux disease)   . PCOS (polycystic ovarian syndrome)   . Sleep apnea   . Vitamin D deficiency      Assessment: 34 y.o. woman unvaccinated to covid 19 who has had worsening tachypnea and hypoxemia over the course of her hospital stay.  Pharmacy consulted to dose enoxaparin for high suspicion of DVT/PE.  Last dose of 0.5mg /kg enoxaparin at 2200 on 11/29   Goal of Therapy:  Anti-Xa level 0.6-1 units/ml 4hrs after LMWH dose given Monitor platelets by anticoagulation protocol:   Plan:  Enoxaparin 1gm/kg ( 140mg ) SQ q12h Follow renal function  Anti-Xa levels as needed  12/29 RPh 05/24/2020, 6:18 AM

## 2020-05-25 DIAGNOSIS — E1165 Type 2 diabetes mellitus with hyperglycemia: Secondary | ICD-10-CM | POA: Diagnosis not present

## 2020-05-25 DIAGNOSIS — E871 Hypo-osmolality and hyponatremia: Secondary | ICD-10-CM | POA: Diagnosis not present

## 2020-05-25 DIAGNOSIS — I824Y3 Acute embolism and thrombosis of unspecified deep veins of proximal lower extremity, bilateral: Secondary | ICD-10-CM | POA: Diagnosis not present

## 2020-05-25 DIAGNOSIS — J9600 Acute respiratory failure, unspecified whether with hypoxia or hypercapnia: Secondary | ICD-10-CM | POA: Diagnosis not present

## 2020-05-25 DIAGNOSIS — U071 COVID-19: Secondary | ICD-10-CM | POA: Diagnosis not present

## 2020-05-25 LAB — CBC WITH DIFFERENTIAL/PLATELET
Abs Immature Granulocytes: 0.37 10*3/uL — ABNORMAL HIGH (ref 0.00–0.07)
Basophils Absolute: 0.1 10*3/uL (ref 0.0–0.1)
Basophils Relative: 0 %
Eosinophils Absolute: 0 10*3/uL (ref 0.0–0.5)
Eosinophils Relative: 0 %
HCT: 39.3 % (ref 36.0–46.0)
Hemoglobin: 12.8 g/dL (ref 12.0–15.0)
Immature Granulocytes: 2 %
Lymphocytes Relative: 8 %
Lymphs Abs: 1.3 10*3/uL (ref 0.7–4.0)
MCH: 26.8 pg (ref 26.0–34.0)
MCHC: 32.6 g/dL (ref 30.0–36.0)
MCV: 82.4 fL (ref 80.0–100.0)
Monocytes Absolute: 0.7 10*3/uL (ref 0.1–1.0)
Monocytes Relative: 4 %
Neutro Abs: 14.1 10*3/uL — ABNORMAL HIGH (ref 1.7–7.7)
Neutrophils Relative %: 86 %
Platelets: 319 10*3/uL (ref 150–400)
RBC: 4.77 MIL/uL (ref 3.87–5.11)
RDW: 14.5 % (ref 11.5–15.5)
WBC: 16.5 10*3/uL — ABNORMAL HIGH (ref 4.0–10.5)
nRBC: 0 % (ref 0.0–0.2)

## 2020-05-25 LAB — CULTURE, BLOOD (ROUTINE X 2)
Culture: NO GROWTH
Culture: NO GROWTH
Special Requests: ADEQUATE
Special Requests: ADEQUATE

## 2020-05-25 LAB — GLUCOSE, CAPILLARY
Glucose-Capillary: 247 mg/dL — ABNORMAL HIGH (ref 70–99)
Glucose-Capillary: 216 mg/dL — ABNORMAL HIGH (ref 70–99)
Glucose-Capillary: 183 mg/dL — ABNORMAL HIGH (ref 70–99)
Glucose-Capillary: 276 mg/dL — ABNORMAL HIGH (ref 70–99)
Glucose-Capillary: 266 mg/dL — ABNORMAL HIGH (ref 70–99)
Glucose-Capillary: 160 mg/dL — ABNORMAL HIGH (ref 70–99)
Glucose-Capillary: 238 mg/dL — ABNORMAL HIGH (ref 70–99)

## 2020-05-25 LAB — COMPREHENSIVE METABOLIC PANEL
ALT: 79 U/L — ABNORMAL HIGH (ref 0–44)
AST: 41 U/L (ref 15–41)
Albumin: 2.9 g/dL — ABNORMAL LOW (ref 3.5–5.0)
Alkaline Phosphatase: 96 U/L (ref 38–126)
Anion gap: 9 (ref 5–15)
BUN: 22 mg/dL — ABNORMAL HIGH (ref 6–20)
CO2: 28 mmol/L (ref 22–32)
Calcium: 8.5 mg/dL — ABNORMAL LOW (ref 8.9–10.3)
Chloride: 100 mmol/L (ref 98–111)
Creatinine, Ser: 0.66 mg/dL (ref 0.44–1.00)
GFR, Estimated: >60 mL/min (ref >60)
Glucose, Bld: 235 mg/dL — ABNORMAL HIGH (ref 70–99)
Potassium: 4.7 mmol/L (ref 3.5–5.1)
Sodium: 137 mmol/L (ref 135–145)
Total Bilirubin: 0.8 mg/dL (ref 0.3–1.2)
Total Protein: 7.5 g/dL (ref 6.5–8.1)

## 2020-05-25 LAB — MAGNESIUM: Magnesium: 2.8 mg/dL — ABNORMAL HIGH (ref 1.7–2.4)

## 2020-05-25 LAB — D-DIMER, QUANTITATIVE: D-Dimer, Quant: 19.78 ug/mL-FEU — ABNORMAL HIGH (ref 0.00–0.50)

## 2020-05-25 LAB — C-REACTIVE PROTEIN: CRP: 8.1 mg/dL — ABNORMAL HIGH (ref <1.0)

## 2020-05-25 LAB — FERRITIN: Ferritin: 543 ng/mL — ABNORMAL HIGH (ref 11–307)

## 2020-05-25 LAB — PHOSPHORUS: Phosphorus: 4.1 mg/dL (ref 2.5–4.6)

## 2020-05-25 MED ORDER — INSULIN ASPART 100 UNIT/ML ~~LOC~~ SOLN
0.0000 [IU] | Freq: Three times a day (TID) | SUBCUTANEOUS | Status: DC
  Administered 2020-05-25: 11 [IU] via SUBCUTANEOUS
  Administered 2020-05-25: 4 [IU] via SUBCUTANEOUS
  Administered 2020-05-25: 11 [IU] via SUBCUTANEOUS
  Administered 2020-05-26 – 2020-05-27 (×4): 7 [IU] via SUBCUTANEOUS
  Administered 2020-05-27: 11 [IU] via SUBCUTANEOUS
  Administered 2020-05-27: 4 [IU] via SUBCUTANEOUS
  Administered 2020-05-28: 11 [IU] via SUBCUTANEOUS
  Administered 2020-05-28: 4 [IU] via SUBCUTANEOUS
  Administered 2020-05-28: 7 [IU] via SUBCUTANEOUS
  Administered 2020-05-29: 20 [IU] via SUBCUTANEOUS
  Administered 2020-05-29: 4 [IU] via SUBCUTANEOUS
  Administered 2020-05-29: 11 [IU] via SUBCUTANEOUS
  Administered 2020-05-30: 4 [IU] via SUBCUTANEOUS
  Administered 2020-05-30: 7 [IU] via SUBCUTANEOUS
  Administered 2020-05-30: 15 [IU] via SUBCUTANEOUS
  Administered 2020-05-31: 11 [IU] via SUBCUTANEOUS
  Administered 2020-05-31 (×2): 4 [IU] via SUBCUTANEOUS
  Administered 2020-06-01: 7 [IU] via SUBCUTANEOUS
  Administered 2020-06-01: 4 [IU] via SUBCUTANEOUS
  Administered 2020-06-01: 11 [IU] via SUBCUTANEOUS
  Administered 2020-06-02: 7 [IU] via SUBCUTANEOUS
  Administered 2020-06-02: 4 [IU] via SUBCUTANEOUS
  Administered 2020-06-03: 3 [IU] via SUBCUTANEOUS
  Administered 2020-06-03: 7 [IU] via SUBCUTANEOUS
  Administered 2020-06-03 – 2020-06-04 (×2): 4 [IU] via SUBCUTANEOUS
  Administered 2020-06-04: 7 [IU] via SUBCUTANEOUS
  Administered 2020-06-04: 3 [IU] via SUBCUTANEOUS
  Administered 2020-06-05: 4 [IU] via SUBCUTANEOUS
  Administered 2020-06-05 – 2020-06-07 (×5): 3 [IU] via SUBCUTANEOUS
  Administered 2020-06-08: 4 [IU] via SUBCUTANEOUS
  Administered 2020-06-08: 3 [IU] via SUBCUTANEOUS
  Administered 2020-06-08 – 2020-06-09 (×2): 4 [IU] via SUBCUTANEOUS
  Administered 2020-06-09 (×2): 3 [IU] via SUBCUTANEOUS
  Administered 2020-06-10: 4 [IU] via SUBCUTANEOUS
  Administered 2020-06-11: 3 [IU] via SUBCUTANEOUS

## 2020-05-25 MED ORDER — ADULT MULTIVITAMIN W/MINERALS CH
1.0000 | ORAL_TABLET | Freq: Every day | ORAL | Status: DC
  Administered 2020-05-25 – 2020-06-11 (×18): 1 via ORAL
  Filled 2020-05-25 (×18): qty 1

## 2020-05-25 MED ORDER — PROSOURCE PLUS PO LIQD
30.0000 mL | Freq: Three times a day (TID) | ORAL | Status: DC
  Administered 2020-05-25 – 2020-06-10 (×44): 30 mL via ORAL
  Filled 2020-05-25 (×48): qty 30

## 2020-05-25 MED ORDER — INSULIN GLARGINE 100 UNIT/ML ~~LOC~~ SOLN
18.0000 [IU] | Freq: Every day | SUBCUTANEOUS | Status: DC
  Administered 2020-05-25: 18 [IU] via SUBCUTANEOUS
  Filled 2020-05-25 (×2): qty 0.18

## 2020-05-25 MED ORDER — INSULIN ASPART 100 UNIT/ML ~~LOC~~ SOLN
0.0000 [IU] | Freq: Every day | SUBCUTANEOUS | Status: DC
  Administered 2020-05-26 – 2020-05-28 (×3): 2 [IU] via SUBCUTANEOUS
  Administered 2020-05-29: 3 [IU] via SUBCUTANEOUS
  Administered 2020-05-30: 2 [IU] via SUBCUTANEOUS

## 2020-05-25 NOTE — Progress Notes (Signed)
Initial Nutrition Assessment  RD working remotely.  DOCUMENTATION CODES:   Morbid obesity  INTERVENTION:  - will order order 30 ml Prosource Plus TID, each supplement provides 100 kcal and 15 grams protein.  - will order [employer] with whole milk with breakfast and dinner trays. - will order 1 tablet multivitamin with minerals/day. - highly recommend liberalizing diet from Carb Modified to Regular.    NUTRITION DIAGNOSIS:   Increased nutrient needs related to acute illness, catabolic illness (COVID-19 infection) as evidenced by estimated needs  GOAL:   Patient will meet greater than or equal to 90% of their needs  MONITOR:   PO intake, Supplement acceptance, Labs, Weight trends  REASON FOR ASSESSMENT:   Other (Comment) (RN report of poor appetite)  ASSESSMENT:   34 year-old female with medical history of DM, PCOS, GERD, vitamin D deficiency, depression and OSA on CPAP. She was in her usual state of health until 11/18 when she developed dry, hacking cough and was found to be COVID-19 positive. Symptoms progressed to include loss of taste and smell, fever, chills, and shortness of breath. She presented to the ED on 11/26 due to shortness of breath and was found to be 84% on room air. On 11/29 she progressed to require 30 L via HFNC and NRB.  RD is working off site/not on [employer] today so unable to see patient in person. RN alerted RD to patient yesterday d/t patient with poor appetite and intakes.   The only documented meal intake was 75% of breakfast on 11/27 (~475 kcal, 15 grams protein).   Patient was on a Regular diet for a few hours on 11/26 before being changed to Carb Modified. Highly recommend liberalizing back to Regular d/t poor appetite and intakes, restrictive diet does not provide adequate kcal and protein for catabolic illness, anticipate CBGs to be high d/t steroids and catabolic nature of COVID.   Weight on 11/26 was 307 lb; no weight since  that date. No information documented in the edema section of flow sheet. Weight on 04/06/20 was 322 lb which indicates 15 lb weight loss (4.6% body weight) in ~5 weeks which is not significant for time frame.   Per notes: - acute hypoxemic respiratory failure 2/2 COVID-19 PNA and hx of OSA - acute bilateral DVT    NUTRITION - FOCUSED PHYSICAL EXAM:  unable to complete at this time.   Diet Order:   Diet Order            Diet Carb Modified Fluid consistency: Thin; Room service appropriate? Yes  Diet effective now                 EDUCATION NEEDS:   Not appropriate for education at this time  Skin:  Skin Assessment: Reviewed RN Assessment  Last BM:  11/30 (type 4)  Height:   Ht Readings from Last 1 Encounters:  05/20/20 5\' 5"  (1.651 m)    Weight:   Wt Readings from Last 1 Encounters:  05/20/20 (!) 139.2 kg     Estimated Nutritional Needs:  Kcal:  2000-2200 kcal Protein:  100-115 grams Fluid:  >/= 2.2 L/day      05/22/20, MS, RD, LDN, CNSC Inpatient Clinical Dietitian RD pager # available in AMION  After hours/weekend pager # available in [facility]

## 2020-05-25 NOTE — Progress Notes (Signed)
PROGRESS NOTE  Diane Padilla  NWG:[id number] DOB: 17-Nov-1985 DOA: 05/20/2020 PCP: [facility]   Brief Narrative: Diane Padilla is a 34 y.o. female with a history of T2DM, OSA, PCOS, and obesity who presented with shortness of breath found to have covid-19 pneumonia requiring 6L supplemental oxygen which worsened to heated high flow requirement. She was admitted to the ICU/SDU and started on steroids. After initially declining other therapies, remdesivir and baricitinib were started after continued discussions and worsening clinical status. Lower extremity venous U/S revealed bilateral acute DVTs for which therapeutic dose anticoagulation was started on 11/30.   Assessment & Plan: Active Problems:   Type 2 diabetes mellitus with hyperglycemia, without long-term current use of insulin (HCC)   Class 3 severe obesity with serious comorbidity and body mass index (BMI) of 50.0 to 59.9 in adult [facility])   Pneumonia due to COVID-19 virus   Acute respiratory failure due to COVID-19 [facility])   Hyponatremia  Acute hypoxemic respiratory failure due to covid-19 pneumonia: SARS-CoV-2 PCR positive on 11/26.  - Continue remdesivir x5 days (11/28 - 12/2) - Continue steroids, at lower end of typical dosing due to hyperglycemia. CRP declining 24.6 >> 8.1, still elevated.  - Continue baricitinib (11/27 >> ) x14 days or until DC. - Encourage OOB, IS, FV, and awake proning if able - Continue airborne, contact precautions for 21 days from positive testing. - Monitor CMP and inflammatory markers  Acute bilateral peroneal vein DVTs: - Continue lovenox /kg q12h.   T2DM with steroid-induced hyperglycemia:  - Continue lantus, increase dose again today and augment SSI to resistant based on continued elevation in CBGs.  - Linagliptin  Morbid obesity: Estimated body mass index is 51.07 kg/m as calculated from the following:   Height as of this encounter:  (1.651 m).    Weight as of this encounter: 139.2 kg.  DVT prophylaxis: Lovenox Code Status: Full Family Communication: Husband by phone later today Disposition Plan:  Status is: Inpatient  Remains inpatient appropriate because:Inpatient level of care appropriate due to severity of illness  Dispo: The patient is from: Home              Anticipated d/c is to: Home              Anticipated d/c date is: > 3 days              Patient currently is not medically stable to d/c.  Consultants:   PCCM  Procedures:   None  Antimicrobials:  Remdesivir   Subjective: Shortness of breath is stable, to slightly improving. No chest pain. Able to lay on side. Interested in discharge. No chest pain or leg pain/swelling reported.  Objective: Vitals:   05/25/20 0600 05/25/20 0757 05/25/20 0800 05/25/20 0900  BP: (!) 157/77  (!) 157/73 (!) 147/71  Pulse: 94  86 86  Resp: 13  (!) 29 (!) 39  Temp:   99.7 F (37.6 C)   TempSrc:   Axillary   SpO2: 95% 91% 93% 96%  Weight:      Height:        Intake/Output Summary (Last 24 hours) at 05/25/2020 1041 Last data filed at 05/25/2020 0200 Gross per 24 hour  Intake 175.38 ml  Output 1675 ml  Net -1499.62 ml   Filed Weights   05/20/20 2118  Weight: (!) 139.2 kg    Gen: 34 y.o. female in no distress Pulm: Non-labored breathing HHF + NRB. Diminished with  slight crackles bilaterally.  CV: Regular rate and rhythm. No murmur, rub, or gallop. UTD JVD, no pitting pedal edema. GI: Abdomen soft, non-tender, non-distended, with normoactive bowel sounds. No organomegaly or masses felt. Ext: Warm, no deformities Skin: No rashes, lesions or ulcers Neuro: Alert and oriented. No focal neurological deficits. Psych: Judgement and insight appear normal. Mood & affect appropriate.   Data Reviewed: I have personally reviewed following labs and imaging studies  CBC: Recent Labs  Lab 05/21/20 0438 05/22/20 0904 05/23/20 0330 05/24/20 0326 05/25/20 0322  WBC 7.3  12.4* 13.1* 15.8* 16.5*  NEUTROABS 5.7 9.8* 10.9* 13.0* 14.1*  HGB 12.5 12.4 12.4 12.1 12.8  HCT 37.3 37.6 37.2 37.8 39.3  MCV 81.3 82.3 81.9 84.4 82.4  PLT 322 368 365 269 319   Basic Metabolic Panel: Recent Labs  Lab 05/21/20 0438 05/22/20 0904 05/23/20 0330 05/24/20 0326 05/25/20 0322  NA 136 137 137 139 137  K 4.1 4.2 4.5 4.8 4.7  CL 102 100 101 103 100  CO2 24 25 24 25 28   GLUCOSE 216* 221* 254* 270* 235*  BUN 9 12 13 18  22*  CREATININE 0.80 0.97 0.78 0.77 0.66  CALCIUM 8.5* 8.4* 8.5* 8.4* 8.5*  MG 2.4 2.5* 2.9* 3.1* 2.8*  PHOS 2.5 3.3 3.4 3.7 4.1   GFR: Estimated Creatinine Clearance: 138 mL/min (by C-G formula based on SCr of 0.66 mg/dL). Liver Function Tests: Recent Labs  Lab 05/21/20 0438 05/22/20 0904 05/23/20 0330 05/24/20 0326 05/25/20 0322  AST 44* 94* 129* 70* 41  ALT 37 65* 123* 99* 79*  ALKPHOS 51 66 76 83 96  BILITOT 0.4 0.5 0.6 0.4 0.8  PROT 7.9 8.0 7.9 7.3 7.5  ALBUMIN 3.3* 3.2* 2.9* 2.8* 2.9*   No results for input(s): LIPASE, AMYLASE in the last 168 hours. No results for input(s): AMMONIA in the last 168 hours. Coagulation Profile: No results for input(s): INR, PROTIME in the last 168 hours. Cardiac Enzymes: No results for input(s): CKTOTAL, CKMB, CKMBINDEX, TROPONINI in the last 168 hours. BNP (last 3 results) No results for input(s): PROBNP in the last 8760 hours. HbA1C: No results for input(s): HGBA1C in the last 72 hours. CBG: Recent Labs  Lab 05/24/20 1133 05/24/20 1612 05/25/20 0103 05/25/20 0214 05/25/20 0821  GLUCAP 307* 282* 247* 216* 183*   Lipid Profile: No results for input(s): CHOL, HDL, LDLCALC, TRIG, CHOLHDL, LDLDIRECT in the last 72 hours. Thyroid Function Tests: No results for input(s): TSH, T4TOTAL, FREET4, T3FREE, THYROIDAB in the last 72 hours. Anemia Panel: Recent Labs    05/24/20 0326 05/25/20 0322  FERRITIN 979* 543*   Urine analysis:    Component Value Date/Time   BILIRUBINUR negative 08/19/2018  1543   PROTEINUR Negative 08/19/2018 1543   UROBILINOGEN 0.2 08/19/2018 1543   NITRITE negative 08/19/2018 1543   LEUKOCYTESUR Moderate (2+) (A) 08/19/2018 1543   Recent Results (from the past 240 hour(s))  Resp Panel by RT-PCR (Flu A&B, Covid) Nasopharyngeal Swab     Status: Abnormal   Collection Time: 05/20/20  2:59 PM   Specimen: Nasopharyngeal Swab; Nasopharyngeal(NP) swabs in vial transport medium  Result Value Ref Range Status   SARS Coronavirus 2 by RT PCR POSITIVE (A) NEGATIVE Final    Comment: RESULT CALLED TO, READ BACK BY AND VERIFIED WITH: LAMB, S RN 1709 05/20/20 JM (NOTE) SARS-CoV-2 target nucleic acids are DETECTED.  The SARS-CoV-2 RNA is generally detectable in upper respiratory specimens during the acute phase of infection. Positive results are indicative  of the presence of the identified virus, but do not rule out bacterial infection or co-infection with other pathogens not detected by the test. Clinical correlation with patient history and other diagnostic information is necessary to determine patient infection status. The expected result is Negative.  Fact Sheet for Patients: BloggerCourse.comhttps://www.fda.gov/media/152166/download  Fact Sheet for Healthcare Providers: SeriousBroker.ithttps://www.fda.gov/media/152162/download  This test is not yet approved or cleared by the [place] FDA and  has been authorized for detection and/or diagnosis of SARS-CoV-2 by FDA under an Emergency Use Authorization (EUA).  This EUA will remain in effect (meaning this test can be used)  for the duration of  the COVID-19 declaration under Section 564(b)(1) of the Act, 21 U.S.C. section 360bbb-3(b)(1), unless the authorization is terminated or revoked sooner.     Influenza A by PCR NEGATIVE NEGATIVE Final   Influenza B by PCR NEGATIVE NEGATIVE Final    Comment: (NOTE) The Xpert Xpress SARS-CoV-2/FLU/RSV plus assay is intended as an aid in the diagnosis of influenza from Nasopharyngeal swab  specimens and should not be used as a sole basis for treatment. Nasal washings and aspirates are unacceptable for Xpert Xpress SARS-CoV-2/FLU/RSV testing.  Fact Sheet for Patients: BloggerCourse.comhttps://www.fda.gov/media/152166/download  Fact Sheet for Healthcare Providers: SeriousBroker.ithttps://www.fda.gov/media/152162/download  This test is not yet approved or cleared by the [place] FDA and has been authorized for detection and/or diagnosis of SARS-CoV-2 by FDA under an Emergency Use Authorization (EUA). This EUA will remain in effect (meaning this test can be used) for the duration of the COVID-19 declaration under Section 564(b)(1) of the Act, 21 U.S.C. section 360bbb-3(b)(1), unless the authorization is terminated or revoked.  Performed at [facility], [address] [place], [place] [address]   Blood Culture (routine x 2)     Status: None   Collection Time: 05/20/20  2:59 PM   Specimen: BLOOD  Result Value Ref Range Status   Specimen Description   Final    BLOOD SITE NOT SPECIFIED Performed at [facility], [address] [place], [place] [address]    Special Requests   Final    BOTTLES DRAWN AEROBIC AND ANAEROBIC Blood Culture adequate volume Performed at [facility], [address] [place], [place] [address]    Culture   Final    NO GROWTH 5 DAYS Performed at [facility], [address] [place], [place] [address]    Report Status 05/25/2020 FINAL  Final  Blood Culture (routine x 2)     Status: None   Collection Time: 05/20/20  4:24 PM   Specimen: BLOOD RIGHT HAND  Result Value Ref Range Status   Specimen Description   Final    BLOOD RIGHT HAND Performed at [facility], [address] [place], [place] [address]    Special Requests   Final    BOTTLES DRAWN AEROBIC AND ANAEROBIC Blood Culture adequate volume Performed at [facility], [address] [place], [place] [address]    Culture    Final    NO GROWTH 5 DAYS Performed at [facility], [address] [place], [place] [address]    Report Status 05/25/2020 FINAL  Final  MRSA PCR Screening     Status: None   Collection Time: 05/22/20  3:32 AM   Specimen: Nasal Mucosa; Nasopharyngeal  Result Value Ref Range Status   MRSA by PCR NEGATIVE NEGATIVE Final    Comment:        The GeneXpert  MRSA Assay (FDA approved for NASAL specimens only), is one component of a comprehensive MRSA colonization surveillance program. It is not intended to diagnose MRSA infection nor to guide or monitor treatment for MRSA infections. Performed at [facility] [address], [place], [place] [address]       Radiology Studies: VAS [place] LOWER EXTREMITY VENOUS (DVT)  Result Date: 05/24/2020  Lower Venous DVT Study Indications: Elevated Ddimer.  Risk Factors: COVID 19 positive. Limitations: Body habitus and poor ultrasound/tissue interface. Comparison Study: No prior studies. Performing Technologist: Chanda Busing RVT  Examination Guidelines: A complete evaluation includes B-mode imaging, spectral Doppler, color Doppler, and power Doppler as needed of all accessible portions of each vessel. Bilateral testing is considered an integral part of a complete examination. Limited examinations for reoccurring indications may be performed as noted. The reflux portion of the exam is performed with the patient in reverse Trendelenburg.  +-----+---------------+---------+-----------+----------+--------------+ RIGHTCompressibilityPhasicitySpontaneityPropertiesThrombus Aging +-----+---------------+---------+-----------+----------+--------------+ CFV  Full           Yes      Yes                                 +-----+---------------+---------+-----------+----------+--------------+ SFJ  Full                                                        +-----+---------------+---------+-----------+----------+--------------+  PERO                                              Acute          +-----+---------------+---------+-----------+----------+--------------+   +----+---------------+---------+-----------+----------+--------------+ LEFTCompressibilityPhasicitySpontaneityPropertiesThrombus Aging +----+---------------+---------+-----------+----------+--------------+ PERO                                             Acute          +----+---------------+---------+-----------+----------+--------------+     Summary: RIGHT: - Findings consistent with acute deep vein thrombosis involving the right peroneal veins. - No cystic structure found in the popliteal fossa.  LEFT: - Findings consistent with acute deep vein thrombosis involving the left peroneal veins. - No cystic structure found in the popliteal fossa.  *See table(s) above for measurements and observations. Electronically signed by Waverly Ferrari MD on 05/24/2020 at 2:10:16 PM.    Final     Scheduled Meds: . (feeding supplement) PROSource Plus  30 mL Oral TID BM  . albuterol  2 puff Inhalation Q4H  . baricitinib  4 mg Oral Daily  . Chlorhexidine Gluconate Cloth  6 each Topical Q0600  . enoxaparin (LOVENOX) injection  140 mg Subcutaneous Q12H  . hydrALAZINE  25 mg Oral Q8H  . insulin aspart  0-20 Units Subcutaneous TID WC  . insulin aspart  0-5 Units Subcutaneous QHS  . insulin glargine  18 Units Subcutaneous Daily  . linagliptin  5 mg Oral Daily  . mouth rinse  15 mL Mouth Rinse BID  . melatonin  5 mg Oral QHS  . methylPREDNISolone (SOLU-MEDROL) injection  80 mg Intravenous Q12H  . metoprolol tartrate  25  mg Oral BID  . multivitamin with minerals  1 tablet Oral Daily  . sodium chloride flush  3 mL Intravenous Q12H  . sodium chloride flush  3 mL Intravenous Q12H   Continuous Infusions: . sodium chloride 10 mL/hr at 05/24/20 1855  . remdesivir 100 mg in NS 100 mL 100 mg (05/25/20 1013)     LOS: 5 days   Time spent: 35 minutes.  Tyrone Nine, MD [facility] [email or web address] 05/25/2020, 10:41 AM

## 2020-05-25 NOTE — Progress Notes (Signed)
NAME:  Diane Padilla, MRN:  [id number], DOB:  04-08-1986, LOS: 5 ADMISSION DATE:  05/20/2020, CONSULTATION DATE:  11/29 REFERRING MD:  Dr. Lafe Garin, CHIEF COMPLAINT:  COVID 19 PNA   Brief History   34 year old female COVID-19 positive with hypoxemic respiratory failure near maxxed on heated high flow prompting pulmonary consult.   History of present illness     Past Medical History  34 year old female with past medical history as below, which is significant for diabetes and OSA on CPAP. She was in her usual state of health until approximately 11/18 when she developed a dry hacking cough. She tested positive for COVID-19. Symptoms progressed to include loss of taste and smell, fever, chills, and shortness of breath. Shortness of breath being the symptom that caused her to eventually present to [facility] long emergency department on 11/26. She was found be hypoxemic down to 84% on room air and was started on supplemental oxygen. She was admitted to the hospitalist team. She was started on steroids, but did have reservations about remdesivir baricitinib leading her to refuses medications at that time. As her symptoms continued to progressed and her oxygen requirements have worsened she has agreed to have these initiated. On 11/29 her oxygen requirements have increased to 30 L high flow at 100% in addition to nonrebreather. PCCM consulted for refractory hypoxemia.  Significant Hospital Events   11/26 admit 11/29 started on heated high flow  Consults:  PCCM  Procedures:    Significant Diagnostic Tests:    Micro Data:  BCx2 11/26 > Sputum 11/26 >  Antimicrobials:    Interim history/subjective:  No overnight issues. Has been laying on her side, but has difficulty self-proning due to body habitus. 94% on non-rebreather with HHFL at 35LPM   Objective   Blood pressure (!) 157/77, pulse 94, temperature 99.7 F (37.6 C), temperature source Axillary, resp. rate 13, height 5\' 5"  (1.651 m),  weight (!) 139.2 kg, last menstrual period 05/07/2020, SpO2 91 %.    FiO2 (%):  [100 %] 100 %   Intake/Output Summary (Last 24 hours) at 05/25/2020 0841 Last data filed at 05/25/2020 0200 Gross per 24 hour  Intake 175.38 ml  Output 1675 ml  Net -1499.62 ml   Filed Weights   05/20/20 2118  Weight: (!) 139.2 kg    Examination:  General: morbidly obese female resting in bed.  HENT: [place]/AT, PERRL, No JVD Lungs: Coarse, throughout. Breathing comfortably on HHFNC and NRB.  Cardiovascular: RRR, no MRG Abdomen: Soft, non-tender Extremities: Trace pretibial edema.   Neuro: Alert, oriented, non-focal.   Resolved Hospital Problem list     Assessment & Plan:   Acute hypoxemic respiratory failure COVID-19 pneumonia OSA on CPAP - HHFNC and NRB to keep sats > 85% resting - can perform a trial off NRB today and continue to closely monitor sats.  - Continue remdesivir and baricitinib as you are.  - Goal negative fluid balance.  - continue prone/side positioning as tolerated. Her RR seems slightly improved from 2 days ago.   - She is aware that should her condition continue to worsen she will require intubation.  Acute Bilateral DVT: sharp rise 11/29 >11/30 - continue with therapeutic dose lovenox.   Best practice (evaluated daily)   Diet: NPO Pain/Anxiety/Delirium protocol (if indicated): NA VAP protocol (if indicated): NA DVT prophylaxis: Per primary GI prophylaxis: Per primary Glucose control: Per primary Mobility: Bedrest last date of multidisciplinary goals of care discussion 11/29 Family and staff present  Myself, patient Summary of discussion Full Code Follow up goals of care discussion due 12/6 Code Status: Full Disposition: ICU  The patient is critically ill with multiple organ systems failure and requires high complexity decision making for assessment and support, frequent evaluation and titration of therapies, application of advanced monitoring technologies and  extensive interpretation of multiple databases.   Critical Care Time devoted to patient care services described in this note is 32 minutes. This time reflects time of care of this signee Charlott Holler . This critical care time does not reflect separately billable procedures or procedure time, teaching time or supervisory time of PA/NP/Med student/Med Resident etc but could involve care discussion time.  Charlott Holler [facility] 05/25/2020 8:42 AM  Pager: [phone] After hours pager: [phone]   Labs   CBC: Recent Labs  Lab 05/21/20 0438 05/22/20 0904 05/23/20 0330 05/24/20 0326 05/25/20 0322  WBC 7.3 12.4* 13.1* 15.8* 16.5*  NEUTROABS 5.7 9.8* 10.9* 13.0* 14.1*  HGB 12.5 12.4 12.4 12.1 12.8  HCT 37.3 37.6 37.2 37.8 39.3  MCV 81.3 82.3 81.9 84.4 82.4  PLT 322 368 365 269 319    Basic Metabolic Panel: Recent Labs  Lab 05/21/20 0438 05/22/20 0904 05/23/20 0330 05/24/20 0326 05/25/20 0322  NA 136 137 137 139 137  K 4.1 4.2 4.5 4.8 4.7  CL 102 100 101 103 100  CO2 24 25 24 25 28   GLUCOSE 216* 221* 254* 270* 235*  BUN 9 12 13 18  22*  CREATININE 0.80 0.97 0.78 0.77 0.66  CALCIUM 8.5* 8.4* 8.5* 8.4* 8.5*  MG 2.4 2.5* 2.9* 3.1* 2.8*  PHOS 2.5 3.3 3.4 3.7 4.1   GFR: Estimated Creatinine Clearance: 138 mL/min (by C-G formula based on SCr of 0.66 mg/dL). Recent Labs  Lab 05/20/20 1459 05/21/20 0438 05/22/20 0904 05/23/20 0330 05/24/20 0326 05/25/20 0322  PROCALCITON 0.20  --   --  0.11  --   --   WBC 7.4   < > 12.4* 13.1* 15.8* 16.5*  LATICACIDVEN 0.8  --   --   --   --   --    < > = values in this interval not displayed.    Liver Function Tests: Recent Labs  Lab 05/21/20 0438 05/22/20 0904 05/23/20 0330 05/24/20 0326 05/25/20 0322  AST 44* 94* 129* 70* 41  ALT 37 65* 123* 99* 79*  ALKPHOS 51 66 76 83 96  BILITOT 0.4 0.5 0.6 0.4 0.8  PROT 7.9 8.0 7.9 7.3 7.5  ALBUMIN 3.3* 3.2* 2.9* 2.8* 2.9*   No results for  input(s): LIPASE, AMYLASE in the last 168 hours. No results for input(s): AMMONIA in the last 168 hours.  ABG No results found for: PHART, PCO2ART, PO2ART, HCO3, TCO2, ACIDBASEDEF, O2SAT   Coagulation Profile: No results for input(s): INR, PROTIME in the last 168 hours.  Cardiac Enzymes: No results for input(s): CKTOTAL, CKMB, CKMBINDEX, TROPONINI in the last 168 hours.  HbA1C: Hgb A1c MFr Bld  Date/Time Value Ref Range Status  05/21/2020 04:38 AM 6.6 (H) 4.8 - 5.6 % Final    Comment:    (NOTE) Pre diabetes:          5.7%-6.4%  Diabetes:              >6.4%  Glycemic control for   <7.0% adults with diabetes   01/19/2019 12:19 PM 6.1 (H) 4.8 - 5.6 % Final    Comment:  Prediabetes: 5.7 - 6.4          Diabetes: >6.4          Glycemic control for adults with diabetes: <7.0     CBG: Recent Labs  Lab 05/24/20 1133 05/24/20 1612 05/25/20 0103 05/25/20 0214 05/25/20 0821  GLUCAP 307* 282* 247* 216* 183*

## 2020-05-26 DIAGNOSIS — J9600 Acute respiratory failure, unspecified whether with hypoxia or hypercapnia: Secondary | ICD-10-CM | POA: Diagnosis not present

## 2020-05-26 DIAGNOSIS — U071 COVID-19: Secondary | ICD-10-CM | POA: Diagnosis not present

## 2020-05-26 DIAGNOSIS — E1165 Type 2 diabetes mellitus with hyperglycemia: Secondary | ICD-10-CM | POA: Diagnosis not present

## 2020-05-26 DIAGNOSIS — E871 Hypo-osmolality and hyponatremia: Secondary | ICD-10-CM | POA: Diagnosis not present

## 2020-05-26 LAB — COMPREHENSIVE METABOLIC PANEL
ALT: 77 U/L — ABNORMAL HIGH (ref 0–44)
AST: 38 U/L (ref 15–41)
Albumin: 2.9 g/dL — ABNORMAL LOW (ref 3.5–5.0)
Alkaline Phosphatase: 89 U/L (ref 38–126)
Anion gap: 11 (ref 5–15)
BUN: 19 mg/dL (ref 6–20)
CO2: 23 mmol/L (ref 22–32)
Calcium: 8.7 mg/dL — ABNORMAL LOW (ref 8.9–10.3)
Chloride: 102 mmol/L (ref 98–111)
Creatinine, Ser: 0.65 mg/dL (ref 0.44–1.00)
GFR, Estimated: >60 mL/min (ref >60)
Glucose, Bld: 240 mg/dL — ABNORMAL HIGH (ref 70–99)
Potassium: 4.9 mmol/L (ref 3.5–5.1)
Sodium: 136 mmol/L (ref 135–145)
Total Bilirubin: 0.7 mg/dL (ref 0.3–1.2)
Total Protein: 7.4 g/dL (ref 6.5–8.1)

## 2020-05-26 LAB — GLUCOSE, CAPILLARY
Glucose-Capillary: 209 mg/dL — ABNORMAL HIGH (ref 70–99)
Glucose-Capillary: 214 mg/dL — ABNORMAL HIGH (ref 70–99)
Glucose-Capillary: 229 mg/dL — ABNORMAL HIGH (ref 70–99)
Glucose-Capillary: 231 mg/dL — ABNORMAL HIGH (ref 70–99)
Glucose-Capillary: 250 mg/dL — ABNORMAL HIGH (ref 70–99)

## 2020-05-26 LAB — C-REACTIVE PROTEIN: CRP: 4.7 mg/dL — ABNORMAL HIGH (ref <1.0)

## 2020-05-26 MED ORDER — METHYLPREDNISOLONE SODIUM SUCC 125 MG IJ SOLR
60.0000 mg | Freq: Two times a day (BID) | INTRAMUSCULAR | Status: DC
  Administered 2020-05-26 – 2020-05-27 (×2): 60 mg via INTRAVENOUS
  Filled 2020-05-26 (×2): qty 2

## 2020-05-26 MED ORDER — INSULIN GLARGINE 100 UNIT/ML ~~LOC~~ SOLN
25.0000 [IU] | Freq: Every day | SUBCUTANEOUS | Status: DC
  Administered 2020-05-26: 25 [IU] via SUBCUTANEOUS
  Filled 2020-05-26 (×2): qty 0.25

## 2020-05-26 MED ORDER — INSULIN ASPART 100 UNIT/ML ~~LOC~~ SOLN
3.0000 [IU] | Freq: Three times a day (TID) | SUBCUTANEOUS | Status: DC
  Administered 2020-05-26 – 2020-05-27 (×3): 3 [IU] via SUBCUTANEOUS

## 2020-05-26 NOTE — Progress Notes (Signed)
NAME:  Diane Padilla, MRN:  [id number], DOB:  02-07-1986, LOS: 6 ADMISSION DATE:  05/20/2020, CONSULTATION DATE:  11/29 REFERRING MD:  Dr. Lafe Garin, CHIEF COMPLAINT:  COVID 19 PNA   Brief History   34 year old female COVID-19 positive with hypoxemic respiratory failure near maxxed on heated high flow prompting pulmonary consult.   History of present illness     Past Medical History  34 year old female with past medical history as below, which is significant for diabetes and OSA on CPAP. She was in her usual state of health until approximately 11/18 when she developed a dry hacking cough. She tested positive for COVID-19. Symptoms progressed to include loss of taste and smell, fever, chills, and shortness of breath. Shortness of breath being the symptom that caused her to eventually present to [facility] emergency department on 11/26. She was found be hypoxemic down to 84% on room air and was started on supplemental oxygen. She was admitted to the hospitalist team. She was started on steroids, but did have reservations about remdesivir baricitinib leading her to refuses medications at that time. As her symptoms continued to progressed and her oxygen requirements have worsened she has agreed to have these initiated. On 11/29 her oxygen requirements have increased to 30 L high flow at 100% in addition to nonrebreather. PCCM consulted for refractory hypoxemia.  Significant Hospital Events   11/26 admit 11/29 started on heated high flow  Consults:  PCCM  Procedures:    Significant Diagnostic Tests:    Micro Data:  BCx2 11/26 > Sputum 11/26 >  Antimicrobials:    Interim history/subjective:  No overnight issues. trialed off NRB this morning and sats dropped form 94% to 82%. Recovered quickly.   Objective   Blood pressure (!) 152/80, pulse (!) 104, temperature 98.7 F (37.1 C), temperature source Oral, resp. rate (!) 44, height 5\' 5"  (1.651 m), weight (!) 139.2 kg, last menstrual  period 05/07/2020, SpO2 92 %.    FiO2 (%):  [100 %] 100 %   Intake/Output Summary (Last 24 hours) at 05/26/2020 0857 Last data filed at 05/26/2020 0515 Gross per 24 hour  Intake --  Output 1000 ml  Net -1000 ml   Filed Weights   05/20/20 2118  Weight: (!) 139.2 kg    Examination:  General: morbidly obese female resting in bed, trying to lean on her side HENT: [place]/AT, PERRL, No JVD Lungs: diminished throughout. Breathing comfortably on HHFNC and NRB.  Cardiovascular: RRR, no MRG Abdomen: Soft, non-tender Extremities: Trace pretibial edema.   Neuro: Alert, oriented, non-focal.   Resolved Hospital Problem list     Assessment & Plan:   Acute hypoxemic respiratory failure COVID-19 pneumonia OSA on CPAP - HHFNC and NRB to keep sats > 85% resting, can maybe come down to 20LPM on the [facility] today since she didn't tolerate coming down on NRB. Suspect she has atelectasis and derecruits lung easily. - Continue remdesivir and baricitinib as you are.  - Goal negative fluid balance.  - continue prone/side positioning as tolerated.  - She is aware that should her condition continue to worsen she will require intubation.  Acute Bilateral DVT: sharp rise 11/29 >11/30 - continue with therapeutic dose lovenox.   Best practice (evaluated daily)   Diet: NPO Pain/Anxiety/Delirium protocol (if indicated): NA VAP protocol (if indicated): NA DVT prophylaxis: Per primary GI prophylaxis: Per primary Glucose control: Per primary Mobility: Bedrest last date of multidisciplinary goals of care discussion 11/29 Family and staff present  Myself, patient Summary of discussion Full Code Follow up goals of care discussion due 12/6 Code Status: Full Disposition: ICU  The patient is critically ill with multiple organ systems failure and requires high complexity decision making for assessment and support, frequent evaluation and titration of therapies, application of advanced monitoring technologies  and extensive interpretation of multiple databases.   Critical Care Time devoted to patient care services described in this note is 33 minutes. This time reflects time of care of this signee Charlott Holler . This critical care time does not reflect separately billable procedures or procedure time, teaching time or supervisory time of PA/NP/Med student/Med Resident etc but could involve care discussion time.  Mickel Baas Pulmonary and Critical Care Medicine 05/26/2020 8:57 AM  Pager: [phone] After hours pager: [phone]   Labs   CBC: Recent Labs  Lab 05/21/20 0438 05/22/20 0904 05/23/20 0330 05/24/20 0326 05/25/20 0322  WBC 7.3 12.4* 13.1* 15.8* 16.5*  NEUTROABS 5.7 9.8* 10.9* 13.0* 14.1*  HGB 12.5 12.4 12.4 12.1 12.8  HCT 37.3 37.6 37.2 37.8 39.3  MCV 81.3 82.3 81.9 84.4 82.4  PLT 322 368 365 269 319    Basic Metabolic Panel: Recent Labs  Lab 05/21/20 0438 05/21/20 0438 05/22/20 0904 05/23/20 0330 05/24/20 0326 05/25/20 0322 05/26/20 0319  NA 136   < > 137 137 139 137 136  K 4.1   < > 4.2 4.5 4.8 4.7 4.9  CL 102   < > 100 101 103 100 102  CO2 24   < > 25 24 25 28 23   GLUCOSE 216*   < > 221* 254* 270* 235* 240*  BUN 9   < > 12 13 18  22* 19  CREATININE 0.80   < > 0.97 0.78 0.77 0.66 0.65  CALCIUM 8.5*   < > 8.4* 8.5* 8.4* 8.5* 8.7*  MG 2.4  --  2.5* 2.9* 3.1* 2.8*  --   PHOS 2.5  --  3.3 3.4 3.7 4.1  --    < > = values in this interval not displayed.   GFR: Estimated Creatinine Clearance: 138 mL/min (by C-G formula based on SCr of 0.65 mg/dL). Recent Labs  Lab 05/20/20 1459 05/21/20 0438 05/22/20 0904 05/23/20 0330 05/24/20 0326 05/25/20 0322  PROCALCITON 0.20  --   --  0.11  --   --   WBC 7.4   < > 12.4* 13.1* 15.8* 16.5*  LATICACIDVEN 0.8  --   --   --   --   --    < > = values in this interval not displayed.    Liver Function Tests: Recent Labs  Lab 05/22/20 0904 05/23/20 0330 05/24/20 0326 05/25/20 0322 05/26/20 0319  AST  94* 129* 70* 41 38  ALT 65* 123* 99* 79* 77*  ALKPHOS 66 76 83 96 89  BILITOT 0.5 0.6 0.4 0.8 0.7  PROT 8.0 7.9 7.3 7.5 7.4  ALBUMIN 3.2* 2.9* 2.8* 2.9* 2.9*   No results for input(s): LIPASE, AMYLASE in the last 168 hours. No results for input(s): AMMONIA in the last 168 hours.  ABG No results found for: PHART, PCO2ART, PO2ART, HCO3, TCO2, ACIDBASEDEF, O2SAT   Coagulation Profile: No results for input(s): INR, PROTIME in the last 168 hours.  Cardiac Enzymes: No results for input(s): CKTOTAL, CKMB, CKMBINDEX, TROPONINI in the last 168 hours.  HbA1C: Hgb A1c MFr Bld  Date/Time Value Ref Range Status  05/21/2020 04:38 AM 6.6 (H) 4.8 - 5.6 % Final  Comment:    (NOTE) Pre diabetes:          5.7%-6.4%  Diabetes:              >6.4%  Glycemic control for   <7.0% adults with diabetes   01/19/2019 12:19 PM 6.1 (H) 4.8 - 5.6 % Final    Comment:             Prediabetes: 5.7 - 6.4          Diabetes: >6.4          Glycemic control for adults with diabetes: <7.0     CBG: Recent Labs  Lab 05/25/20 1213 05/25/20 1548 05/25/20 2010 05/25/20 2256 05/26/20 0752  GLUCAP 276* 266* 160* 238* 209*

## 2020-05-26 NOTE — TOC Progression Note (Signed)
Transition of Care [facility]) - Progression Note    Patient Details  Name: Diane Padilla MRN: [id number] Date of Birth: [birth year]  Transition of Care [facility] CM/SW Contact  Golda Acre, RN Phone Number: 05/26/2020, 8:02 AM  Clinical Narrative:    Assessment & Plan: Active Problems:   Type 2 diabetes mellitus with hyperglycemia, without long-term current use of insulin (HCC)   Class 3 severe obesity with serious comorbidity and body mass index (BMI) of 50.0 to 59.9 in adult [facility])   Pneumonia due to COVID-19 virus   Acute respiratory failure due to COVID-19 [facility])   Hyponatremia  Acute hypoxemic respiratory failure due to covid-19 pneumonia: SARS-CoV-2 PCR positive on 11/26.  - Continue remdesivir x5 days (11/28 - 12/2) - Continue steroids, at lower end of typical dosing due to hyperglycemia. CRP declining 24.6 >> 8.1, still elevated.  - Continue baricitinib (11/27 >> ) x14 days or until DC. - Encourage OOB, IS, FV, and awake proning if able - Continue airborne, contact precautions for 21 days from positive testing. - Monitor CMP and inflammatory markers  Acute bilateral peroneal vein DVTs: - Continue lovenox 1mg /kg q12h.   T2DM with steroid-induced hyperglycemia:  - Continue lantus, increase dose again today and augment SSI to resistant based on continued elevation in CBGs.  - Linagliptin  Morbid obesity: Estimated body mass index is 51.07 kg/m as calculated from the following:   Height as of this encounter: 5\' 5"  (1.651 m).   Weight as of this encounter: 139.2 kg. PLAN: to return to home Following for progression  Expected Discharge Plan: Home/Self Care Barriers to Discharge: No Barriers Identified  Expected Discharge Plan and Services Expected Discharge Plan: Home/Self Care   Discharge Planning Services: CM Consult   Living arrangements for the past 2 months: Apartment                                       Social Determinants of Health (SDOH)  Interventions    Readmission Risk Interventions No flowsheet data found.

## 2020-05-26 NOTE — Progress Notes (Signed)
PROGRESS NOTE  Diane Padilla  ZOX:[id number] DOB: 17-Oct-1985 DOA: 05/20/2020 PCP: [facility]   Brief Narrative: Diane Padilla is a 34 y.o. female with a history of T2DM, OSA, PCOS, and obesity who presented with shortness of breath found to have covid-19 pneumonia requiring 6L supplemental oxygen which worsened to heated high flow requirement. She was admitted to the ICU/SDU and started on steroids. After initially declining other therapies, remdesivir and baricitinib were started after continued discussions and worsening clinical status. Lower extremity venous U/S revealed bilateral acute DVTs for which therapeutic dose anticoagulation was started on 11/30.   Assessment & Plan: Active Problems:   Type 2 diabetes mellitus with hyperglycemia, without long-term current use of insulin (HCC)   Class 3 severe obesity with serious comorbidity and body mass index (BMI) of 50.0 to 59.9 in adult [facility])   Pneumonia due to COVID-19 virus   Acute respiratory failure due to COVID-19 [facility])   Hyponatremia  Acute hypoxemic respiratory failure due to covid-19 pneumonia: SARS-CoV-2 PCR positive on 11/26.  - Continue remdesivir x5 days (11/28 - 12/2) - Continue steroids, at lower end of typical dosing due to hyperglycemia. CRP responding. - Continue baricitinib (11/27 >> ) x14 days or until DC. - Encourage OOB, IS, FV, and awake proning if able - Continue airborne, contact precautions for 21 days from positive testing. - Monitor CMP and inflammatory markers  ALT elevation: Likely due to covid-19, mild and stable.  - Monitor intermittently.  Acute bilateral peroneal vein DVTs: - Continue lovenox 1mg /kg q12h.   T2DM with steroid-induced hyperglycemia:  - Continue lantus, increase further to 25u daily, may need PM dosing as well. Continue resistant SSI and add mealtime insulin, continue HS coverage. No hypoglycemia noted.  - Linagliptin, continue  Morbid obesity:  Estimated body mass index is 51.07 kg/m as calculated from the following:   Height as of this encounter: 5\' 5"  (1.651 m).   Weight as of this encounter: 139.2 kg.  DVT prophylaxis: Lovenox Code Status: Full Family Communication: Husband and mother by phone. Disability paperwork completed with available data. Disposition Plan:  Status is: Inpatient  Remains inpatient appropriate because:Inpatient level of care appropriate due to severity of illness  Dispo: The patient is from: Home              Anticipated d/c is to: Home              Anticipated d/c date is: > 3 days              Patient currently is not medically stable to d/c.  Consultants:   PCCM  Procedures:   None  Antimicrobials:  Remdesivir 11/28 - 12/2   Subjective: Shortness of breath is stable, to slightly improving. No chest pain. Able to lay on side. Interested in discharge. No chest pain or leg pain/swelling reported.  Objective: Vitals:   05/26/20 0737 05/26/20 0800 05/26/20 0831 05/26/20 1124  BP:  (!) 152/80  (!) 148/89  Pulse: 94 (!) 104  (!) 109  Resp: (!) 31 (!) 44  (!) 38  Temp:  98.7 F (37.1 C)    TempSrc:  Oral    SpO2: 95% 92% 92% 95%  Weight:      Height:        Intake/Output Summary (Last 24 hours) at 05/26/2020 1224 Last data filed at 05/26/2020 1130 Gross per 24 hour  Intake 120 ml  Output 1000 ml  Net -880 ml  Filed Weights   05/20/20 2118  Weight: (!) 139.2 kg   Gen: 34 y.o. female in no distress Pulm: Tachypneic without accessory muscle use on HHF + NRB, diminished globally. CV: Regular borderline tachycardia. No murmur, rub, or gallop. No definite JVD, no pitting dependent edema. GI: Abdomen soft, non-tender, non-distended, with normoactive bowel sounds.  Ext: Warm, no deformities Skin: No new rashes, lesions or ulcers on visualized skin. Neuro: Alert and oriented. No focal neurological deficits. Psych: Judgement and insight appear fair. Mood euthymic & affect congruent.  Behavior is appropriate.    Data Reviewed: I have personally reviewed following labs and imaging studies  CBC: Recent Labs  Lab 05/21/20 0438 05/22/20 0904 05/23/20 0330 05/24/20 0326 05/25/20 0322  WBC 7.3 12.4* 13.1* 15.8* 16.5*  NEUTROABS 5.7 9.8* 10.9* 13.0* 14.1*  HGB 12.5 12.4 12.4 12.1 12.8  HCT 37.3 37.6 37.2 37.8 39.3  MCV 81.3 82.3 81.9 84.4 82.4  PLT 322 368 365 269 319   Basic Metabolic Panel: Recent Labs  Lab 05/21/20 0438 05/21/20 0438 05/22/20 0904 05/23/20 0330 05/24/20 0326 05/25/20 0322 05/26/20 0319  NA 136   < > 137 137 139 137 136  K 4.1   < > 4.2 4.5 4.8 4.7 4.9  CL 102   < > 100 101 103 100 102  CO2 24   < > 25 24 25 28 23   GLUCOSE 216*   < > 221* 254* 270* 235* 240*  BUN 9   < > 12 13 18  22* 19  CREATININE 0.80   < > 0.97 0.78 0.77 0.66 0.65  CALCIUM 8.5*   < > 8.4* 8.5* 8.4* 8.5* 8.7*  MG 2.4  --  2.5* 2.9* 3.1* 2.8*  --   PHOS 2.5  --  3.3 3.4 3.7 4.1  --    < > = values in this interval not displayed.   GFR: Estimated Creatinine Clearance: 138 mL/min (by C-G formula based on SCr of 0.65 mg/dL). Liver Function Tests: Recent Labs  Lab 05/22/20 0904 05/23/20 0330 05/24/20 0326 05/25/20 0322 05/26/20 0319  AST 94* 129* 70* 41 38  ALT 65* 123* 99* 79* 77*  ALKPHOS 66 76 83 96 89  BILITOT 0.5 0.6 0.4 0.8 0.7  PROT 8.0 7.9 7.3 7.5 7.4  ALBUMIN 3.2* 2.9* 2.8* 2.9* 2.9*   No results for input(s): LIPASE, AMYLASE in the last 168 hours. No results for input(s): AMMONIA in the last 168 hours. Coagulation Profile: No results for input(s): INR, PROTIME in the last 168 hours. Cardiac Enzymes: No results for input(s): CKTOTAL, CKMB, CKMBINDEX, TROPONINI in the last 168 hours. BNP (last 3 results) No results for input(s): PROBNP in the last 8760 hours. HbA1C: No results for input(s): HGBA1C in the last 72 hours. CBG: Recent Labs  Lab 05/25/20 1213 05/25/20 1548 05/25/20 2010 05/25/20 2256 05/26/20 0752  GLUCAP 276* 266* 160* 238*  209*   Lipid Profile: No results for input(s): CHOL, HDL, LDLCALC, TRIG, CHOLHDL, LDLDIRECT in the last 72 hours. Thyroid Function Tests: No results for input(s): TSH, T4TOTAL, FREET4, T3FREE, THYROIDAB in the last 72 hours. Anemia Panel: Recent Labs    05/24/20 0326 05/25/20 0322  FERRITIN 979* 543*   Urine analysis:    Component Value Date/Time   BILIRUBINUR negative 08/19/2018 1543   PROTEINUR Negative 08/19/2018 1543   UROBILINOGEN 0.2 08/19/2018 1543   NITRITE negative 08/19/2018 1543   LEUKOCYTESUR Moderate (2+) (A) 08/19/2018 1543   Recent Results (from the past 240 hour(s))  Resp Panel by RT-PCR (Flu A&B, Covid) Nasopharyngeal Swab     Status: Abnormal   Collection Time: 05/20/20  2:59 PM   Specimen: Nasopharyngeal Swab; Nasopharyngeal(NP) swabs in vial transport medium  Result Value Ref Range Status   SARS Coronavirus 2 by RT PCR POSITIVE (A) NEGATIVE Final    Comment: RESULT CALLED TO, READ BACK BY AND VERIFIED WITH: LAMB, S RN 1709 05/20/20 JM (NOTE) SARS-CoV-2 target nucleic acids are DETECTED.  The SARS-CoV-2 RNA is generally detectable in upper respiratory specimens during the acute phase of infection. Positive results are indicative of the presence of the identified virus, but do not rule out bacterial infection or co-infection with other pathogens not detected by the test. Clinical correlation with patient history and other diagnostic information is necessary to determine patient infection status. The expected result is Negative.  Fact Sheet for Patients: BloggerCourse.com  Fact Sheet for Healthcare Providers: SeriousBroker.it  This test is not yet approved or cleared by the [place] FDA and  has been authorized for detection and/or diagnosis of SARS-CoV-2 by FDA under an Emergency Use Authorization (EUA).  This EUA will remain in effect (meaning this test can be used)  for the duration of  the  COVID-19 declaration under Section 564(b)(1) of the Act, 21 U.S.C. section 360bbb-3(b)(1), unless the authorization is terminated or revoked sooner.     Influenza A by PCR NEGATIVE NEGATIVE Final   Influenza B by PCR NEGATIVE NEGATIVE Final    Comment: (NOTE) The Xpert Xpress SARS-CoV-2/FLU/RSV plus assay is intended as an aid in the diagnosis of influenza from Nasopharyngeal swab specimens and should not be used as a sole basis for treatment. Nasal washings and aspirates are unacceptable for Xpert Xpress SARS-CoV-2/FLU/RSV testing.  Fact Sheet for Patients: BloggerCourse.com  Fact Sheet for Healthcare Providers: SeriousBroker.it  This test is not yet approved or cleared by the [place] FDA and has been authorized for detection and/or diagnosis of SARS-CoV-2 by FDA under an Emergency Use Authorization (EUA). This EUA will remain in effect (meaning this test can be used) for the duration of the COVID-19 declaration under Section 564(b)(1) of the Act, 21 U.S.C. section 360bbb-3(b)(1), unless the authorization is terminated or revoked.  Performed at [facility], [address] [place], [place] [address]   Blood Culture (routine x 2)     Status: None   Collection Time: 05/20/20  2:59 PM   Specimen: BLOOD  Result Value Ref Range Status   Specimen Description   Final    BLOOD SITE NOT SPECIFIED Performed at [facility], [address], [place], [place] [address]    Special Requests   Final    BOTTLES DRAWN AEROBIC AND ANAEROBIC Blood Culture adequate volume Performed at [facility], [address] [place], [place] [address]    Culture   Final    NO GROWTH 5 DAYS Performed at [facility], [address], [place], [place] [address]    Report Status 05/25/2020 FINAL  Final  Blood Culture (routine x 2)     Status: None   Collection Time: 05/20/20  4:24 PM   Specimen:  BLOOD RIGHT HAND  Result Value Ref Range Status   Specimen Description   Final    BLOOD RIGHT HAND Performed at [facility], [address], [place], [place] [address]    Special Requests   Final    BOTTLES DRAWN AEROBIC AND ANAEROBIC Blood Culture adequate volume Performed  at [facility], [address], [place], [place] [address]    Culture   Final    NO GROWTH 5 DAYS Performed at [facility], [address], [place], [place] [address]    Report Status 05/25/2020 FINAL  Final  MRSA PCR Screening     Status: None   Collection Time: 05/22/20  3:32 AM   Specimen: Nasal Mucosa; Nasopharyngeal  Result Value Ref Range Status   MRSA by PCR NEGATIVE NEGATIVE Final    Comment:        The GeneXpert MRSA Assay (FDA approved for NASAL specimens only), is one component of a comprehensive MRSA colonization surveillance program. It is not intended to diagnose MRSA infection nor to guide or monitor treatment for MRSA infections. Performed at [facility], [address], [place], [place] [address]       Radiology Studies: No results found.  Scheduled Meds: . (feeding supplement) PROSource Plus  30 mL Oral TID BM  . albuterol  2 puff Inhalation Q4H  . baricitinib  4 mg Oral Daily  . Chlorhexidine Gluconate Cloth  6 each Topical Q0600  . enoxaparin (LOVENOX) injection  140 mg Subcutaneous Q12H  . hydrALAZINE  25 mg Oral Q8H  . insulin aspart  0-20 Units Subcutaneous TID WC  . insulin aspart  0-5 Units Subcutaneous QHS  . insulin aspart  3 Units Subcutaneous TID WC  . insulin glargine  25 Units Subcutaneous Daily  . linagliptin  5 mg Oral Daily  . mouth rinse  15 mL Mouth Rinse BID  . melatonin  5 mg Oral QHS  . methylPREDNISolone (SOLU-MEDROL) injection  60 mg Intravenous Q12H  . metoprolol tartrate  25 mg Oral BID  . multivitamin with minerals  1 tablet Oral Daily  . sodium chloride flush  3 mL Intravenous Q12H   . sodium chloride flush  3 mL Intravenous Q12H   Continuous Infusions: . sodium chloride 10 mL/hr at 05/24/20 1855     LOS: 6 days   Time spent: 35 minutes.  Tyrone Nine, MD [facility] [email or web address] 05/26/2020, 12:24 PM

## 2020-05-27 ENCOUNTER — Inpatient Hospital Stay (HOSPITAL_COMMUNITY): Payer: BC Managed Care – PPO

## 2020-05-27 DIAGNOSIS — U071 COVID-19: Secondary | ICD-10-CM | POA: Diagnosis not present

## 2020-05-27 DIAGNOSIS — J9601 Acute respiratory failure with hypoxia: Secondary | ICD-10-CM

## 2020-05-27 DIAGNOSIS — E1165 Type 2 diabetes mellitus with hyperglycemia: Secondary | ICD-10-CM | POA: Diagnosis not present

## 2020-05-27 DIAGNOSIS — E871 Hypo-osmolality and hyponatremia: Secondary | ICD-10-CM | POA: Diagnosis not present

## 2020-05-27 LAB — GLUCOSE, CAPILLARY
Glucose-Capillary: 187 mg/dL — ABNORMAL HIGH (ref 70–99)
Glucose-Capillary: 230 mg/dL — ABNORMAL HIGH (ref 70–99)
Glucose-Capillary: 251 mg/dL — ABNORMAL HIGH (ref 70–99)
Glucose-Capillary: 215 mg/dL — ABNORMAL HIGH (ref 70–99)
Glucose-Capillary: 227 mg/dL — ABNORMAL HIGH (ref 70–99)

## 2020-05-27 LAB — CBC WITH DIFFERENTIAL/PLATELET
Abs Immature Granulocytes: 0.63 10*3/uL — ABNORMAL HIGH (ref 0.00–0.07)
Basophils Absolute: 0.1 10*3/uL (ref 0.0–0.1)
Basophils Relative: 0 %
Eosinophils Absolute: 0 10*3/uL (ref 0.0–0.5)
Eosinophils Relative: 0 %
HCT: 42.6 % (ref 36.0–46.0)
Hemoglobin: 14.1 g/dL (ref 12.0–15.0)
Immature Granulocytes: 3 %
Lymphocytes Relative: 6 %
Lymphs Abs: 1.6 10*3/uL (ref 0.7–4.0)
MCH: 27.3 pg (ref 26.0–34.0)
MCHC: 33.1 g/dL (ref 30.0–36.0)
MCV: 82.4 fL (ref 80.0–100.0)
Monocytes Absolute: 0.9 10*3/uL (ref 0.1–1.0)
Monocytes Relative: 4 %
Neutro Abs: 21.6 10*3/uL — ABNORMAL HIGH (ref 1.7–7.7)
Neutrophils Relative %: 87 %
Platelets: 441 10*3/uL — ABNORMAL HIGH (ref 150–400)
RBC: 5.17 MIL/uL — ABNORMAL HIGH (ref 3.87–5.11)
RDW: 14.2 % (ref 11.5–15.5)
WBC: 24.8 10*3/uL — ABNORMAL HIGH (ref 4.0–10.5)
nRBC: 0 % (ref 0.0–0.2)

## 2020-05-27 LAB — C-REACTIVE PROTEIN: CRP: 9.7 mg/dL — ABNORMAL HIGH (ref <1.0)

## 2020-05-27 LAB — PROCALCITONIN: Procalcitonin: 0.15 ng/mL

## 2020-05-27 LAB — MRSA PCR SCREENING: MRSA by PCR: NEGATIVE

## 2020-05-27 MED ORDER — METHYLPREDNISOLONE SODIUM SUCC 125 MG IJ SOLR
80.0000 mg | Freq: Two times a day (BID) | INTRAMUSCULAR | Status: DC
  Administered 2020-05-27 – 2020-05-29 (×4): 80 mg via INTRAVENOUS
  Filled 2020-05-27 (×4): qty 2

## 2020-05-27 MED ORDER — METOPROLOL TARTRATE 25 MG PO TABS
50.0000 mg | ORAL_TABLET | Freq: Two times a day (BID) | ORAL | Status: DC
  Administered 2020-05-27 – 2020-05-30 (×8): 50 mg via ORAL
  Filled 2020-05-27 (×8): qty 2

## 2020-05-27 MED ORDER — SODIUM CHLORIDE 0.9 % IV SOLN
2.0000 g | Freq: Three times a day (TID) | INTRAVENOUS | Status: AC
  Administered 2020-05-27 – 2020-06-03 (×20): 2 g via INTRAVENOUS
  Filled 2020-05-27 (×21): qty 2

## 2020-05-27 MED ORDER — INSULIN ASPART 100 UNIT/ML ~~LOC~~ SOLN
5.0000 [IU] | Freq: Three times a day (TID) | SUBCUTANEOUS | Status: DC
  Administered 2020-05-27 (×2): 5 [IU] via SUBCUTANEOUS

## 2020-05-27 MED ORDER — TRAZODONE HCL 50 MG PO TABS
50.0000 mg | ORAL_TABLET | Freq: Every evening | ORAL | Status: DC | PRN
  Filled 2020-05-27: qty 1

## 2020-05-27 MED ORDER — VANCOMYCIN HCL 2000 MG/400ML IV SOLN
2000.0000 mg | Freq: Once | INTRAVENOUS | Status: AC
  Administered 2020-05-27: 2000 mg via INTRAVENOUS
  Filled 2020-05-27: qty 400

## 2020-05-27 MED ORDER — INSULIN GLARGINE 100 UNIT/ML ~~LOC~~ SOLN
28.0000 [IU] | Freq: Every day | SUBCUTANEOUS | Status: DC
  Administered 2020-05-27: 28 [IU] via SUBCUTANEOUS
  Filled 2020-05-27 (×2): qty 0.28

## 2020-05-27 MED ORDER — VANCOMYCIN HCL 1250 MG/250ML IV SOLN
1250.0000 mg | Freq: Two times a day (BID) | INTRAVENOUS | Status: DC
  Administered 2020-05-28: 1250 mg via INTRAVENOUS
  Filled 2020-05-27: qty 250

## 2020-05-27 NOTE — Plan of Care (Signed)
This RN will continue to monitor patient's progression of care plan.  

## 2020-05-27 NOTE — Progress Notes (Signed)
ANTICOAGULATION CONSULT NOTE - Initial Consult  Pharmacy Consult for enoxaparin Indication: VTE  No Known Allergies  Patient Measurements: Height: 5\' 5"  (165.1 cm) Weight: (!) 139.2 kg (306 lb 14.1 oz) IBW/kg (Calculated) : 57   Vital Signs: Temp: 100.2 F (37.9 C) (12/03 0741) Temp Source: Oral (12/03 0741) BP: 125/58 (12/03 0833) Pulse Rate: 109 (12/03 0900)  Labs: Recent Labs    05/25/20 0322 05/26/20 0319  HGB 12.8  --   HCT 39.3  --   PLT 319  --   CREATININE 0.66 0.65    Estimated Creatinine Clearance: 138 mL/min (by C-G formula based on SCr of 0.65 mg/dL).   Medical History: Past Medical History:  Diagnosis Date  . Allergy   . Back pain   . Depression   . Diabetes mellitus without complication (HCC) 07/20/13   no meds at this time  . Dyspnea   . GERD (gastroesophageal reflux disease)   . PCOS (polycystic ovarian syndrome)   . Sleep apnea   . Vitamin D deficiency     Assessment: Pt is a 34 year old female admitted with respiratory failure due to COVID-19 PNA. Pharmacy consulted to dose enoxaparin for acute bilateral DVT. Pt not on anticoagulants PTA.  Today, 05/27/20  CBC stable, WNL  SCr WNL, stable  TBW = 139 kg  Goal of Therapy:  Anti-Xa level 0.6-1 units/ml 4hrs after LMWH dose given Monitor platelets by anticoagulation protocol:   Plan:   Continue therapeutic enoxaparin 1 mg/kg (140mg ) SubQ q12h  Follow renal function  Recheck CBC with AM labs tomorrow  Anti-Xa levels as needed  14/03/21, PharmD 05/27/20 10:05 AM

## 2020-05-27 NOTE — Progress Notes (Addendum)
   NAME:  Diane Padilla, MRN:  [id number], DOB:  03-08-1986, LOS: 7 ADMISSION DATE:  05/20/2020, CONSULTATION DATE:  11/29 REFERRING MD:  Dr. Lafe Garin, CHIEF COMPLAINT:  COVID 19 PNA   Brief History   34 year old female COVID-19 positive with hypoxemic respiratory failure near maxxed on heated high flow prompting pulmonary consult.    Significant Hospital Events   11/26 admit. Oxygen and steroids started 11/27 Baricitinib started 11/28 Remdesivir started  11/29 her oxygen requirements have increased to 30 L high flow at 100% in addition to nonrebreather. PCCM consulted for refractory hypoxemia. 11/30 bilateral LE DVTs therapeutic AC started 12/1-12/3: completed Remdesivir 12/2. 12/3 HR elevated. Slightly hypertensive. But improved spont. Attempting BIPAP at HS and PRN (she was on CPAP at home)   Consults:  PCCM  Procedures:    Significant Diagnostic Tests:    Micro Data:  BCx2 11/26 > Sputum 11/26 >  Antimicrobials:    Interim history/subjective:  Denies pain, shortness of breath about the same  Objective   Blood pressure (Abnormal) 125/58, pulse (Abnormal) 109, temperature 100.2 F (37.9 C), temperature source Oral, resp. rate (Abnormal) 43, height 5\' 5"  (1.651 m), weight (Abnormal) 139.2 kg, last menstrual period 05/07/2020, SpO2 93 %.    FiO2 (%):  [100 %] 100 %   Intake/Output Summary (Last 24 hours) at 05/27/2020 14/08/2019 Last data filed at 05/27/2020 14/08/2019 Gross per 24 hour  Intake 499.52 ml  Output 1000 ml  Net -500.48 ml   Filed Weights   05/20/20 2118  Weight: (Abnormal) 139.2 kg    Examination:  General 34 year old female patient resting in bed she is in no acute distress this morning HEENT normocephalic atraumatic no jugular venous distention appreciated has both high flow mask and nasal cannula in place mucous membranes are moist Pulmonary: Slightly tachypneic with respiratory rate in the mid 30s, no nasal flare or accessory use resting in the upright  position.  Diminished throughout.  Currently oxygen requirements at 100% heated high flow with 40 L as well as nonrebreather mask Cardiac: Regular rate and rhythm Abdomen: Soft nontender no organomegaly Extremities: Warm dry with brisk capillary refill Neuro: Awake oriented no focal deficits.  Resolved Hospital Problem list     Assessment & Plan:   Acute hypoxemic respiratory failure COVID-19 pneumonia OSA on CPAP DM w/ hyperglycemia Obesity     Consulting problem list  Acute hypoxemic respiratory failure COVID-19 pneumonia OSA on CPAP Last CXR 11/26 Oxygen requirements:  Completed Remdesivir  Plan Repeat cxr Cont supplemental oxygen  Cycle NRB and HFNC NIPPV at HS & PRN as atelectasis likely also a contributing factor and w/ her OSA she is typically on positive pressure at night anyway Cont systemic steroids; slow taper initiated Complete 14 days Baricitinib  Encourage self proning Mobilize IS   Acute Bilateral DVT: sharp rise 11/29 >11/30 Plan Cont LMWH .   Best practice (evaluated daily)   Per primary   11-04-1992 ACNP-BC [facility] Pulmonary/Critical Care Pager # [phone] OR # [phone] if no answer   My critical care time is 32 minutes

## 2020-05-27 NOTE — TOC Progression Note (Signed)
Transition of Care [facility]) - Progression Note    Patient Details  Name: Diane Padilla MRN: [id number] Date of Birth: May 30, 1986  Transition of [facility]) CM/SW Contact  Golda Acre, RN Phone Number: 05/27/2020, 8:14 AM  Clinical Narrative:    Continues on hfnc at 40l/min, has finished the remdesivir, on iv solu medrol, crp and d.dimer remain elevated. PLAN: is to return to home with slef care Following for progression.   Expected Discharge Plan: Home/Self Care Barriers to Discharge: No Barriers Identified  Expected Discharge Plan and Services Expected Discharge Plan: Home/Self Care   Discharge Planning Services: CM Consult   Living arrangements for the past 2 months: Apartment                                       Social Determinants of Health (SDOH) Interventions    Readmission Risk Interventions No flowsheet data found.

## 2020-05-27 NOTE — Progress Notes (Signed)
Pharmacy Antibiotic Note  Diane Padilla is a 34 y.o. female admitted on 05/20/2020 with COVID pneumonia. Now on D7 of admission with new fevers and worsening opacities/atalectasis on CXR. Pharmacy has been consulted for vancomycin and cefepime dosing for bacterial PNA r/o.  Plan:  Vancomycin 2000 mg IV now, then 1250 mg IV q12 hr  Measure vancomycin AUC at steady state as indicated  SCr q48 while on vanc  Cefepime 2 g IV q8 hr  MRSA PCR ordered, f/u and narrow vancomycin as appropriate   Height: 5\' 5"  (165.1 cm) Weight: (!) 139.2 kg (306 lb 14.1 oz) IBW/kg (Calculated) : 57  Temp (24hrs), Avg:99.4 F (37.4 C), Min:97.9 F (36.6 C), Max:101.5 F (38.6 C)  Recent Labs  Lab 05/22/20 0904 05/23/20 0330 05/24/20 0326 05/25/20 0322 05/26/20 0319 05/27/20 1505  WBC 12.4* 13.1* 15.8* 16.5*  --  24.8*  CREATININE 0.97 0.78 0.77 0.66 0.65  --     Estimated Creatinine Clearance: 138 mL/min (by C-G formula based on SCr of 0.65 mg/dL).    No Known Allergies  Antimicrobials this admission: 12/3 vanc >>  12/3 cefepime >>   Dose adjustments this admission: n/a  Microbiology results: 12/3 BCx: sent 12/3 MRSA PCR: ordered  Thank you for allowing pharmacy to be a part of this patient's care.  Jakyle Petrucelli A 05/27/2020 4:17 PM

## 2020-05-27 NOTE — Progress Notes (Signed)
PROGRESS NOTE  Diane Padilla  WYO:[id number] DOB: 12-05-1985 DOA: 05/20/2020 PCP: [facility]   Brief Narrative: Diane Padilla is a 34 y.o. female with a history of T2DM, OSA, PCOS, and obesity who presented with shortness of breath found to have covid-19 pneumonia requiring 6L supplemental oxygen which worsened to heated high flow requirement. She was admitted to the ICU/SDU and started on steroids. After initially declining other therapies, remdesivir and baricitinib were started after continued discussions and worsening clinical status. Lower extremity venous U/S revealed bilateral acute DVTs for which therapeutic dose anticoagulation was started on 11/30.   Assessment & Plan: Active Problems:   Type 2 diabetes mellitus with hyperglycemia, without long-term current use of insulin (HCC)   Class 3 severe obesity with serious comorbidity and body mass index (BMI) of 50.0 to 59.9 in adult [facility])   Pneumonia due to COVID-19 virus   Acute respiratory failure due to COVID-19 [facility])   Hyponatremia  Acute hypoxemic respiratory failure due to covid-19 pneumonia: SARS-CoV-2 PCR positive on 11/26.  - Fear her immobility and OSA she is developing worsening atelectasis, decreased sounds at bases from prior exams. Encouraged to use IS and mobilize if possible. Per PCCM, ok to use NIPPV qHS and prn. - S/p remdesivir x5 days (11/28 - 12/2) - Continue steroids, at lower end of typical dosing due to hyperglycemia. CRP responding. - Continue baricitinib (11/27 >> ) x14 days or until DC. - Encourage OOB, IS, FV, and awake proning if able - Continue airborne, contact precautions for 21 days from positive testing. - Monitor CMP and inflammatory markers in AM  ALT elevation: Likely due to covid-19, mild and stable.  - Monitor intermittently.  Acute bilateral peroneal vein DVTs: - Continue lovenox 1mg /kg q12h.   T2DM with steroid-induced hyperglycemia:  - Continue  lantus, increase further to 28u daily. Continue resistant SSI and increase novolog to 5u TIDWC, continue HS coverage. No hypoglycemia noted. Fasting CBG improving but not at goal. - Linagliptin, continue  Morbid obesity: Estimated body mass index is 51.07 kg/m as calculated from the following:   Height as of this encounter: 5\' 5"  (1.651 m).   Weight as of this encounter: 139.2 kg.  DVT prophylaxis: Therapeutic lovenox Code Status: Full Family Communication: Husband and mother by phone.  Disposition Plan:  Status is: Inpatient  Remains inpatient appropriate because:Inpatient level of care appropriate due to severity of illness  Dispo: The patient is from: Home              Anticipated d/c is to: Home              Anticipated d/c date is: > 3 days              Patient currently is not medically stable to d/c.  Consultants:   PCCM  Procedures:   None  Antimicrobials:  Remdesivir 11/28 - 12/2   Subjective: Some tachycardia and hypertension this AM with low grade fever, felt chills. Shortness of breath is severe, constant, stable. No chest pain. Not sleeping well at all. .   Objective: Vitals:   05/27/20 0735 05/27/20 0741 05/27/20 0746 05/27/20 0817  BP:   (!) 164/95   Pulse:  (!) 134 (!) 136 (!) 140  Resp:  (!) 38 (!) 50 (!) 46  Temp:  100.2 F (37.9 C)    TempSrc:  Oral    SpO2: 90% (!) 88% (!) 89% 91%  Weight:  Height:        Intake/Output Summary (Last 24 hours) at 05/27/2020 [id number] Last data filed at 05/27/2020 0000 Gross per 24 hour  Intake 259.52 ml  Output 1000 ml  Net -740.48 ml   Filed Weights   05/20/20 2118  Weight: (!) 139.2 kg   Gen: Tired-appearing female in no distress Pulm: Tachypneic at rest. Very diminished, worse than prior exams, crackles audible posteriorly. No wheezing CV: Regular tachycardia. No murmur, rub, or gallop. No JVD, no pitting dependent edema. GI: Abdomen soft, non-tender, non-distended, with normoactive bowel sounds.   Ext: Warm, no deformities Skin: No rashes, lesions or ulcers on visualized skin. Neuro: Alert and oriented. No focal neurological deficits. Psych: Judgement and insight appear fair. Mood euthymic & affect congruent. Behavior is appropriate.    Data Reviewed: I have personally reviewed following labs and imaging studies  CBC: Recent Labs  Lab 05/21/20 0438 05/22/20 0904 05/23/20 0330 05/24/20 0326 05/25/20 0322  WBC 7.3 12.4* 13.1* 15.8* 16.5*  NEUTROABS 5.7 9.8* 10.9* 13.0* 14.1*  HGB 12.5 12.4 12.4 12.1 12.8  HCT 37.3 37.6 37.2 37.8 39.3  MCV 81.3 82.3 81.9 84.4 82.4  PLT 322 368 365 269 319   Basic Metabolic Panel: Recent Labs  Lab 05/21/20 0438 05/21/20 0438 05/22/20 0904 05/23/20 0330 05/24/20 0326 05/25/20 0322 05/26/20 0319  NA 136   < > 137 137 139 137 136  K 4.1   < > 4.2 4.5 4.8 4.7 4.9  CL 102   < > 100 101 103 100 102  CO2 24   < > 25 24 25 28 23   GLUCOSE 216*   < > 221* 254* 270* 235* 240*  BUN 9   < > 12 13 18  22* 19  CREATININE 0.80   < > 0.97 0.78 0.77 0.66 0.65  CALCIUM 8.5*   < > 8.4* 8.5* 8.4* 8.5* 8.7*  MG 2.4  --  2.5* 2.9* 3.1* 2.8*  --   PHOS 2.5  --  3.3 3.4 3.7 4.1  --    < > = values in this interval not displayed.   GFR: Estimated Creatinine Clearance: 138 mL/min (by C-G formula based on SCr of 0.65 mg/dL). Liver Function Tests: Recent Labs  Lab 05/22/20 0904 05/23/20 0330 05/24/20 0326 05/25/20 0322 05/26/20 0319  AST 94* 129* 70* 41 38  ALT 65* 123* 99* 79* 77*  ALKPHOS 66 76 83 96 89  BILITOT 0.5 0.6 0.4 0.8 0.7  PROT 8.0 7.9 7.3 7.5 7.4  ALBUMIN 3.2* 2.9* 2.8* 2.9* 2.9*   CBG: Recent Labs  Lab 05/26/20 1227 05/26/20 1640 05/26/20 2043 05/26/20 2340 05/27/20 0733  GLUCAP 250* 229* 214* 231* 187*   Anemia Panel: Recent Labs    05/25/20 0322  FERRITIN 543*   Urine analysis:    Component Value Date/Time   BILIRUBINUR negative 08/19/2018 1543   PROTEINUR Negative 08/19/2018 1543   UROBILINOGEN 0.2 08/19/2018  1543   NITRITE negative 08/19/2018 1543   LEUKOCYTESUR Moderate (2+) (A) 08/19/2018 1543   Recent Results (from the past 240 hour(s))  Resp Panel by RT-PCR (Flu A&B, Covid) Nasopharyngeal Swab     Status: Abnormal   Collection Time: 05/20/20  2:59 PM   Specimen: Nasopharyngeal Swab; Nasopharyngeal(NP) swabs in vial transport medium  Result Value Ref Range Status   SARS Coronavirus 2 by RT PCR POSITIVE (A) NEGATIVE Final    Comment: RESULT CALLED TO, READ BACK BY AND VERIFIED WITH: Jasper LoserLAMB, S RN 1709 05/20/20 JM (  NOTE) SARS-CoV-2 target nucleic acids are DETECTED.  The SARS-CoV-2 RNA is generally detectable in upper respiratory specimens during the acute phase of infection. Positive results are indicative of the presence of the identified virus, but do not rule out bacterial infection or co-infection with other pathogens not detected by the test. Clinical correlation with patient history and other diagnostic information is necessary to determine patient infection status. The expected result is Negative.  Fact Sheet for Patients: BloggerCourse.com  Fact Sheet for Healthcare Providers: SeriousBroker.it  This test is not yet approved or cleared by the [place] FDA and  has been authorized for detection and/or diagnosis of SARS-CoV-2 by FDA under an Emergency Use Authorization (EUA).  This EUA will remain in effect (meaning this test can be used)  for the duration of  the COVID-19 declaration under Section 564(b)(1) of the Act, 21 U.S.C. section 360bbb-3(b)(1), unless the authorization is terminated or revoked sooner.     Influenza A by PCR NEGATIVE NEGATIVE Final   Influenza B by PCR NEGATIVE NEGATIVE Final    Comment: (NOTE) The Xpert Xpress SARS-CoV-2/FLU/RSV plus assay is intended as an aid in the diagnosis of influenza from Nasopharyngeal swab specimens and should not be used as a sole basis for treatment. Nasal washings  and aspirates are unacceptable for Xpert Xpress SARS-CoV-2/FLU/RSV testing.  Fact Sheet for Patients: BloggerCourse.com  Fact Sheet for Healthcare Providers: SeriousBroker.it  This test is not yet approved or cleared by the [place] FDA and has been authorized for detection and/or diagnosis of SARS-CoV-2 by FDA under an Emergency Use Authorization (EUA). This EUA will remain in effect (meaning this test can be used) for the duration of the COVID-19 declaration under Section 564(b)(1) of the Act, 21 U.S.C. section 360bbb-3(b)(1), unless the authorization is terminated or revoked.  Performed at [facility], [address], [place], [place] [address]   Blood Culture (routine x 2)     Status: None   Collection Time: 05/20/20  2:59 PM   Specimen: BLOOD  Result Value Ref Range Status   Specimen Description   Final    BLOOD SITE NOT SPECIFIED Performed at [facility], [address], [place], [place] [address]    Special Requests   Final    BOTTLES DRAWN AEROBIC AND ANAEROBIC Blood Culture adequate volume Performed at [facility], [address], [place], [place] [address]    Culture   Final    NO GROWTH 5 DAYS Performed at [facility], [address] [place], [place] [address]    Report Status 05/25/2020 FINAL  Final  Blood Culture (routine x 2)     Status: None   Collection Time: 05/20/20  4:24 PM   Specimen: BLOOD RIGHT HAND  Result Value Ref Range Status   Specimen Description   Final    BLOOD RIGHT HAND Performed at [facility], [address], [place], [place] [address]    Special Requests   Final    BOTTLES DRAWN AEROBIC AND ANAEROBIC Blood Culture adequate volume Performed at [facility], [address] [place], [place] [address]    Culture   Final    NO GROWTH 5 DAYS Performed at [facility], [address], [place], [place] [address]    Report Status 05/25/2020 FINAL  Final  MRSA PCR Screening     Status: None   Collection Time: 05/22/20  3:32 AM   Specimen: Nasal Mucosa;  Nasopharyngeal  Result Value Ref Range Status   MRSA by PCR NEGATIVE NEGATIVE Final    Comment:        The GeneXpert MRSA Assay (FDA approved for NASAL specimens only), is one component of a comprehensive MRSA colonization surveillance program. It is not intended to diagnose MRSA infection nor to guide or monitor treatment for MRSA infections. Performed at [facility], [address], [place], [place] [address]       Radiology Studies: No results found.  Scheduled Meds: . (feeding supplement) PROSource Plus  30 mL Oral TID BM  . albuterol  2 puff Inhalation Q4H  . baricitinib  4 mg Oral Daily  . Chlorhexidine Gluconate Cloth  6 each Topical Q0600  . enoxaparin (LOVENOX) injection  140 mg Subcutaneous Q12H  . hydrALAZINE  25 mg Oral Q8H  . insulin aspart  0-20 Units Subcutaneous TID WC  . insulin aspart  0-5 Units Subcutaneous QHS  . insulin aspart  3 Units Subcutaneous TID WC  . insulin glargine  25 Units Subcutaneous Daily  . linagliptin  5 mg Oral Daily  . mouth rinse  15 mL Mouth Rinse BID  . melatonin  5 mg Oral QHS  . methylPREDNISolone (SOLU-MEDROL) injection  60 mg Intravenous Q12H  . metoprolol tartrate  50 mg Oral BID  . multivitamin with minerals  1 tablet Oral Daily  . sodium chloride flush  3 mL Intravenous Q12H  . sodium chloride flush  3 mL Intravenous Q12H   Continuous Infusions: . sodium chloride Stopped (05/26/20 1130)     LOS: 7 days   Time spent: 35 minutes.  Tyrone Nine, MD [facility] [email or web address] 05/27/2020, 8:32 AM

## 2020-05-27 NOTE — Progress Notes (Signed)
Pts HR sustaining at 130s-140s at rest, BP also 164/95. Pt denies pain, and parameters not met for PRN tylenol. RR remains elevated in the 40s-50s, pt appears comfortable in bed. Dr. Bonner Puna paged by this RN; awaiting orders at this time. RN will continue to closely monitor.

## 2020-05-28 DIAGNOSIS — U071 COVID-19: Secondary | ICD-10-CM | POA: Diagnosis not present

## 2020-05-28 DIAGNOSIS — E871 Hypo-osmolality and hyponatremia: Secondary | ICD-10-CM | POA: Diagnosis not present

## 2020-05-28 DIAGNOSIS — E1165 Type 2 diabetes mellitus with hyperglycemia: Secondary | ICD-10-CM | POA: Diagnosis not present

## 2020-05-28 LAB — COMPREHENSIVE METABOLIC PANEL
ALT: 155 U/L — ABNORMAL HIGH (ref 0–44)
AST: 59 U/L — ABNORMAL HIGH (ref 15–41)
Albumin: 3.1 g/dL — ABNORMAL LOW (ref 3.5–5.0)
Alkaline Phosphatase: 100 U/L (ref 38–126)
Anion gap: 11 (ref 5–15)
BUN: 22 mg/dL — ABNORMAL HIGH (ref 6–20)
CO2: 21 mmol/L — ABNORMAL LOW (ref 22–32)
Calcium: 8.8 mg/dL — ABNORMAL LOW (ref 8.9–10.3)
Chloride: 101 mmol/L (ref 98–111)
Creatinine, Ser: 0.68 mg/dL (ref 0.44–1.00)
GFR, Estimated: >60 mL/min (ref >60)
Glucose, Bld: 201 mg/dL — ABNORMAL HIGH (ref 70–99)
Potassium: 4.7 mmol/L (ref 3.5–5.1)
Sodium: 133 mmol/L — ABNORMAL LOW (ref 135–145)
Total Bilirubin: 0.8 mg/dL (ref 0.3–1.2)
Total Protein: 8.1 g/dL (ref 6.5–8.1)

## 2020-05-28 LAB — GLUCOSE, CAPILLARY
Glucose-Capillary: 211 mg/dL — ABNORMAL HIGH (ref 70–99)
Glucose-Capillary: 183 mg/dL — ABNORMAL HIGH (ref 70–99)
Glucose-Capillary: 297 mg/dL — ABNORMAL HIGH (ref 70–99)
Glucose-Capillary: 248 mg/dL — ABNORMAL HIGH (ref 70–99)
Glucose-Capillary: 207 mg/dL — ABNORMAL HIGH (ref 70–99)
Glucose-Capillary: 290 mg/dL — ABNORMAL HIGH (ref 70–99)

## 2020-05-28 LAB — CBC
HCT: 41.8 % (ref 36.0–46.0)
Hemoglobin: 13.7 g/dL (ref 12.0–15.0)
MCH: 27.2 pg (ref 26.0–34.0)
MCHC: 32.8 g/dL (ref 30.0–36.0)
MCV: 82.9 fL (ref 80.0–100.0)
Platelets: 447 10*3/uL — ABNORMAL HIGH (ref 150–400)
RBC: 5.04 MIL/uL (ref 3.87–5.11)
RDW: 14.3 % (ref 11.5–15.5)
WBC: 25.3 10*3/uL — ABNORMAL HIGH (ref 4.0–10.5)
nRBC: 0 % (ref 0.0–0.2)

## 2020-05-28 LAB — C-REACTIVE PROTEIN: CRP: 10 mg/dL — ABNORMAL HIGH (ref <1.0)

## 2020-05-28 MED ORDER — INSULIN GLARGINE 100 UNIT/ML ~~LOC~~ SOLN
35.0000 [IU] | Freq: Every day | SUBCUTANEOUS | Status: DC
  Administered 2020-05-28: 35 [IU] via SUBCUTANEOUS
  Filled 2020-05-28 (×2): qty 0.35

## 2020-05-28 MED ORDER — LIP MEDEX EX OINT
1.0000 "application " | TOPICAL_OINTMENT | CUTANEOUS | Status: DC | PRN
  Administered 2020-05-28: 1 via TOPICAL
  Filled 2020-05-28: qty 7

## 2020-05-28 MED ORDER — NEPRO/CARBSTEADY PO LIQD
237.0000 mL | Freq: Three times a day (TID) | ORAL | Status: DC
  Administered 2020-05-28 – 2020-06-10 (×36): 237 mL via ORAL
  Filled 2020-05-28 (×46): qty 237

## 2020-05-28 MED ORDER — INSULIN ASPART 100 UNIT/ML ~~LOC~~ SOLN
6.0000 [IU] | Freq: Three times a day (TID) | SUBCUTANEOUS | Status: DC
  Administered 2020-05-28 – 2020-06-01 (×13): 6 [IU] via SUBCUTANEOUS

## 2020-05-28 NOTE — Progress Notes (Signed)
PROGRESS NOTE  Diane Padilla  ZOX:[id number] DOB: July 28, 1985 DOA: 05/20/2020 PCP: [facility]   Brief Narrative: Diane Padilla is a 34 y.o. female with a history of T2DM, OSA, PCOS, and obesity who presented with shortness of breath found to have covid-19 pneumonia requiring 6L supplemental oxygen which worsened to heated high flow requirement. She was admitted to the ICU/SDU and started on steroids. After initially declining other therapies, remdesivir and baricitinib were started after continued discussions and worsening clinical status. Lower extremity venous U/S revealed bilateral acute DVTs for which therapeutic dose anticoagulation was started on 11/30. Hypoxia has remained severe. Antibiotics empirically added 12/3.  Assessment & Plan: Active Problems:   Type 2 diabetes mellitus with hyperglycemia, without long-term current use of insulin (HCC)   Class 3 severe obesity with serious comorbidity and body mass index (BMI) of 50.0 to 59.9 in adult [facility])   Pneumonia due to COVID-19 virus   Acute hypoxemic respiratory failure due to COVID-19 [facility])   Hyponatremia   Acute respiratory failure with hypoxia (HCC)  Acute hypoxemic respiratory failure due to covid-19 pneumonia with concern for superimposed bacterial pneumonia: SARS-CoV-2 PCR positive on 11/26.  - Fear her immobility and OSA she is developing worsening atelectasis, decreased sounds at bases from prior exams. Encouraged to use IS and mobilize if possible. Per PCCM, ok to use NIPPV qHS and prn. - S/p remdesivir x5 days (11/28 - 12/2) - Continue steroids - Continue baricitinib (11/27 >> ) x14 days or until DC. - With resurgence of CRP, new fever, use of immunomodulator, concern for bacterial pneumonia on 12/3, started vanc/cefepime due to hospital acquisition. - Encourage OOB, IS, FV, and awake proning if able - Continue airborne, contact precautions for 21 days from positive testing. - Monitor  CMP and inflammatory markers in AM  ALT elevation: Likely due to covid-19  - Monitor intermittently.  Acute bilateral peroneal vein DVTs: - Continue lovenox /kg q12h.   T2DM with steroid-induced hyperglycemia:  - Further augmenting insulins today based on hyperglycemia and no hypoglycemia. - Linagliptin, continue  Morbid obesity: Estimated body mass index is 51.07 kg/m as calculated from the following:   Height as of this encounter:  (1.651 m).   Weight as of this encounter: 139.2 kg.  DVT prophylaxis: Therapeutic lovenox Code Status: Full Family Communication: Husband and mother by phone daily.  Disposition Plan:  Status is: Inpatient; remain in ICU. I remain concerned with protracted severe hypoxia and tachypnea that patient is at risk of intubation.  Remains inpatient appropriate because:Inpatient level of care appropriate due to severity of illness  Dispo: The patient is from: Home              Anticipated d/c is to: Home              Anticipated d/c date is: > 3 days              Patient currently is not medically stable to d/c.  Consultants:   PCCM  Procedures:   None  Antimicrobials:  Remdesivir 11/28 - 12/2   Subjective: Up to chair today, didn't sleep well last night again. Shortness of breath at rest, severe with exertion. No chest pain or bleeding. Denies palpitations.   Objective: Vitals:   05/28/20 0946 05/28/20 1000 05/28/20 1137 05/28/20 1200  BP: (!) 162/82 (!) 158/93 (!) 142/78 140/84  Pulse: (!) 126 (!) 133 (!) 112 99  Resp:  (!) 48 (!) 37 (!) 33  Temp:   99.5 F (37.5 C)   TempSrc:   Axillary   SpO2:  (!) 89% 97% 94%  Weight:      Height:        Intake/Output Summary (Last 24 hours) at 05/28/2020 1217 Last data filed at 05/28/2020 [id number] Gross per 24 hour  Intake 1728.04 ml  Output 300 ml  Net 1428.04 ml   Filed Weights   05/20/20 2118  Weight: (!) 139.2 kg   Gen: 34 y.o. female in no distress Pulm: Nonlabored, tachypneic  steady in 40's, shallow inspiration, diminished throughout. CV: Regular tachycardia. No murmur, rub, or gallop. No JVD, no dependent edema. GI: Abdomen soft, non-tender, non-distended, with normoactive bowel sounds.  Ext: Warm, no deformities Skin: No rashes, lesions or ulcers on visualized skin. Neuro: Alert and oriented. No focal neurological deficits. Psych: Judgement and insight appear fair. Mood euthymic & affect congruent. Behavior is appropriate.    Data Reviewed: I have personally reviewed following labs and imaging studies  CBC: Recent Labs  Lab 05/22/20 0904 05/22/20 0904 05/23/20 0330 05/24/20 0326 05/25/20 0322 05/27/20 1505 05/28/20 0319  WBC 12.4*   < > 13.1* 15.8* 16.5* 24.8* 25.3*  NEUTROABS 9.8*  --  10.9* 13.0* 14.1* 21.6*  --   HGB 12.4   < > 12.4 12.1 12.8 14.1 13.7  HCT 37.6   < > 37.2 37.8 39.3 42.6 41.8  MCV 82.3   < > 81.9 84.4 82.4 82.4 82.9  PLT 368   < > 365 269 319 441* 447*   < > = values in this interval not displayed.   Basic Metabolic Panel: Recent Labs  Lab 05/22/20 0904 05/22/20 0904 05/23/20 0330 05/24/20 0326 05/25/20 0322 05/26/20 0319 05/28/20 0319  NA 137   < > 137 139 137 136 133*  K 4.2   < > 4.5 4.8 4.7 4.9 4.7  CL 100   < > 101 103 100 102 101  CO2 25   < > 24 25 28 23  21*  GLUCOSE 221*   < > 254* 270* 235* 240* 201*  BUN 12   < > 13 18 22* 19 22*  CREATININE 0.97   < > 0.78 0.77 0.66 0.65 0.68  CALCIUM 8.4*   < > 8.5* 8.4* 8.5* 8.7* 8.8*  MG 2.5*  --  2.9* 3.1* 2.8*  --   --   PHOS 3.3  --  3.4 3.7 4.1  --   --    < > = values in this interval not displayed.   GFR: Estimated Creatinine Clearance: 138 mL/min (by C-G formula based on SCr of 0.68 mg/dL). Liver Function Tests: Recent Labs  Lab 05/23/20 0330 05/24/20 0326 05/25/20 0322 05/26/20 0319 05/28/20 0319  AST 129* 70* 41 38 59*  ALT 123* 99* 79* 77* 155*  ALKPHOS 76 83 96 89 100  BILITOT 0.6 0.4 0.8 0.7 0.8  PROT 7.9 7.3 7.5 7.4 8.1  ALBUMIN 2.9* 2.8*  2.9* 2.9* 3.1*   CBG: Recent Labs  Lab 05/27/20 2001 05/27/20 2325 05/28/20 0319 05/28/20 0749 05/28/20 1132  GLUCAP 215* 227* 211* 183* 297*   Anemia Panel: No results for input(s): VITAMINB12, FOLATE, FERRITIN, TIBC, IRON, RETICCTPCT in the last 72 hours. Urine analysis:    Component Value Date/Time   BILIRUBINUR negative 08/19/2018 1543   PROTEINUR Negative 08/19/2018 1543   UROBILINOGEN 0.2 08/19/2018 1543   NITRITE negative 08/19/2018 1543   LEUKOCYTESUR Moderate (2+) (A) 08/19/2018 1543   Recent Results (from  the past 240 hour(s))  Resp Panel by RT-PCR (Flu A&B, Covid) Nasopharyngeal Swab     Status: Abnormal   Collection Time: 05/20/20  2:59 PM   Specimen: Nasopharyngeal Swab; Nasopharyngeal(NP) swabs in vial transport medium  Result Value Ref Range Status   SARS Coronavirus 2 by RT PCR POSITIVE (A) NEGATIVE Final    Comment: RESULT CALLED TO, READ BACK BY AND VERIFIED WITH: LAMB, S RN 1709 05/20/20 JM (NOTE) SARS-CoV-2 target nucleic acids are DETECTED.  The SARS-CoV-2 RNA is generally detectable in upper respiratory specimens during the acute phase of infection. Positive results are indicative of the presence of the identified virus, but do not rule out bacterial infection or co-infection with other pathogens not detected by the test. Clinical correlation with patient history and other diagnostic information is necessary to determine patient infection status. The expected result is Negative.  Fact Sheet for Patients: BloggerCourse.com  Fact Sheet for Healthcare Providers: SeriousBroker.it  This test is not yet approved or cleared by the [place] FDA and  has been authorized for detection and/or diagnosis of SARS-CoV-2 by FDA under an Emergency Use Authorization (EUA).  This EUA will remain in effect (meaning this test can be used)  for the duration of  the COVID-19 declaration under Section 564(b)(1) of  the Act, 21 U.S.C. section 360bbb-3(b)(1), unless the authorization is terminated or revoked sooner.     Influenza A by PCR NEGATIVE NEGATIVE Final   Influenza B by PCR NEGATIVE NEGATIVE Final    Comment: (NOTE) The Xpert Xpress SARS-CoV-2/FLU/RSV plus assay is intended as an aid in the diagnosis of influenza from Nasopharyngeal swab specimens and should not be used as a sole basis for treatment. Nasal washings and aspirates are unacceptable for Xpert Xpress SARS-CoV-2/FLU/RSV testing.  Fact Sheet for Patients: BloggerCourse.com  Fact Sheet for Healthcare Providers: SeriousBroker.it  This test is not yet approved or cleared by the [place] FDA and has been authorized for detection and/or diagnosis of SARS-CoV-2 by FDA under an Emergency Use Authorization (EUA). This EUA will remain in effect (meaning this test can be used) for the duration of the COVID-19 declaration under Section 564(b)(1) of the Act, 21 U.S.C. section 360bbb-3(b)(1), unless the authorization is terminated or revoked.  Performed at [facility], [address] [place], [place] [address]   Blood Culture (routine x 2)     Status: None   Collection Time: 05/20/20  2:59 PM   Specimen: BLOOD  Result Value Ref Range Status   Specimen Description   Final    BLOOD SITE NOT SPECIFIED Performed at [facility], [address], [place], [place] [address]    Special Requests   Final    BOTTLES DRAWN AEROBIC AND ANAEROBIC Blood Culture adequate volume Performed at [facility], [address], [place], [place] [address]    Culture   Final    NO GROWTH 5 DAYS Performed at [facility], [address] [place], [place] [address]    Report Status 05/25/2020 FINAL  Final  Blood Culture (routine x 2)     Status: None   Collection Time: 05/20/20  4:24 PM   Specimen: BLOOD RIGHT HAND  Result Value Ref Range Status    Specimen Description   Final    BLOOD RIGHT HAND Performed at [facility], [address] [place], [place] [address]    Special Requests   Final    BOTTLES DRAWN AEROBIC AND ANAEROBIC  Blood Culture adequate volume Performed at [facility], [address], [place], [place] [address]    Culture   Final    NO GROWTH 5 DAYS Performed at [facility], [address], [place], [place] [address]    Report Status 05/25/2020 FINAL  Final  MRSA PCR Screening     Status: None   Collection Time: 05/22/20  3:32 AM   Specimen: Nasal Mucosa; Nasopharyngeal  Result Value Ref Range Status   MRSA by PCR NEGATIVE NEGATIVE Final    Comment:        The GeneXpert MRSA Assay (FDA approved for NASAL specimens only), is one component of a comprehensive MRSA colonization surveillance program. It is not intended to diagnose MRSA infection nor to guide or monitor treatment for MRSA infections. Performed at [facility], [address] [place], [place] [address]   Culture, blood (routine x 2)     Status: None (Preliminary result)   Collection Time: 05/27/20  3:05 PM   Specimen: BLOOD  Result Value Ref Range Status   Specimen Description   Final    BLOOD LEFT ANTECUBITAL Performed at [facility], [address], [place], [place] [address]    Special Requests   Final    BOTTLES DRAWN AEROBIC ONLY Blood Culture adequate volume Performed at [facility], [address], [place], [place] [address]    Culture   Final    NO GROWTH < 24 HOURS Performed at [facility], [address], [place], [place] [address]    Report Status PENDING  Incomplete  Culture, blood (routine x 2)     Status: None (Preliminary result)   Collection Time: 05/27/20  3:05 PM   Specimen: BLOOD LEFT HAND  Result Value Ref Range Status   Specimen Description   Final    BLOOD LEFT HAND Performed at [facility], [address] [place], [place] [address]    Special Requests   Final    BOTTLES DRAWN AEROBIC ONLY Blood Culture adequate volume Performed at [facility], [address] [place], [place] [address]    Culture   Final    NO GROWTH < 24 HOURS Performed at [facility], [address], [place], [place] [address]    Report Status PENDING  Incomplete  MRSA PCR Screening     Status: None   Collection Time: 05/27/20  6:00 PM   Specimen: Nasal Mucosa; Nasopharyngeal  Result Value Ref Range Status   MRSA by PCR NEGATIVE NEGATIVE Final    Comment:        The GeneXpert MRSA Assay (FDA approved for NASAL specimens only), is one component of a comprehensive MRSA colonization surveillance program. It is not intended to diagnose MRSA infection nor to guide or monitor treatment for MRSA infections. Performed at [facility], [address], [place], [place] [address]       Radiology Studies: DG CHEST PORT 1 VIEW  Result Date: 05/27/2020 CLINICAL DATA:  COVID-19 positive EXAM: PORTABLE CHEST 1 VIEW COMPARISON:  Radiograph 05/20/2020 FINDINGS: Heterogeneous bilateral airspace opacities superimposed on diminishing lung volumes and increased atelectatic volume loss. No pneumothorax or visible effusion. Stable cardiomediastinal contours. No acute osseous or soft tissue abnormality. Telemetry leads overlie the chest. IMPRESSION: Persistent heterogeneous bilateral airspace opacities superimposed on some worsening atelectatic change. Electronically Signed   By: Kreg Shropshire M.D.   On: 05/27/2020 16:52  Scheduled Meds:  (feeding supplement) PROSource Plus  30 mL Oral TID BM   albuterol  2 puff Inhalation Q4H   baricitinib  4 mg Oral Daily   Chlorhexidine Gluconate Cloth  6 each Topical Q0600   enoxaparin (LOVENOX) injection  140 mg Subcutaneous Q12H   feeding supplement (NEPRO CARB STEADY)  237 mL Oral TID BM   hydrALAZINE   25 mg Oral Q8H   insulin aspart  0-20 Units Subcutaneous TID WC   insulin aspart  0-5 Units Subcutaneous QHS   insulin aspart  6 Units Subcutaneous TID WC   insulin glargine  35 Units Subcutaneous Daily   linagliptin  5 mg Oral Daily   mouth rinse  15 mL Mouth Rinse BID   melatonin  5 mg Oral QHS   methylPREDNISolone (SOLU-MEDROL) injection  80 mg Intravenous Q12H   metoprolol tartrate  50 mg Oral BID   multivitamin with minerals  1 tablet Oral Daily   sodium chloride flush  3 mL Intravenous Q12H   sodium chloride flush  3 mL Intravenous Q12H   Continuous Infusions:  sodium chloride Stopped (05/26/20 1130)   ceFEPime (MAXIPIME) IV Stopped (05/28/20 0558)     LOS: 8 days   Time spent: 35 minutes.  Tyrone Nine, MD [facility] [email or web address] 05/28/2020, 12:17 PM

## 2020-05-28 NOTE — Progress Notes (Signed)
[facility] PCCM  Chart reviewed Charted vital signs and FiO2 stable Will plan to see again Monday but can see sooner if appropriate  [place], MD [facility] Pager: [phone] Cell: [phone] If no response, call [phone]

## 2020-05-28 NOTE — Progress Notes (Signed)
Tried off PRB- mouth breathing and resulted in low Sp02.

## 2020-05-29 ENCOUNTER — Encounter (HOSPITAL_COMMUNITY): Payer: Self-pay | Admitting: Internal Medicine

## 2020-05-29 ENCOUNTER — Inpatient Hospital Stay (HOSPITAL_COMMUNITY): Payer: BC Managed Care – PPO

## 2020-05-29 DIAGNOSIS — Z6841 Body Mass Index (BMI) 40.0 and over, adult: Secondary | ICD-10-CM

## 2020-05-29 DIAGNOSIS — I824Z9 Acute embolism and thrombosis of unspecified deep veins of unspecified distal lower extremity: Secondary | ICD-10-CM

## 2020-05-29 DIAGNOSIS — U071 COVID-19: Secondary | ICD-10-CM | POA: Diagnosis not present

## 2020-05-29 DIAGNOSIS — I824Z3 Acute embolism and thrombosis of unspecified deep veins of distal lower extremity, bilateral: Secondary | ICD-10-CM | POA: Diagnosis not present

## 2020-05-29 DIAGNOSIS — J9601 Acute respiratory failure with hypoxia: Secondary | ICD-10-CM | POA: Diagnosis not present

## 2020-05-29 LAB — COMPREHENSIVE METABOLIC PANEL
ALT: 172 U/L — ABNORMAL HIGH (ref 0–44)
AST: 54 U/L — ABNORMAL HIGH (ref 15–41)
Albumin: 3 g/dL — ABNORMAL LOW (ref 3.5–5.0)
Alkaline Phosphatase: 103 U/L (ref 38–126)
Anion gap: 11 (ref 5–15)
BUN: 24 mg/dL — ABNORMAL HIGH (ref 6–20)
CO2: 22 mmol/L (ref 22–32)
Calcium: 8.9 mg/dL (ref 8.9–10.3)
Chloride: 101 mmol/L (ref 98–111)
Creatinine, Ser: 0.69 mg/dL (ref 0.44–1.00)
GFR, Estimated: >60 mL/min (ref >60)
Glucose, Bld: 258 mg/dL — ABNORMAL HIGH (ref 70–99)
Potassium: 5.1 mmol/L (ref 3.5–5.1)
Sodium: 134 mmol/L — ABNORMAL LOW (ref 135–145)
Total Bilirubin: 0.5 mg/dL (ref 0.3–1.2)
Total Protein: 7.5 g/dL (ref 6.5–8.1)

## 2020-05-29 LAB — GLUCOSE, CAPILLARY
Glucose-Capillary: 195 mg/dL — ABNORMAL HIGH (ref 70–99)
Glucose-Capillary: 242 mg/dL — ABNORMAL HIGH (ref 70–99)
Glucose-Capillary: 257 mg/dL — ABNORMAL HIGH (ref 70–99)
Glucose-Capillary: 263 mg/dL — ABNORMAL HIGH (ref 70–99)
Glucose-Capillary: 292 mg/dL — ABNORMAL HIGH (ref 70–99)
Glucose-Capillary: 356 mg/dL — ABNORMAL HIGH (ref 70–99)

## 2020-05-29 LAB — CBC
HCT: 39.1 % (ref 36.0–46.0)
Hemoglobin: 12.5 g/dL (ref 12.0–15.0)
MCH: 26.8 pg (ref 26.0–34.0)
MCHC: 32 g/dL (ref 30.0–36.0)
MCV: 83.7 fL (ref 80.0–100.0)
Platelets: 497 10*3/uL — ABNORMAL HIGH (ref 150–400)
RBC: 4.67 MIL/uL (ref 3.87–5.11)
RDW: 14.2 % (ref 11.5–15.5)
WBC: 23.5 10*3/uL — ABNORMAL HIGH (ref 4.0–10.5)
nRBC: 0 % (ref 0.0–0.2)

## 2020-05-29 LAB — D-DIMER, QUANTITATIVE: D-Dimer, Quant: 6.34 ug/mL-FEU — ABNORMAL HIGH (ref 0.00–0.50)

## 2020-05-29 LAB — C-REACTIVE PROTEIN
CRP: 5 mg/dL — ABNORMAL HIGH (ref <1.0)
CRP: 4.3 mg/dL — ABNORMAL HIGH (ref <1.0)

## 2020-05-29 MED ORDER — HEPARIN (PORCINE) 25000 UT/250ML-% IV SOLN
1600.0000 [IU]/h | INTRAVENOUS | Status: DC
  Administered 2020-05-29: 1600 [IU]/h via INTRAVENOUS
  Filled 2020-05-29: qty 250

## 2020-05-29 MED ORDER — IOHEXOL 350 MG/ML SOLN
100.0000 mL | Freq: Once | INTRAVENOUS | Status: AC | PRN
  Administered 2020-05-29: 100 mL via INTRAVENOUS

## 2020-05-29 MED ORDER — INSULIN GLARGINE 100 UNIT/ML ~~LOC~~ SOLN
20.0000 [IU] | Freq: Two times a day (BID) | SUBCUTANEOUS | Status: DC
  Administered 2020-05-29 (×2): 20 [IU] via SUBCUTANEOUS
  Filled 2020-05-29 (×3): qty 0.2

## 2020-05-29 MED ORDER — METHYLPREDNISOLONE SODIUM SUCC 40 MG IJ SOLR
40.0000 mg | Freq: Two times a day (BID) | INTRAMUSCULAR | Status: DC
  Administered 2020-05-29 – 2020-05-30 (×2): 40 mg via INTRAVENOUS
  Filled 2020-05-29 (×2): qty 1

## 2020-05-29 NOTE — Progress Notes (Addendum)
[facility] PROGRESS NOTE    Progress Note  Diane Padilla  JXB:[id number] DOB: Oct 22, 1985 DOA: 05/20/2020 PCP: [facility]     Brief Narrative:   Diane Padilla is an 34 y.o. female past medical history of diabetes mellitus type 2 obstructive sleep apnea obesity who presents with shortness of breath was found to be COVID-19 positive requiring 16 L was admitted to the ICU on steroids initially declined other therapies, after discussion and her declining clinical status she agreed to IV remdesivir and baricitinib, lower extremity Doppler was positive for acute DVTs she was started on Lovenox 05/24/2020 she has remained severely hypoxic.  Antibiotics were started on December 3.  Assessment/Plan:   Acute respiratory failure with hypoxia due to COVID-19 pneumonia/and possibly ARDS: He is currently requiring FiO2 of 100% and a flow rate of 30 satting greater than 94%.  She is definitely in ARDS did not tolerated the BiPAP yesterday.  Critical care is on board appreciate assistance. Her inflammatory markers are persistently elevated, she was started on IV Solu-Medrol on admission as she worsen she agreed to IV remdesivir and baricitinib. She has completed 10-day course of IV steroids will start to wean down. Worsening inflammatory markers and new fever she was started empirically on vancomycin and cefepime for possible hospital-acquired pneumonia on 05/27/2020. We will need to be in airborne precaution for at least 21 days.  Transaminitis: Likely due to COVID-19.  Acute bilateral peroneal lower extremity DVT: She was started on Lovenox 1 mg/kg every 12 hours. She is persistently hypoxic tachycardic at tachypneic, will get a CT angio of the chest and will transition her to IV heparin, check a D-dimer and CRP.  Type 2 diabetes mellitus with hyperglycemia, without long-term current use of insulin (HCC) He is currently on steroids which will make her  blood glucose erratic her blood glucose is running significantly elevated she is currently on long-acting insulin plus sliding scale we are titrating down her steroids which will help control her blood glucose, will increase her long-acting insulin.  Class 3 severe obesity with serious comorbidity and body mass index (BMI) of 50.0 to 59.9 in adult [facility](HCC) Counseling.  DVT prophylaxis: lovenox Family Communication:none Status is: Inpatient  Remains inpatient appropriate because:Hemodynamically unstable   Dispo: The patient is from: Home              Anticipated d/c is to: Home              Anticipated d/c date is: > 3 days              Patient currently is not medically stable to d/c.        Code Status:     Code Status Orders  (From admission, onward)         Start     Ordered   05/20/20 2129  Full code  Continuous        05/20/20 2129        Code Status History    This patient has a current code status but no historical code status.   Advance Care Planning Activity        IV Access:    Peripheral IV   Procedures and diagnostic studies:   DG CHEST PORT 1 VIEW  Result Date: 05/27/2020 CLINICAL DATA:  COVID-19 positive EXAM: PORTABLE CHEST 1 VIEW COMPARISON:  Radiograph 05/20/2020 FINDINGS: Heterogeneous bilateral airspace opacities superimposed on diminishing lung volumes and increased atelectatic volume loss. No  pneumothorax or visible effusion. Stable cardiomediastinal contours. No acute osseous or soft tissue abnormality. Telemetry leads overlie the chest. IMPRESSION: Persistent heterogeneous bilateral airspace opacities superimposed on some worsening atelectatic change. Electronically Signed   By: Kreg Shropshire M.D.   On: 05/27/2020 16:52     Medical Consultants:    None.  Anti-Infectives:   Baricitinib IV Vanco and cefepime.  Subjective:    Diane Ill she relates her breathing is slightly better than yesterday.  Objective:    Vitals:    05/29/20 0400 05/29/20 0411 05/29/20 0500 05/29/20 0600  BP:   (!) 143/83 (!) 154/74  Pulse: (!) 103  (!) 103 (!) 108  Resp: (!) 40  (!) 42 (!) 43  Temp: 99.6 F (37.6 C)     TempSrc: Axillary     SpO2: 100% 100% 100% 100%  Weight:      Height:       SpO2: 100 % O2 Flow Rate (L/min): 30 L/min FiO2 (%): 100 %   Intake/Output Summary (Last 24 hours) at 05/29/2020 0728 Last data filed at 05/28/2020 2000 Gross per 24 hour  Intake 353 ml  Output 1200 ml  Net -847 ml   Filed Weights   05/20/20 2118  Weight: (!) 139.2 kg    Exam: General exam: In no acute distress. Respiratory system: Good air movement and diffuse crackles bilaterally Cardiovascular system: S1 & S2 heard, tachycardic no JVD. Gastrointestinal system: Abdomen is nondistended, soft and nontender.  Extremities: No pedal edema. Skin: No rashes, lesions or ulcers Psychiatry: Judgement and insight appear normal. Mood & affect appropriate.    Data Reviewed:    Labs: Basic Metabolic Panel: Recent Labs  Lab 05/22/20 0904 05/22/20 0904 05/23/20 0330 05/23/20 0330 05/24/20 0326 05/24/20 0326 05/25/20 0322 05/25/20 0322 05/26/20 0319 05/26/20 0319 05/28/20 0319 05/29/20 0310  NA 137   < > 137   < > 139  --  137  --  136  --  133* 134*  K 4.2   < > 4.5   < > 4.8   < > 4.7   < > 4.9   < > 4.7 5.1  CL 100   < > 101   < > 103  --  100  --  102  --  101 101  CO2 25   < > 24   < > 25  --  28  --  23  --  21* 22  GLUCOSE 221*   < > 254*   < > 270*  --  235*  --  240*  --  201* 258*  BUN 12   < > 13   < > 18  --  22*  --  19  --  22* 24*  CREATININE 0.97   < > 0.78   < > 0.77  --  0.66  --  0.65  --  0.68 0.69  CALCIUM 8.4*   < > 8.5*   < > 8.4*  --  8.5*  --  8.7*  --  8.8* 8.9  MG 2.5*  --  2.9*  --  3.1*  --  2.8*  --   --   --   --   --   PHOS 3.3  --  3.4  --  3.7  --  4.1  --   --   --   --   --    < > = values in this interval not displayed.   GFR Estimated  Creatinine Clearance: 138 mL/min (by C-G  formula based on SCr of 0.69 mg/dL). Liver Function Tests: Recent Labs  Lab 05/24/20 0326 05/25/20 0322 05/26/20 0319 05/28/20 0319 05/29/20 0310  AST 70* 41 38 59* 54*  ALT 99* 79* 77* 155* 172*  ALKPHOS 83 96 89 100 103  BILITOT 0.4 0.8 0.7 0.8 0.5  PROT 7.3 7.5 7.4 8.1 7.5  ALBUMIN 2.8* 2.9* 2.9* 3.1* 3.0*   No results for input(s): LIPASE, AMYLASE in the last 168 hours. No results for input(s): AMMONIA in the last 168 hours. Coagulation profile No results for input(s): INR, PROTIME in the last 168 hours. COVID-19 Labs  Recent Labs    05/27/20 1505 05/28/20 0319 05/29/20 0310  CRP 9.7* 10.0* 5.0*    Lab Results  Component Value Date   SARSCOV2NAA POSITIVE (A) 05/20/2020    CBC: Recent Labs  Lab 05/22/20 0904 05/22/20 0904 05/23/20 0330 05/24/20 0326 05/25/20 0322 05/27/20 1505 05/28/20 0319  WBC 12.4*   < > 13.1* 15.8* 16.5* 24.8* 25.3*  NEUTROABS 9.8*  --  10.9* 13.0* 14.1* 21.6*  --   HGB 12.4   < > 12.4 12.1 12.8 14.1 13.7  HCT 37.6   < > 37.2 37.8 39.3 42.6 41.8  MCV 82.3   < > 81.9 84.4 82.4 82.4 82.9  PLT 368   < > 365 269 319 441* 447*   < > = values in this interval not displayed.   Cardiac Enzymes: No results for input(s): CKTOTAL, CKMB, CKMBINDEX, TROPONINI in the last 168 hours. BNP (last 3 results) No results for input(s): PROBNP in the last 8760 hours. CBG: Recent Labs  Lab 05/28/20 1132 05/28/20 1601 05/28/20 1944 05/28/20 2326 05/29/20 0409  GLUCAP 297* 248* 207* 290* 242*   D-Dimer: No results for input(s): DDIMER in the last 72 hours. Hgb A1c: No results for input(s): HGBA1C in the last 72 hours. Lipid Profile: No results for input(s): CHOL, HDL, LDLCALC, TRIG, CHOLHDL, LDLDIRECT in the last 72 hours. Thyroid function studies: No results for input(s): TSH, T4TOTAL, T3FREE, THYROIDAB in the last 72 hours.  Invalid input(s): FREET3 Anemia work up: No results for input(s): VITAMINB12, FOLATE, FERRITIN, TIBC, IRON,  RETICCTPCT in the last 72 hours. Sepsis Labs: Recent Labs  Lab 05/23/20 0330 05/23/20 0330 05/24/20 0326 05/25/20 0322 05/27/20 1505 05/28/20 0319  PROCALCITON 0.11  --   --   --  0.15  --   WBC 13.1*   < > 15.8* 16.5* 24.8* 25.3*   < > = values in this interval not displayed.   Microbiology Recent Results (from the past 240 hour(s))  Resp Panel by RT-PCR (Flu A&B, Covid) Nasopharyngeal Swab     Status: Abnormal   Collection Time: 05/20/20  2:59 PM   Specimen: Nasopharyngeal Swab; Nasopharyngeal(NP) swabs in vial transport medium  Result Value Ref Range Status   SARS Coronavirus 2 by RT PCR POSITIVE (A) NEGATIVE Final    Comment: RESULT CALLED TO, READ BACK BY AND VERIFIED WITH: LAMB, S RN 1709 05/20/20 JM (NOTE) SARS-CoV-2 target nucleic acids are DETECTED.  The SARS-CoV-2 RNA is generally detectable in upper respiratory specimens during the acute phase of infection. Positive results are indicative of the presence of the identified virus, but do not rule out bacterial infection or co-infection with other pathogens not detected by the test. Clinical correlation with patient history and other diagnostic information is necessary to determine patient infection status. The expected result is Negative.  Fact Sheet for Patients: BloggerCourse.com  Fact Sheet for Healthcare Providers: SeriousBroker.it  This test is not yet approved or cleared by the [place] FDA and  has been authorized for detection and/or diagnosis of SARS-CoV-2 by FDA under an Emergency Use Authorization (EUA).  This EUA will remain in effect (meaning this test can be used)  for the duration of  the COVID-19 declaration under Section 564(b)(1) of the Act, 21 U.S.C. section 360bbb-3(b)(1), unless the authorization is terminated or revoked sooner.     Influenza A by PCR NEGATIVE NEGATIVE Final   Influenza B by PCR NEGATIVE NEGATIVE Final    Comment:  (NOTE) The Xpert Xpress SARS-CoV-2/FLU/RSV plus assay is intended as an aid in the diagnosis of influenza from Nasopharyngeal swab specimens and should not be used as a sole basis for treatment. Nasal washings and aspirates are unacceptable for Xpert Xpress SARS-CoV-2/FLU/RSV testing.  Fact Sheet for Patients: BloggerCourse.com  Fact Sheet for Healthcare Providers: SeriousBroker.it  This test is not yet approved or cleared by the [place] FDA and has been authorized for detection and/or diagnosis of SARS-CoV-2 by FDA under an Emergency Use Authorization (EUA). This EUA will remain in effect (meaning this test can be used) for the duration of the COVID-19 declaration under Section 564(b)(1) of the Act, 21 U.S.C. section 360bbb-3(b)(1), unless the authorization is terminated or revoked.  Performed at [facility], [address], [place], [place] [address]   Blood Culture (routine x 2)     Status: None   Collection Time: 05/20/20  2:59 PM   Specimen: BLOOD  Result Value Ref Range Status   Specimen Description   Final    BLOOD SITE NOT SPECIFIED Performed at [facility], [address], [place], [place] [address]    Special Requests   Final    BOTTLES DRAWN AEROBIC AND ANAEROBIC Blood Culture adequate volume Performed at [facility], [address], [place], [place] [address]    Culture   Final    NO GROWTH 5 DAYS Performed at [facility], [address] [place], [place] [address]    Report Status 05/25/2020 FINAL  Final  Blood Culture (routine x 2)     Status: None   Collection Time: 05/20/20  4:24 PM   Specimen: BLOOD RIGHT HAND  Result Value Ref Range Status   Specimen Description   Final    BLOOD RIGHT HAND Performed at [facility], [address], , [place] [address]    Special Requests   Final    BOTTLES DRAWN AEROBIC AND ANAEROBIC  Blood Culture adequate volume Performed at [facility], [address] [place], [place] [address]    Culture   Final    NO GROWTH 5 DAYS Performed at [facility], [address], [place], [place] [address]    Report Status 05/25/2020 FINAL  Final  MRSA PCR Screening     Status: None   Collection Time: 05/22/20  3:32 AM   Specimen: Nasal Mucosa; Nasopharyngeal  Result Value Ref Range Status   MRSA by PCR NEGATIVE NEGATIVE Final    Comment:        The GeneXpert MRSA Assay (FDA approved for NASAL specimens only), is one component of a comprehensive MRSA colonization surveillance program. It is not intended to diagnose MRSA infection nor to guide or monitor treatment for MRSA infections. Performed at [facility], [address] [place], [place] [address]   Culture, blood (  routine x 2)     Status: None (Preliminary result)   Collection Time: 05/27/20  3:05 PM   Specimen: BLOOD  Result Value Ref Range Status   Specimen Description   Final    BLOOD LEFT ANTECUBITAL Performed at [facility], [address], [place], [place] [address]    Special Requests   Final    BOTTLES DRAWN AEROBIC ONLY Blood Culture adequate volume Performed at [facility], [address] [place], [place] [address]    Culture   Final    NO GROWTH < 24 HOURS Performed at [facility], [address] [place], [place] [address]    Report Status PENDING  Incomplete  Culture, blood (routine x 2)     Status: None (Preliminary result)   Collection Time: 05/27/20  3:05 PM   Specimen: BLOOD LEFT HAND  Result Value Ref Range Status   Specimen Description   Final    BLOOD LEFT HAND Performed at [facility], [address], [place], [place] [address]    Special Requests   Final    BOTTLES DRAWN AEROBIC ONLY Blood Culture adequate volume Performed at [facility], [address] [place], [place] [address]    Culture   Final    NO GROWTH < 24 HOURS Performed at [facility], [address], [place], [place] [address]    Report Status PENDING  Incomplete  MRSA PCR Screening     Status: None   Collection Time: 05/27/20  6:00 PM   Specimen: Nasal Mucosa; Nasopharyngeal  Result Value Ref Range Status   MRSA by PCR NEGATIVE NEGATIVE Final    Comment:        The GeneXpert MRSA Assay (FDA approved for NASAL specimens only), is one component of a comprehensive MRSA colonization surveillance program. It is not intended to diagnose MRSA infection nor to guide or monitor treatment for MRSA infections. Performed at [facility], [address] [place], [place] [address]      Medications:   . (feeding supplement) PROSource Plus  30 mL Oral TID BM  . albuterol  2 puff Inhalation Q4H  . baricitinib  4 mg Oral Daily  . Chlorhexidine Gluconate Cloth  6 each Topical Q0600  . enoxaparin (LOVENOX) injection  140 mg Subcutaneous Q12H  . feeding supplement (NEPRO CARB STEADY)  237 mL Oral TID BM  . hydrALAZINE  25 mg Oral Q8H  . insulin aspart  0-20 Units Subcutaneous TID WC  . insulin aspart  0-5 Units Subcutaneous QHS  . insulin aspart  6 Units Subcutaneous TID WC  . insulin glargine  35 Units Subcutaneous Daily  . linagliptin  5 mg Oral Daily  . mouth rinse  15 mL Mouth Rinse BID  . melatonin  5 mg Oral QHS  . methylPREDNISolone (SOLU-MEDROL) injection  80 mg Intravenous Q12H  . metoprolol tartrate  50 mg Oral BID  . multivitamin with minerals  1 tablet Oral Daily  . sodium chloride flush  3 mL Intravenous Q12H  . sodium chloride flush  3 mL Intravenous Q12H   Continuous Infusions: . sodium chloride Stopped (05/26/20 1130)  . ceFEPime (MAXIPIME) IV 2 g (05/29/20 0622)      LOS: 9 days   Marinda Elk  [facility]  05/29/2020, 7:28 AM

## 2020-05-29 NOTE — Progress Notes (Signed)
Tried patient on BIPAP but patient was unable to wear it because she felt like she could not breath. Her RR was in the 50's so this RT took her off and put her back on partial rebreather and HHFNC. Patient now RR is in the 30's.

## 2020-05-29 NOTE — Progress Notes (Signed)
ANTICOAGULATION CONSULT NOTE - Initial Consult  Pharmacy Consult for heparin Indication: VTE  No Known Allergies  Patient Measurements: Height: 5\' 5"  (165.1 cm) Weight: (!) 139.2 kg (306 lb 14.1 oz) IBW/kg (Calculated) : 57 Heparin dosing weight: 92 kg  Vital Signs: Temp: 99.6 F (37.6 C) (12/05 0400) Temp Source: Axillary (12/05 0400) BP: 142/95 (12/05 0905) Pulse Rate: 116 (12/05 0905)  Labs: Recent Labs    05/27/20 1505 05/28/20 0319 05/29/20 0310  HGB 14.1 13.7  --   HCT 42.6 41.8  --   PLT 441* 447*  --   CREATININE  --  0.68 0.69    Estimated Creatinine Clearance: 138 mL/min (by C-G formula based on SCr of 0.69 mg/dL).   Medical History: Past Medical History:  Diagnosis Date  . Allergy   . Back pain   . Depression   . Diabetes mellitus without complication (HCC) 07/20/13   no meds at this time  . Dyspnea   . GERD (gastroesophageal reflux disease)   . PCOS (polycystic ovarian syndrome)   . Sleep apnea   . Vitamin D deficiency     Assessment: Pt is a 34 year old female admitted with respiratory failure due to COVID-19 PNA. Currently on therapeutic LMWH for acute bilateral DVT. Pharmacy consulted on 12/5 to transition anticoagulation to heparin drip.   -11/30: Venous doppler: + Bilateral DVT -12/5: CTA: Ordered  Today, 05/29/20  CBC (12/4): Hgb stable, WNL. Plt elevated  SCr WNL, stable  Last dose of enoxaparin given @ 0625 this morning  Goal of Therapy:  HL = 0.3 - 0.7   Plan:   Discontinue enoxaparin  Start heparin drip @ 1430 this afternoon  No initial bolus since anticoagulated with LMWH  Initiate heparin infusion at 1600 units/hr  Check 6 hour HL, CBC  CBC, HL daily while on heparin infusion  Monitor for signs of bleeding  01-28-1990, PharmD 05/29/20 12:05 PM

## 2020-05-29 NOTE — Evaluation (Signed)
Occupational Therapy Evaluation Patient Details Name: Diane Padilla MRN: [id number] DOB: Jan 27, 1986 Today's Date: 05/29/2020    History of Present Illness Diane Padilla is an 34 y.o. female past medical history of diabetes mellitus type 2 obstructive sleep apnea obesity who presents with shortness of breath was found to be COVID-19 positive.    Clinical Impression   Diane Padilla is a 34 year old woman admitted to hospital with COVID and presents on 15 L HFNC and 15 L PRB with HR at 120, sat at 91% and RR in the 40s. Patient demonstrates decreased activity tolerance, impaired cardiopulmonary status and generalized weakness limiting her ability to perform independent ambulation and daily tasks. Patient supervision for bed mobility and min guard for standing, min guard and set up for ADLs.  Patient predominantly limited today by respiratory rate ranging from the 30-50s. Prolonged education and instruction on breathing techniques - diaphragmatic breathing and pursed lipped breathing to reduce breathing rate. Patient verbalized understanding and demonstrated instruction. Patient coughing with attempts at deeper breathing. Patient's sat maintained above 85% and predominantly in the 90s during evaluation and treatment. Patient educated on exercises to perform in bed and the need for mobility for recovery. Patient verbalized understanding. Patient will benefit from skilled OT services to improve activity tolerance and cardiopulmonary status  to reduce oxygenation needs in order for patient to return home at discharge.  .      Follow Up Recommendations  Home health OT    Equipment Recommendations  Tub/shower seat    Recommendations for Other Services       Precautions / Restrictions Precautions Precaution Comments: monitor o2 sats and RR, 15 L and 15 PRB Restrictions Weight Bearing Restrictions: No      Mobility Bed Mobility Overal bed mobility: Needs Assistance Bed Mobility:  Supine to Sit;Sit to Supine     Supine to sit: Supervision;HOB elevated Sit to supine: Supervision        Transfers Overall transfer level: Needs assistance Equipment used: None Transfers: Sit to/from [employer] Sit to Stand: Min guard         General transfer comment: mobility limited to standing and taking steps to head of bed. min guard only.    Balance Overall balance assessment: No apparent balance deficits (not formally assessed)                                         ADL either performed or assessed with clinical judgement   ADL Overall ADL's : Needs assistance/impaired Eating/Feeding: Independent   Grooming: Set up   Upper Body Bathing: Set up   Lower Body Bathing: Set up;Min guard;Sit to/from stand   Upper Body Dressing : Set up   Lower Body Dressing: Set up;Min guard;Sit to/from stand   Toilet Transfer: Min guard;BSC;Stand-pivot   Toileting- Architect and Hygiene: Min guard;Sit to/from stand       Functional mobility during ADLs: Min guard       Vision Baseline Vision/History: No visual deficits       Perception     Praxis      Pertinent Vitals/Pain Pain Assessment: No/denies pain     Hand Dominance     Extremity/Trunk Assessment Upper Extremity Assessment Upper Extremity Assessment: Overall WFL for tasks assessed   Lower Extremity Assessment Lower Extremity Assessment: Defer to PT evaluation   Cervical / Trunk Assessment Cervical /  Trunk Assessment: Normal   Communication     Cognition Arousal/Alertness: Awake/alert Behavior During Therapy: WFL for tasks assessed/performed Overall Cognitive Status: Within Functional Limits for tasks assessed                                     General Comments       Exercises     Shoulder Instructions      Home Living Family/patient expects to be discharged to:: Private residence Living Arrangements: Spouse/significant  other                                      Prior Functioning/Environment                   OT Problem List: Decreased activity tolerance;Cardiopulmonary status limiting activity;Decreased knowledge of use of DME or AE      OT Treatment/Interventions: Self-care/ADL training;Therapeutic exercise;DME and/or AE instruction;Energy conservation;Patient/family education;Balance training;Therapeutic activities    OT Goals(Current goals can be found in the care plan section) Acute Rehab OT Goals Patient Stated Goal: Did not state OT Goal Formulation: With patient Time For Goal Achievement: 06/12/20 Potential to Achieve Goals: Good  OT Frequency: Min 2X/week   Barriers to D/C:            Co-evaluation              AM-PAC OT "6 Clicks" Daily Activity     Outcome Measure Help from another person eating meals?: None Help from another person taking care of personal grooming?: A Little Help from another person toileting, which includes using toliet, bedpan, or urinal?: A Little Help from another person bathing (including washing, rinsing, drying)?: A Little Help from another person to put on and taking off regular upper body clothing?: A Little Help from another person to put on and taking off regular lower body clothing?: A Little 6 Click Score: 19   End of Session Nurse Communication:  (okay to see per RN)  Activity Tolerance: Patient limited by fatigue Patient left: in bed;with call bell/phone within reach  OT Visit Diagnosis: Muscle weakness (generalized) (M62.81)                Time: 6606-3016 OT Time Calculation (min): 34 min Charges:  OT General Charges $OT Visit: 1 Visit OT Evaluation $OT Eval Moderate Complexity: 1 Mod OT Treatments $Therapeutic Activity: 8-22 mins  Diane Padilla, OTR/L Acute Care Rehab Services  Office [phone] Pager: [phone]   Diane Padilla 05/29/2020, 3:43 PM

## 2020-05-29 NOTE — Progress Notes (Signed)
NAME:  Diane Padilla, MRN:  [id number], DOB:  [birth year], LOS: 9 ADMISSION DATE:  05/20/2020, CONSULTATION DATE:  11/29 REFERRING MD:  Lafe Garin, CHIEF COMPLAINT:  Dyspnea   Brief History   34 y/o female admitted with severe acute respiratory failure with hypoxemia due to COVID 19 pneumonia on 11/25, moved to the ICU on 11/29. Has been on heated high flow oxygen since then.   Past Medical History  GERD PCOS OSA DM2 Depression  Significant Hospital Events   11/26 admit. Oxygen and steroids started 11/27 Baricitinib started 11/28 Remdesivir started  11/29 her oxygen requirements have increased to 30 L high flow at 100% in addition to nonrebreather. PCCM consulted for refractory hypoxemia. 11/30 bilateral LE DVTs therapeutic AC started 12/1-12/3: completed Remdesivir 12/2. 12/3 HR elevated. Slightly hypertensive. But improved spont. Attempting BIPAP at HS and PRN (she was on CPAP at home)  Consults:  PCCM  Procedures:    Significant Diagnostic Tests:    Micro Data:  11/26 sars cov 2 > positive, flu negative 11/26 blood > neg 12/3 blood > NGTD  Antimicrobials:  11/27 baricitinib >  11/27 remdesivir >  12/3 vancomycin >  12/3 cefepime >   Interim history/subjective:   Unable to wear BIPAP overnight Says she feels better despite tachypnea Oxygenation better   Objective   Blood pressure (!) 154/74, pulse (!) 108, temperature 99.6 F (37.6 C), temperature source Axillary, resp. rate (!) 43, height 5\' 5"  (1.651 m), weight (!) 139.2 kg, last menstrual period 05/07/2020, SpO2 100 %.    FiO2 (%):  [100 %] 100 %   Intake/Output Summary (Last 24 hours) at 05/29/2020 14/10/2019 Last data filed at 05/28/2020 2000 Gross per 24 hour  Intake 353 ml  Output 1200 ml  Net -847 ml   Filed Weights   05/20/20 2118  Weight: (!) 139.2 kg    Examination: General:  Resting comfortably in bed but tachypnea, no accessory muscle use or paradoxical breathing HENT: NCAT OP clear PULM:  CTA B, normal effort CV: RRR, no mgr GI: BS+, soft, nontender MSK: normal bulk and tone Neuro: awake, alert, no distress, MAEW   Resolved Hospital Problem list     Assessment & Plan:  ARDS due to COVID 19 pneumonia> improved oxygenation but tachypnea somewhat worse Possible HCAP Agree with antibiotics Continue baricitinib for now Wean down FiO2 on heated high flow Agree with CT angiogram today as per primary team Continue to monitor closely in ICU Tolerate periods of hypoxemia, goal at rest is greater than 85% SaO2, with movement ideally above 75% Decision for intubation should be based on a change in mental status or physical evidence of ventilatory failure such as nasal flaring, accessory muscle use, paradoxical breathing Out of bed to chair as able Incentive spirometry is important, use every hour Prone positioning while in bed  OSA on CPAP Continue heated high flow at night for now Attempt CPAP again as respiratory status improves  Best practice (evaluated daily)   Per Primary  Labs   CBC: Recent Labs  Lab 05/22/20 0904 05/22/20 0904 05/23/20 0330 05/24/20 0326 05/25/20 0322 05/27/20 1505 05/28/20 0319  WBC 12.4*   < > 13.1* 15.8* 16.5* 24.8* 25.3*  NEUTROABS 9.8*  --  10.9* 13.0* 14.1* 21.6*  --   HGB 12.4   < > 12.4 12.1 12.8 14.1 13.7  HCT 37.6   < > 37.2 37.8 39.3 42.6 41.8  MCV 82.3   < > 81.9 84.4 82.4 82.4 82.9  PLT  368   < > 365 269 319 441* 447*   < > = values in this interval not displayed.    Basic Metabolic Panel: Recent Labs  Lab 05/22/20 0904 05/22/20 0904 05/23/20 0330 05/23/20 0330 05/24/20 0326 05/25/20 0322 05/26/20 0319 05/28/20 0319 05/29/20 0310  NA 137   < > 137   < > 139 137 136 133* 134*  K 4.2   < > 4.5   < > 4.8 4.7 4.9 4.7 5.1  CL 100   < > 101   < > 103 100 102 101 101  CO2 25   < > 24   < > 25 28 23  21* 22  GLUCOSE 221*   < > 254*   < > 270* 235* 240* 201* 258*  BUN 12   < > 13   < > 18 22* 19 22* 24*  CREATININE  0.97   < > 0.78   < > 0.77 0.66 0.65 0.68 0.69  CALCIUM 8.4*   < > 8.5*   < > 8.4* 8.5* 8.7* 8.8* 8.9  MG 2.5*  --  2.9*  --  3.1* 2.8*  --   --   --   PHOS 3.3  --  3.4  --  3.7 4.1  --   --   --    < > = values in this interval not displayed.   GFR: Estimated Creatinine Clearance: 138 mL/min (by C-G formula based on SCr of 0.69 mg/dL). Recent Labs  Lab 05/23/20 0330 05/23/20 0330 05/24/20 0326 05/25/20 0322 05/27/20 1505 05/28/20 0319  PROCALCITON 0.11  --   --   --  0.15  --   WBC 13.1*   < > 15.8* 16.5* 24.8* 25.3*   < > = values in this interval not displayed.    Liver Function Tests: Recent Labs  Lab 05/24/20 0326 05/25/20 0322 05/26/20 0319 05/28/20 0319 05/29/20 0310  AST 70* 41 38 59* 54*  ALT 99* 79* 77* 155* 172*  ALKPHOS 83 96 89 100 103  BILITOT 0.4 0.8 0.7 0.8 0.5  PROT 7.3 7.5 7.4 8.1 7.5  ALBUMIN 2.8* 2.9* 2.9* 3.1* 3.0*   No results for input(s): LIPASE, AMYLASE in the last 168 hours. No results for input(s): AMMONIA in the last 168 hours.  ABG No results found for: PHART, PCO2ART, PO2ART, HCO3, TCO2, ACIDBASEDEF, O2SAT   Coagulation Profile: No results for input(s): INR, PROTIME in the last 168 hours.  Cardiac Enzymes: No results for input(s): CKTOTAL, CKMB, CKMBINDEX, TROPONINI in the last 168 hours.  HbA1C: Hgb A1c MFr Bld  Date/Time Value Ref Range Status  05/21/2020 04:38 AM 6.6 (H) 4.8 - 5.6 % Final    Comment:    (NOTE) Pre diabetes:          5.7%-6.4%  Diabetes:              >6.4%  Glycemic control for   <7.0% adults with diabetes   01/19/2019 12:19 PM 6.1 (H) 4.8 - 5.6 % Final    Comment:             Prediabetes: 5.7 - 6.4          Diabetes: >6.4          Glycemic control for adults with diabetes: <7.0     CBG: Recent Labs  Lab 05/28/20 1132 05/28/20 1601 05/28/20 1944 05/28/20 2326 05/29/20 0409  GLUCAP 297* 248* 207* 290* 242*    Critical care time: 31  minutes       Heber [place], MD [place]  PCCM Pager: [phone] Cell: [phone] If no response, call [phone]

## 2020-05-30 DIAGNOSIS — J9601 Acute respiratory failure with hypoxia: Secondary | ICD-10-CM | POA: Diagnosis not present

## 2020-05-30 DIAGNOSIS — I824Z3 Acute embolism and thrombosis of unspecified deep veins of distal lower extremity, bilateral: Secondary | ICD-10-CM | POA: Diagnosis not present

## 2020-05-30 DIAGNOSIS — U071 COVID-19: Secondary | ICD-10-CM | POA: Diagnosis not present

## 2020-05-30 DIAGNOSIS — E1165 Type 2 diabetes mellitus with hyperglycemia: Secondary | ICD-10-CM | POA: Diagnosis not present

## 2020-05-30 LAB — CBC WITH DIFFERENTIAL/PLATELET
Abs Immature Granulocytes: 0.32 10*3/uL — ABNORMAL HIGH (ref 0.00–0.07)
Basophils Absolute: 0 10*3/uL (ref 0.0–0.1)
Basophils Relative: 0 %
Eosinophils Absolute: 0.1 10*3/uL (ref 0.0–0.5)
Eosinophils Relative: 0 %
HCT: 38.9 % (ref 36.0–46.0)
Hemoglobin: 12.7 g/dL (ref 12.0–15.0)
Immature Granulocytes: 1 %
Lymphocytes Relative: 8 %
Lymphs Abs: 1.8 10*3/uL (ref 0.7–4.0)
MCH: 27.1 pg (ref 26.0–34.0)
MCHC: 32.6 g/dL (ref 30.0–36.0)
MCV: 83.1 fL (ref 80.0–100.0)
Monocytes Absolute: 1.2 10*3/uL — ABNORMAL HIGH (ref 0.1–1.0)
Monocytes Relative: 5 %
Neutro Abs: 20.7 10*3/uL — ABNORMAL HIGH (ref 1.7–7.7)
Neutrophils Relative %: 86 %
Platelets: 530 10*3/uL — ABNORMAL HIGH (ref 150–400)
RBC: 4.68 MIL/uL (ref 3.87–5.11)
RDW: 14.3 % (ref 11.5–15.5)
WBC: 24.2 10*3/uL — ABNORMAL HIGH (ref 4.0–10.5)
nRBC: 0 % (ref 0.0–0.2)

## 2020-05-30 LAB — GLUCOSE, CAPILLARY
Glucose-Capillary: 194 mg/dL — ABNORMAL HIGH (ref 70–99)
Glucose-Capillary: 196 mg/dL — ABNORMAL HIGH (ref 70–99)
Glucose-Capillary: 230 mg/dL — ABNORMAL HIGH (ref 70–99)
Glucose-Capillary: 243 mg/dL — ABNORMAL HIGH (ref 70–99)
Glucose-Capillary: 256 mg/dL — ABNORMAL HIGH (ref 70–99)
Glucose-Capillary: 311 mg/dL — ABNORMAL HIGH (ref 70–99)

## 2020-05-30 LAB — BASIC METABOLIC PANEL
Anion gap: 10 (ref 5–15)
BUN: 22 mg/dL — ABNORMAL HIGH (ref 6–20)
CO2: 23 mmol/L (ref 22–32)
Calcium: 9.1 mg/dL (ref 8.9–10.3)
Chloride: 101 mmol/L (ref 98–111)
Creatinine, Ser: 0.65 mg/dL (ref 0.44–1.00)
GFR, Estimated: >60 mL/min (ref >60)
Glucose, Bld: 230 mg/dL — ABNORMAL HIGH (ref 70–99)
Potassium: 4.6 mmol/L (ref 3.5–5.1)
Sodium: 134 mmol/L — ABNORMAL LOW (ref 135–145)

## 2020-05-30 LAB — HEPARIN LEVEL (UNFRACTIONATED)
Heparin Unfractionated: 1.12 IU/mL — ABNORMAL HIGH (ref 0.30–0.70)
Heparin Unfractionated: 1.08 IU/mL — ABNORMAL HIGH (ref 0.30–0.70)
Heparin Unfractionated: 0.37 IU/mL (ref 0.30–0.70)

## 2020-05-30 LAB — C-REACTIVE PROTEIN: CRP: 2.9 mg/dL — ABNORMAL HIGH (ref <1.0)

## 2020-05-30 LAB — D-DIMER, QUANTITATIVE: D-Dimer, Quant: 5.75 ug/mL-FEU — ABNORMAL HIGH (ref 0.00–0.50)

## 2020-05-30 MED ORDER — ALBUTEROL SULFATE HFA 108 (90 BASE) MCG/ACT IN AERS
2.0000 | INHALATION_SPRAY | Freq: Four times a day (QID) | RESPIRATORY_TRACT | Status: DC
  Administered 2020-05-31 – 2020-06-11 (×42): 2 via RESPIRATORY_TRACT
  Filled 2020-05-30 (×2): qty 6.7

## 2020-05-30 MED ORDER — SODIUM CHLORIDE 0.9% FLUSH: 10.0000 mL | INTRAVENOUS | Status: DC | PRN

## 2020-05-30 MED ORDER — SODIUM CHLORIDE 0.9% FLUSH
10.0000 mL | Freq: Two times a day (BID) | INTRAVENOUS | Status: DC
  Administered 2020-05-31 – 2020-06-06 (×12): 10 mL
  Administered 2020-06-07: 20 mL
  Administered 2020-06-07 – 2020-06-10 (×7): 10 mL

## 2020-05-30 MED ORDER — METHYLPREDNISOLONE SODIUM SUCC 40 MG IJ SOLR
20.0000 mg | Freq: Two times a day (BID) | INTRAMUSCULAR | Status: DC
  Administered 2020-05-30 – 2020-05-31 (×2): 20 mg via INTRAVENOUS
  Filled 2020-05-30 (×2): qty 1

## 2020-05-30 MED ORDER — HEPARIN (PORCINE) 25000 UT/250ML-% IV SOLN
1400.0000 [IU]/h | INTRAVENOUS | Status: DC
  Administered 2020-05-30: 1400 [IU]/h via INTRAVENOUS

## 2020-05-30 MED ORDER — HEPARIN (PORCINE) 25000 UT/250ML-% IV SOLN
1000.0000 [IU]/h | INTRAVENOUS | Status: DC
  Administered 2020-05-30 (×2): 1000 [IU]/h via INTRAVENOUS
  Filled 2020-05-30: qty 250

## 2020-05-30 MED ORDER — INSULIN GLARGINE 100 UNIT/ML ~~LOC~~ SOLN
25.0000 [IU] | Freq: Two times a day (BID) | SUBCUTANEOUS | Status: DC
  Administered 2020-05-30 – 2020-06-01 (×5): 25 [IU] via SUBCUTANEOUS
  Filled 2020-05-30 (×5): qty 0.25

## 2020-05-30 NOTE — Progress Notes (Signed)
NAME:  Diane Padilla, MRN:  [id number], DOB:  Feb 14, 1986, LOS: 10 ADMISSION DATE:  05/20/2020, CONSULTATION DATE:  11/29 REFERRING MD:  Lafe Garin, CHIEF COMPLAINT:  Dyspnea   Brief History   34 y/o female admitted with severe acute respiratory failure with hypoxemia due to COVID 19 pneumonia on 11/25, moved to the ICU on 11/29. Has been on heated high flow oxygen since then.   Past Medical History  GERD PCOS OSA DM2 Depression  Significant Hospital Events   11/26 admit. Oxygen and steroids started 11/27 Baricitinib started 11/28 Remdesivir started  11/29 her oxygen requirements have increased to 30 L high flow at 100% in addition to nonrebreather. PCCM consulted for refractory hypoxemia. 11/30 bilateral LE DVTs therapeutic AC started 12/1-12/3: completed Remdesivir 12/2. 12/3 HR elevated. Slightly hypertensive. But improved spont. Attempting BIPAP at HS and PRN (she was on CPAP at home)  Consults:  PCCM  Procedures:    Significant Diagnostic Tests:    Micro Data:  11/26 sars cov 2 > positive, flu negative 11/26 blood > neg 12/3 blood > NGTD  Antimicrobials:  11/27 baricitinib >  11/27 remdesivir >  12/3 vancomycin >  12/3 cefepime >   Interim history/subjective:   Overall feeling okay.  Unable to wear BiPAP  Objective   Blood pressure (!) 156/73, pulse (!) 103, temperature 99.9 F (37.7 C), temperature source Oral, resp. rate (!) 32, height 5\' 5"  (1.651 m), weight (!) 139.2 kg, last menstrual period 05/07/2020, SpO2 (!) 84 %.    FiO2 (%):  [80 %-90 %] 80 %   Intake/Output Summary (Last 24 hours) at 05/30/2020 1014 Last data filed at 05/30/2020 0604 Gross per 24 hour  Intake 475.91 ml  Output 1075 ml  Net -599.09 ml   Filed Weights   05/20/20 2118  Weight: (!) 139.2 kg    Examination: General: Young woman sitting up in a chair in no acute distress HENT: /AT, eyes anicteric PULM: Tachypnea, no accessory muscle use.  Faint rales bilaterally. CV:  Regular rate and rhythm, no murmurs GI: Obese, soft, nontender, nondistended MSK: Peripheral edema, no clubbing or cyanosis. Neuro: Awake and alert, answering questions appropriately, moving all extremities spontaneously.   Resolved Hospital Problem list     Assessment & Plan:  ARDS due to COVID 19 pneumonia> improved oxygenation but tachypnea somewhat worse Possible HCAP -Agree with antibiotics; complete 7 days -Continue baricitinib; may have to discontinue if respiratory cultures are positive -Wean down FiO2 on heated high flow -Continue to monitor closely in ICU -Tolerate periods of hypoxemia, goal at rest is greater than 85% SaO2, with movement ideally above 75%.  Decision for intubation should be based on work of breathing and mental status. -Out of bed mobility as tolerated.  Likely will require increased oxygen for this. -Incentive spirometry -Prone or side positioning whenever in bed.  OSA on CPAP -Continue heated high flow at night for now -Attempt CPAP again as respiratory status improves; recommend long-term CPAP  Best practice (evaluated daily)   Per Primary  Labs   CBC: Recent Labs  Lab 05/24/20 0326 05/24/20 0326 05/25/20 0322 05/27/20 1505 05/28/20 0319 05/29/20 2239 05/30/20 0237  WBC 15.8*   < > 16.5* 24.8* 25.3* 23.5* 24.2*  NEUTROABS 13.0*  --  14.1* 21.6*  --   --  20.7*  HGB 12.1   < > 12.8 14.1 13.7 12.5 12.7  HCT 37.8   < > 39.3 42.6 41.8 39.1 38.9  MCV 84.4   < > 82.4  82.4 82.9 83.7 83.1  PLT 269   < > 319 441* 447* 497* 530*   < > = values in this interval not displayed.    Basic Metabolic Panel: Recent Labs  Lab 05/24/20 0326 05/24/20 0326 05/25/20 0322 05/26/20 0319 05/28/20 0319 05/29/20 0310 05/30/20 0237  NA 139   < > 137 136 133* 134* 134*  K 4.8   < > 4.7 4.9 4.7 5.1 4.6  CL 103   < > 100 102 101 101 101  CO2 25   < > 28 23 21* 22 23  GLUCOSE 270*   < > 235* 240* 201* 258* 230*  BUN 18   < > 22* 19 22* 24* 22*   CREATININE 0.77   < > 0.66 0.65 0.68 0.69 0.65  CALCIUM 8.4*   < > 8.5* 8.7* 8.8* 8.9 9.1  MG 3.1*  --  2.8*  --   --   --   --   PHOS 3.7  --  4.1  --   --   --   --    < > = values in this interval not displayed.   GFR: Estimated Creatinine Clearance: 138 mL/min (by C-G formula based on SCr of 0.65 mg/dL). Recent Labs  Lab 05/27/20 1505 05/28/20 0319 05/29/20 2239 05/30/20 0237  PROCALCITON 0.15  --   --   --   WBC 24.8* 25.3* 23.5* 24.2*    Liver Function Tests: Recent Labs  Lab 05/24/20 0326 05/25/20 0322 05/26/20 0319 05/28/20 0319 05/29/20 0310  AST 70* 41 38 59* 54*  ALT 99* 79* 77* 155* 172*  ALKPHOS 83 96 89 100 103  BILITOT 0.4 0.8 0.7 0.8 0.5  PROT 7.3 7.5 7.4 8.1 7.5  ALBUMIN 2.8* 2.9* 2.9* 3.1* 3.0*   No results for input(s): LIPASE, AMYLASE in the last 168 hours. No results for input(s): AMMONIA in the last 168 hours.  ABG No results found for: PHART, PCO2ART, PO2ART, HCO3, TCO2, ACIDBASEDEF, O2SAT   Coagulation Profile: No results for input(s): INR, PROTIME in the last 168 hours.  Cardiac Enzymes: No results for input(s): CKTOTAL, CKMB, CKMBINDEX, TROPONINI in the last 168 hours.  HbA1C: Hgb A1c MFr Bld  Date/Time Value Ref Range Status  05/21/2020 04:38 AM 6.6 (H) 4.8 - 5.6 % Final    Comment:    (NOTE) Pre diabetes:          5.7%-6.4%  Diabetes:              >6.4%  Glycemic control for   <7.0% adults with diabetes   01/19/2019 12:19 PM 6.1 (H) 4.8 - 5.6 % Final    Comment:             Prediabetes: 5.7 - 6.4          Diabetes: >6.4          Glycemic control for adults with diabetes: <7.0     CBG: Recent Labs  Lab 05/29/20 1653 05/29/20 2021 05/29/20 2338 05/30/20 0354 05/30/20 0753  GLUCAP 292* 263* 257* 196* 194*     This patient is critically ill with multiple organ system failure which requires frequent high complexity decision making, assessment, support, evaluation, and titration of therapies. This was completed  through the application of advanced monitoring technologies and extensive interpretation of multiple databases. During this encounter critical care time was devoted to patient care services described in this note for 33 minutes.  Steffanie Dunn, DO 05/30/20 8:40 PM   Pulmonary & Critical Care

## 2020-05-30 NOTE — Progress Notes (Signed)
Pharmacy Antibiotic Note  JOCI DRESS is a 34 y.o. female admitted on 05/20/2020 with COVID pneumonia. Antibiotics started on D7 of admission with new fevers and worsening opacities/atalectasis on CXR. Pharmacy has been consulted for cefepime dosing for bacterial PNA r/o.  Plan:  Cefepime 2 g IV q8 hr  Follow up renal function, culture results, and clinical course.   Height: 5\' 5"  (165.1 cm) Weight: (!) 139.2 kg (306 lb 14.1 oz) IBW/kg (Calculated) : 57  Temp (24hrs), Avg:99.1 F (37.3 C), Min:97.9 F (36.6 C), Max:99.9 F (37.7 C)  Recent Labs  Lab 05/25/20 0322 05/26/20 0319 05/27/20 1505 05/28/20 0319 05/29/20 0310 05/29/20 2239 05/30/20 0237  WBC 16.5*  --  24.8* 25.3*  --  23.5* 24.2*  CREATININE 0.66 0.65  --  0.68 0.69  --  0.65    Estimated Creatinine Clearance: 138 mL/min (by C-G formula based on SCr of 0.65 mg/dL).    No Known Allergies  Antimicrobials this admission: 11/27 barcitinib >> ( 12/10 )  11/28 remdesivir>> 12/2  12/3 vanc >> 12/4  12/3 cefepime  >>  Dose adjustments this admission: n/a  Microbiology results: 12/3 BCx: NGTD 12/3 MRSA PCR: negative  Thank you for allowing pharmacy to be a part of this patient's care.  14/3 PharmD, BCPS Clinical Pharmacist [facility] main pharmacy [phone] 05/30/2020 8:45 AM

## 2020-05-30 NOTE — Progress Notes (Signed)
Patient does not want to wear BIPAP for tonight. Encouraged her to call if she changes her mind

## 2020-05-30 NOTE — Progress Notes (Signed)
[facility] PROGRESS NOTE    Progress Note  Diane Padilla  WUJ:[id number] DOB: 1986-03-14 DOA: 05/20/2020 PCP: [facility]     Brief Narrative:   Diane Padilla is an 34 y.o. female past medical history of diabetes mellitus type 2 obstructive sleep apnea obesity who presents with shortness of breath was found to be COVID-19 positive requiring 16 L was admitted to the ICU on steroids initially declined other therapies, after discussion and her declining clinical status she agreed to IV remdesivir and baricitinib, lower extremity Doppler was positive for acute DVTs she was started on Lovenox 05/24/2020 she has remained severely hypoxic.  Antibiotics were started on December 3.  Assessment/Plan:   Acute respiratory failure with hypoxia due to COVID-19 pneumonia/and possibly ARDS: Ms. Diane Padilla today patient feels a little bit better than yesterday, her oxygen requirements are improving. Her inflammatory markers are improving yesterday she was switched from Lovenox to IV heparin. Continue IV steroids and baricitinib. We are titrating down her steroids. Due to worsening inflammatory markers and fever she was started empirically on IV antibiotics for possible healthcare associated pneumonia on 05/27/2020. She has defervesced, CT angio of the chest showed no PE but it showed groundglass opacities bilaterally. We will continue empiric treatment of antibiotics for 7 days. We will need to be in airborne precaution for at least 21 days.  Transaminitis: Likely due to COVID-19.  Acute bilateral peroneal lower extremity DVT: Now on IV heparin, CT angio of the chest was negative for PE D-dimer has been persistently above 5 CRP continues to be elevated, but they are both trending down continue IV heparin. She is obese and her oxygen requirements were going up on IV Lovenox so on 05/29/2020 she was switched to IV heparin.  Type 2 diabetes mellitus with  hyperglycemia, without long-term current use of insulin (HCC) Her blood glucose slowly improving with the titration of the steroids, continue long-acting insulin plus sliding scale. Increase long-acting insulin continue sliding scale insulin.  Class 3 severe obesity with serious comorbidity and body mass index (BMI) of 50.0 to 59.9 in adult [facility](HCC) Counseling.  DVT prophylaxis: lovenox Family Communication:none Status is: Inpatient  Remains inpatient appropriate because:Hemodynamically unstable   Dispo: The patient is from: Home              Anticipated d/c is to: Home              Anticipated d/c date is: > 3 days              Patient currently is not medically stable to d/c.        Code Status:     Code Status Orders  (From admission, onward)         Start     Ordered   05/20/20 2129  Full code  Continuous        05/20/20 2129        Code Status History    This patient has a current code status but no historical code status.   Advance Care Planning Activity        IV Access:    Peripheral IV   Procedures and diagnostic studies:   CT ANGIO CHEST PE W OR WO CONTRAST  Result Date: 05/29/2020 CLINICAL DATA:  Shortness of breath.  COVID-19 positive. EXAM: CT ANGIOGRAPHY CHEST WITH CONTRAST TECHNIQUE: Multidetector CT imaging of the chest was performed using the standard protocol during bolus administration of intravenous contrast. Multiplanar CT  image reconstructions and MIPs were obtained to evaluate the vascular anatomy. CONTRAST:  OMNIPAQUE IOHEXOL 350 MG/ML SOLN COMPARISON:  Chest x-ray May 27, 2020 FINDINGS: Cardiovascular: Satisfactory opacification of the pulmonary arteries to the segmental level. No evidence of pulmonary embolism. Normal heart size. No pericardial effusion. Mediastinum/Nodes: There is a small hiatal hernia. There is a prominent prevascular node measuring 10 mm, likely reactive. No effusions. The thyroid is unremarkable.  Lungs/Pleura: Central airways are normal. No pneumothorax. Diffuse ground-glass patchy pulmonary infiltrates are identified. Upper Abdomen: No acute abnormality. Musculoskeletal: No chest wall abnormality. No acute or significant osseous findings. Review of the MIP images confirms the above findings. IMPRESSION: 1. Diffuse patchy bilateral ground-glass opacities consistent with multifocal pneumonia. The findings are consistent with COVID-19 pneumonia given history. 2. No pulmonary emboli identified.  No other acute abnormalities. Electronically Signed   By: Gerome Sam III M.D   On: 05/29/2020 18:17     Medical Consultants:    None.  Anti-Infectives:   Baricitinib IV Vanco and cefepime.  Subjective:    Diane Padilla relates her breathing is better than yesterday she was able to ambulate to the chair and relates that her breathing was not as bad as 2 days prior.  Objective:    Vitals:   05/30/20 0334 05/30/20 0406 05/30/20 0500 05/30/20 0600  BP:   (!) 144/78 (!) 156/73  Pulse:   98 (!) 103  Resp:   (!) 27 (!) 32  Temp:  99.7 F (37.6 C)    TempSrc:  Axillary    SpO2: (!) 88%  (!) 88% (!) 89%  Weight:      Height:       SpO2: (!) 89 % O2 Flow Rate (L/min): 15 L/min FiO2 (%): 90 %   Intake/Output Summary (Last 24 hours) at 05/30/2020 0737 Last data filed at 05/30/2020 0604 Gross per 24 hour  Intake 475.91 ml  Output 1725 ml  Net -1249.09 ml   Filed Weights   05/20/20 2118  Weight: (!) 139.2 kg    Exam: General exam: In no acute distress. Respiratory system: Good air movement and diffuse crackles Cardiovascular system: S1 & S2 heard, RRR. No JVD. Gastrointestinal system: Abdomen is nondistended, soft and nontender.  Extremities: No pedal edema. Skin: No rashes, lesions or ulcers Psychiatry: Judgement and insight appear normal. Mood & affect appropriate.   Data Reviewed:    Labs: Basic Metabolic Panel: Recent Labs  Lab 05/24/20 0326 05/24/20 0326  05/25/20 4098 05/25/20 1191 05/26/20 0319 05/26/20 0319 05/28/20 0319 05/28/20 0319 05/29/20 0310 05/30/20 0237  NA 139   < > 137  --  136  --  133*  --  134* 134*  K 4.8   < > 4.7   < > 4.9   < > 4.7   < > 5.1 4.6  CL 103   < > 100  --  102  --  101  --  101 101  CO2 25   < > 28  --  23  --  21*  --  22 23  GLUCOSE 270*   < > 235*  --  240*  --  201*  --  258* 230*  BUN 18   < > 22*  --  19  --  22*  --  24* 22*  CREATININE 0.77   < > 0.66  --  0.65  --  0.68  --  0.69 0.65  CALCIUM 8.4*   < > 8.5*  --  8.7*  --  8.8*  --  8.9 9.1  MG 3.1*  --  2.8*  --   --   --   --   --   --   --   PHOS 3.7  --  4.1  --   --   --   --   --   --   --    < > = values in this interval not displayed.   GFR Estimated Creatinine Clearance: 138 mL/min (by C-G formula based on SCr of 0.65 mg/dL). Liver Function Tests: Recent Labs  Lab 05/24/20 0326 05/25/20 0322 05/26/20 0319 05/28/20 0319 05/29/20 0310  AST 70* 41 38 59* 54*  ALT 99* 79* 77* 155* 172*  ALKPHOS 83 96 89 100 103  BILITOT 0.4 0.8 0.7 0.8 0.5  PROT 7.3 7.5 7.4 8.1 7.5  ALBUMIN 2.8* 2.9* 2.9* 3.1* 3.0*   No results for input(s): LIPASE, AMYLASE in the last 168 hours. No results for input(s): AMMONIA in the last 168 hours. Coagulation profile No results for input(s): INR, PROTIME in the last 168 hours. COVID-19 Labs  Recent Labs    05/29/20 0310 05/29/20 0940 05/30/20 0237  DDIMER  --  6.34* 5.75*  CRP 5.0* 4.3* 2.9*    Lab Results  Component Value Date   SARSCOV2NAA POSITIVE (A) 05/20/2020    CBC: Recent Labs  Lab 05/24/20 0326 05/24/20 0326 05/25/20 0322 05/27/20 1505 05/28/20 0319 05/29/20 2239 05/30/20 0237  WBC 15.8*   < > 16.5* 24.8* 25.3* 23.5* 24.2*  NEUTROABS 13.0*  --  14.1* 21.6*  --   --  20.7*  HGB 12.1   < > 12.8 14.1 13.7 12.5 12.7  HCT 37.8   < > 39.3 42.6 41.8 39.1 38.9  MCV 84.4   < > 82.4 82.4 82.9 83.7 83.1  PLT 269   < > 319 441* 447* 497* 530*   < > = values in this interval not  displayed.   Cardiac Enzymes: No results for input(s): CKTOTAL, CKMB, CKMBINDEX, TROPONINI in the last 168 hours. BNP (last 3 results) No results for input(s): PROBNP in the last 8760 hours. CBG: Recent Labs  Lab 05/29/20 1214 05/29/20 1653 05/29/20 2021 05/29/20 2338 05/30/20 0354  GLUCAP 356* 292* 263* 257* 196*   D-Dimer: Recent Labs    05/29/20 0940 05/30/20 0237  DDIMER 6.34* 5.75*   Hgb A1c: No results for input(s): HGBA1C in the last 72 hours. Lipid Profile: No results for input(s): CHOL, HDL, LDLCALC, TRIG, CHOLHDL, LDLDIRECT in the last 72 hours. Thyroid function studies: No results for input(s): TSH, T4TOTAL, T3FREE, THYROIDAB in the last 72 hours.  Invalid input(s): FREET3 Anemia work up: No results for input(s): VITAMINB12, FOLATE, FERRITIN, TIBC, IRON, RETICCTPCT in the last 72 hours. Sepsis Labs: Recent Labs  Lab 05/27/20 1505 05/28/20 0319 05/29/20 2239 05/30/20 0237  PROCALCITON 0.15  --   --   --   WBC 24.8* 25.3* 23.5* 24.2*   Microbiology Recent Results (from the past 240 hour(s))  Resp Panel by RT-PCR (Flu A&B, Covid) Nasopharyngeal Swab     Status: Abnormal   Collection Time: 05/20/20  2:59 PM   Specimen: Nasopharyngeal Swab; Nasopharyngeal(NP) swabs in vial transport medium  Result Value Ref Range Status   SARS Coronavirus 2 by RT PCR POSITIVE (A) NEGATIVE Final    Comment: RESULT CALLED TO, READ BACK BY AND VERIFIED WITH: LAMB, S RN 1709 05/20/20 JM (NOTE) SARS-CoV-2 target nucleic acids are DETECTED.  The  SARS-CoV-2 RNA is generally detectable in upper respiratory specimens during the acute phase of infection. Positive results are indicative of the presence of the identified virus, but do not rule out bacterial infection or co-infection with other pathogens not detected by the test. Clinical correlation with patient history and other diagnostic information is necessary to determine patient infection status. The expected result is  Negative.  Fact Sheet for Patients: BloggerCourse.com  Fact Sheet for Healthcare Providers: SeriousBroker.it  This test is not yet approved or cleared by the [place] FDA and  has been authorized for detection and/or diagnosis of SARS-CoV-2 by FDA under an Emergency Use Authorization (EUA).  This EUA will remain in effect (meaning this test can be used)  for the duration of  the COVID-19 declaration under Section 564(b)(1) of the Act, 21 U.S.C. section 360bbb-3(b)(1), unless the authorization is terminated or revoked sooner.     Influenza A by PCR NEGATIVE NEGATIVE Final   Influenza B by PCR NEGATIVE NEGATIVE Final    Comment: (NOTE) The Xpert Xpress SARS-CoV-2/FLU/RSV plus assay is intended as an aid in the diagnosis of influenza from Nasopharyngeal swab specimens and should not be used as a sole basis for treatment. Nasal washings and aspirates are unacceptable for Xpert Xpress SARS-CoV-2/FLU/RSV testing.  Fact Sheet for Patients: BloggerCourse.com  Fact Sheet for Healthcare Providers: SeriousBroker.it  This test is not yet approved or cleared by the [place] FDA and has been authorized for detection and/or diagnosis of SARS-CoV-2 by FDA under an Emergency Use Authorization (EUA). This EUA will remain in effect (meaning this test can be used) for the duration of the COVID-19 declaration under Section 564(b)(1) of the Act, 21 U.S.C. section 360bbb-3(b)(1), unless the authorization is terminated or revoked.  Performed at [facility], [address] [place], [place] [address]   Blood Culture (routine x 2)     Status: None   Collection Time: 05/20/20  2:59 PM   Specimen: BLOOD  Result Value Ref Range Status   Specimen Description   Final    BLOOD SITE NOT SPECIFIED Performed at [facility], [address] [place], [place] [address]     Special Requests   Final    BOTTLES DRAWN AEROBIC AND ANAEROBIC Blood Culture adequate volume Performed at [facility], [address] [place], [place] [address]    Culture   Final    NO GROWTH 5 DAYS Performed at [facility], [address] [place], [place] [address]    Report Status 05/25/2020 FINAL  Final  Blood Culture (routine x 2)     Status: None   Collection Time: 05/20/20  4:24 PM   Specimen: BLOOD RIGHT HAND  Result Value Ref Range Status   Specimen Description   Final    BLOOD RIGHT HAND Performed at [facility], [address] [place], [place] [address]    Special Requests   Final    BOTTLES DRAWN AEROBIC AND ANAEROBIC Blood Culture adequate volume Performed at [facility], [address] [place], [place] [address]    Culture   Final    NO GROWTH 5 DAYS Performed at [facility], [address], [place], [place] [address]    Report Status 05/25/2020 FINAL  Final  MRSA PCR Screening     Status: None   Collection Time: 05/22/20  3:32 AM   Specimen: Nasal Mucosa; Nasopharyngeal  Result Value Ref Range Status  MRSA by PCR NEGATIVE NEGATIVE Final    Comment:        The GeneXpert MRSA Assay (FDA approved for NASAL specimens only), is one component of a comprehensive MRSA colonization surveillance program. It is not intended to diagnose MRSA infection nor to guide or monitor treatment for MRSA infections. Performed at [facility], [address] [place], [place] [address]   Culture, blood (routine x 2)     Status: None (Preliminary result)   Collection Time: 05/27/20  3:05 PM   Specimen: BLOOD  Result Value Ref Range Status   Specimen Description   Final    BLOOD LEFT ANTECUBITAL Performed at [facility], [address] [place], [place] [address]    Special Requests   Final    BOTTLES DRAWN AEROBIC ONLY Blood Culture adequate  volume Performed at [facility], [address] [place], [place] [address]    Culture   Final    NO GROWTH 2 DAYS Performed at [facility], [address], [place], [place] [address]    Report Status PENDING  Incomplete  Culture, blood (routine x 2)     Status: None (Preliminary result)   Collection Time: 05/27/20  3:05 PM   Specimen: BLOOD LEFT HAND  Result Value Ref Range Status   Specimen Description   Final    BLOOD LEFT HAND Performed at [facility], [address] [place], [place] [address]    Special Requests   Final    BOTTLES DRAWN AEROBIC ONLY Blood Culture adequate volume Performed at [facility], [address], [place], [place] [address]    Culture   Final    NO GROWTH 2 DAYS Performed at [facility], [address] [place], [place] [address]    Report Status PENDING  Incomplete  MRSA PCR Screening     Status: None   Collection Time: 05/27/20  6:00 PM   Specimen: Nasal Mucosa; Nasopharyngeal  Result Value Ref Range Status   MRSA by PCR NEGATIVE NEGATIVE Final    Comment:        The GeneXpert MRSA Assay (FDA approved for NASAL specimens only), is one component of a comprehensive MRSA colonization surveillance program. It is not intended to diagnose MRSA infection nor to guide or monitor treatment for MRSA infections. Performed at [facility], [address] [place], [place] [address]      Medications:   . (feeding supplement) PROSource Plus  30 mL Oral TID BM  . albuterol  2 puff Inhalation Q4H  . baricitinib  4 mg Oral Daily  . Chlorhexidine Gluconate Cloth  6 each Topical Q0600  . feeding supplement (NEPRO CARB STEADY)  237 mL Oral TID BM  . hydrALAZINE  25 mg Oral Q8H  . insulin aspart  0-20 Units Subcutaneous TID WC  . insulin aspart  0-5 Units Subcutaneous QHS  . insulin aspart  6 Units Subcutaneous TID WC  . insulin glargine  20 Units Subcutaneous  BID  . linagliptin  5 mg Oral Daily  . mouth rinse  15 mL Mouth Rinse BID  . melatonin  5 mg Oral QHS  . methylPREDNISolone (SOLU-MEDROL) injection  40 mg Intravenous Q12H  . metoprolol tartrate  50 mg Oral BID  . multivitamin with minerals  1 tablet Oral Daily  . sodium chloride flush  3 mL Intravenous Q12H  . sodium chloride flush  3 mL Intravenous Q12H  Continuous Infusions: . sodium chloride Stopped (05/26/20 1130)  . ceFEPime (MAXIPIME) IV 2 g (05/30/20 0604)  . heparin 1,400 Units/hr (05/30/20 0300)      LOS: 10 days   Marinda Elk  [facility]  05/30/2020, 7:37 AM

## 2020-05-30 NOTE — Evaluation (Signed)
Physical Therapy Evaluation Patient Details Name: Diane Padilla MRN: [id number] DOB: 1985/09/08 Today's Date: 05/30/2020   History of Present Illness  Diane Padilla is an 34 y.o. female past medical history of diabetes mellitus type 2 obstructive sleep apnea obesity who presents with shortness of breath was found to be COVID-19 positive  Clinical Impression  Patient sitting in  Recliner on 80%/20 L HHFNC. Patient participated in theraband exercises with UE's. Patient stood at [facility] and performed 20 ,step in place.  Patient eager to participate and progress as tolerated. SPo2 dropped to 85%, RR35, recovered to 95%Pt admitted with above diagnosis.  Pt currently with functional limitations due to the deficits listed below (see PT Problem List). Pt will benefit from skilled PT to increase their independence and safety with mobility to allow discharge to the venue listed below.       Follow Up Recommendations Home health PT    Equipment Recommendations  None recommended by PT    Recommendations for Other Services       Precautions / Restrictions Precautions Precaution Comments: monitor o2 sats and RR, 80%, 20 L, PNRB      Mobility  Bed Mobility               General bed mobility comments: in recliner    Transfers Overall transfer level: Needs assistance Equipment used: Rolling walker (2 wheeled) Transfers: Sit to/from Stand Sit to Stand: Min guard         General transfer comment: patient stood x 1, marched in place x 20. SPO2 855, RR 35, HR 110  Ambulation/Gait                Stairs            Wheelchair Mobility    Modified Rankin (Stroke Patients Only)       Balance Overall balance assessment: No apparent balance deficits (not formally assessed)                                           Pertinent Vitals/Pain Pain Assessment: No/denies pain    Home Living Family/patient expects to be discharged to:: Private  residence Living Arrangements: Spouse/significant other Available Help at Discharge: Family                  Prior Function                 Hand Dominance        Extremity/Trunk Assessment   Upper Extremity Assessment Upper Extremity Assessment: Overall WFL for tasks assessed    Lower Extremity Assessment Lower Extremity Assessment: Generalized weakness    Cervical / Trunk Assessment Cervical / Trunk Assessment: Normal  Communication   Communication: No difficulties  Cognition Arousal/Alertness: Awake/alert Behavior During Therapy: WFL for tasks assessed/performed Overall Cognitive Status: Within Functional Limits for tasks assessed                                        General Comments      Exercises General Exercises - Upper Extremity Shoulder Flexion: AROM;Both;Strengthening;Theraband;10 reps Theraband Level (Shoulder Flexion): Level 4 (Blue) Shoulder Extension: AROM;Strengthening;Both;10 reps;Theraband Theraband Level (Shoulder Extension): Level 4 (Blue) Shoulder ABduction: AROM;10 reps;Seated;Strengthening;Theraband Theraband Level (Shoulder Abduction): Level 4 (Blue) General Exercises - Lower Extremity Ankle  Circles/Pumps: AROM;Both;10 reps Long Arc Quad: AROM;Both;10 reps Hip Flexion/Marching: AROM;10 reps;Standing   Assessment/Plan    PT Assessment Patient needs continued PT services  PT Problem List Decreased strength;Decreased activity tolerance;Cardiopulmonary status limiting activity;Decreased mobility       PT Treatment Interventions DME instruction;Therapeutic activities;Gait training;Patient/family education;Therapeutic exercise;Functional mobility training    PT Goals (Current goals can be found in the Care Plan section)  Acute Rehab PT Goals Patient Stated Goal: to walk PT Goal Formulation: With patient Time For Goal Achievement: 06/13/20 Potential to Achieve Goals: Good    Frequency Min 3X/week    Barriers to discharge        Co-evaluation               AM-PAC PT "6 Clicks" Mobility  Outcome Measure Help needed turning from your back to your side while in a flat bed without using bedrails?: A Little Help needed moving from lying on your back to sitting on the side of a flat bed without using bedrails?: A Little Help needed moving to and from a bed to a chair (including a wheelchair)?: A Little Help needed standing up from a chair using your arms (e.g., wheelchair or bedside chair)?: A Little Help needed to walk in hospital room?: A Lot Help needed climbing 3-5 steps with a railing? : A Lot 6 Click Score: 16    End of Session Equipment Utilized During Treatment: Oxygen Activity Tolerance: Patient tolerated treatment well Patient left: in chair;with call bell/phone within reach Nurse Communication: Mobility status PT Visit Diagnosis: Difficulty in walking, not elsewhere classified (R26.2)    Time: 1225-1300 PT Time Calculation (min) (ACUTE ONLY): 35 min   Charges:   PT Evaluation $PT Eval Low Complexity: 1 Low PT Treatments $Therapeutic Activity: 8-22 mins        Blanchard Kelch PT Acute Rehabilitation Services Pager [phone] Office [phone]   Rada Hay 05/30/2020, 1:38 PM

## 2020-05-30 NOTE — Progress Notes (Signed)
ANTICOAGULATION CONSULT NOTE - Follow Up Consult  Pharmacy Consult for Heparin Indication: DVT  No Known Allergies  Patient Measurements: Height: 5\' 5"  (165.1 cm) Weight: (!) 139.2 kg (306 lb 14.1 oz) IBW/kg (Calculated) : 57 Heparin Dosing Weight: 92 kg  Vital Signs: Temp: 99.9 F (37.7 C) (12/06 0800) Temp Source: Oral (12/06 0800) BP: 156/73 (12/06 0600) Pulse Rate: 103 (12/06 0600)  Labs: Recent Labs    05/28/20 0319 05/28/20 0319 05/29/20 0310 05/29/20 2239 05/30/20 0237  HGB 13.7   < >  --  12.5 12.7  HCT 41.8  --   --  39.1 38.9  PLT 447*  --   --  497* 530*  HEPARINUNFRC  --   --   --  1.12*  --   CREATININE 0.68  --  0.69  --  0.65   < > = values in this interval not displayed.    Estimated Creatinine Clearance: 138 mL/min (by C-G formula based on SCr of 0.65 mg/dL).   Medications:  Infusions:  . sodium chloride Stopped (05/26/20 1130)  . ceFEPime (MAXIPIME) IV Stopped (05/30/20 0825)  . heparin 1,400 Units/hr (05/30/20 0300)    Assessment: Pt is a 34 year old female admitted with respiratory failure due to COVID-19 PNA. Previously on therapeutic LMWH for acute bilateral DVT, but pharmacy consulted on 12/5 to transition anticoagulation to heparin drip. Last dose of enoxaparin given @ 0625 on 12/5. -11/30: Venous doppler: + Bilateral DVT -12/5: CTA: negative for PE  Today, 05/30/2020   Heparin level 1.08, remains supratherapeutic despite decrease to heparin 1400 units/hr.    CBC: Hgb stable, WNL. Plt elevated  No bleeding or infusion related issues reported by RN.    Goal of Therapy:  Heparin level 0.3-0.7 units/ml Monitor platelets by anticoagulation protocol: Yes   Plan:  HOLD heparin x1 hour Decrease to heparin IV infusion at 1000 units/hr Heparin level 6 hours after rate change Daily heparin level and CBC Continue to monitor H&H and platelets   14/11/2019 PharmD, BCPS Clinical Pharmacist [facility] main pharmacy [phone] 05/30/2020  8:34 AM

## 2020-05-30 NOTE — Progress Notes (Signed)
ANTICOAGULATION CONSULT NOTE - Follow Up Consult  Pharmacy Consult for Heparin Indication: DVT  No Known Allergies  Patient Measurements: Height: 5\' 5"  (165.1 cm) Weight: (!) 139.2 kg (306 lb 14.1 oz) IBW/kg (Calculated) : 57 Heparin Dosing Weight: 92 kg  Vital Signs: Temp: 98.6 F (37 C) (12/06 1600) Temp Source: Axillary (12/06 1600) BP: 172/110 (12/06 1600) Pulse Rate: 115 (12/06 1600)  Labs: Recent Labs    05/28/20 0319 05/28/20 0319 05/29/20 0310 05/29/20 2239 05/30/20 0237 05/30/20 0834 05/30/20 1812  HGB 13.7   < >  --  12.5 12.7  --   --   HCT 41.8  --   --  39.1 38.9  --   --   PLT 447*  --   --  497* 530*  --   --   HEPARINUNFRC  --   --   --  1.12*  --  1.08* 0.37  CREATININE 0.68  --  0.69  --  0.65  --   --    < > = values in this interval not displayed.    Estimated Creatinine Clearance: 138 mL/min (by C-G formula based on SCr of 0.65 mg/dL).   Medications:  Infusions:  . sodium chloride Stopped (05/26/20 1130)  . ceFEPime (MAXIPIME) IV Stopped (05/30/20 1616)  . heparin 1,000 Units/hr (05/30/20 1300)    Assessment: Pt is a 34 year old female admitted with respiratory failure due to COVID-19 PNA. Previously on therapeutic LMWH for acute bilateral DVT, but pharmacy consulted on 12/5 to transition anticoagulation to heparin drip. Last dose of enoxaparin given @ 0625 on 12/5. -11/30: Venous doppler: + Bilateral DVT -12/5: CTA: negative for PE  Today, 05/30/2020   Heparin level 1.08, remains supratherapeutic despite decrease to heparin 1400 units/hr.    CBC: Hgb stable, WNL. Plt elevated  No bleeding or infusion related issues reported by RN.    Heparin held x 1 hr, rate reduced to 1000 units/hr  Follow up Heparin level ~ 1800 = 0.37 units/ml  Goal of Therapy:  Heparin level 0.3-0.7 units/ml Monitor platelets by anticoagulation protocol: Yes   Plan:  Continue heparin IV infusion at 1000 units/hr Heparin level in 8 hr - recheck  0200 Daily heparin level and CBC Continue to monitor H&H and platelets  14/11/2019 PharmD [facility] [phone] 05/30/2020, 7:33 PM

## 2020-05-30 NOTE — Progress Notes (Signed)
ANTICOAGULATION CONSULT NOTE  Pharmacy Consult for heparin Indication: VTE  No Known Allergies  Patient Measurements: Height: 5\' 5"  (165.1 cm) Weight: (!) 139.2 kg (306 lb 14.1 oz) IBW/kg (Calculated) : 57 Heparin dosing weight: 92 kg  Vital Signs: Temp: 99.8 F (37.7 C) (12/06 0000) Temp Source: Axillary (12/06 0000) Pulse Rate: 114 (12/05 1544)  Labs: Recent Labs    05/27/20 1505 05/27/20 1505 05/28/20 0319 05/29/20 0310 05/29/20 2239  HGB 14.1   < > 13.7  --  12.5  HCT 42.6  --  41.8  --  39.1  PLT 441*  --  447*  --  497*  HEPARINUNFRC  --   --   --   --  1.12*  CREATININE  --   --  0.68 0.69  --    < > = values in this interval not displayed.    Estimated Creatinine Clearance: 138 mL/min (by C-G formula based on SCr of 0.69 mg/dL).   Medical History: Past Medical History:  Diagnosis Date  . Allergy   . Back pain   . Depression   . Diabetes mellitus without complication (HCC) 07/20/13   no meds at this time  . Dyspnea   . GERD (gastroesophageal reflux disease)   . PCOS (polycystic ovarian syndrome)   . Sleep apnea   . Vitamin D deficiency     Assessment: Pt is a 34 year old female admitted with respiratory failure due to COVID-19 PNA. Currently on therapeutic LMWH for acute bilateral DVT. Pharmacy consulted on 12/5 to transition anticoagulation to heparin drip. Last dose of enoxaparin given @ 0625 on 12/5.  -11/30: Venous doppler: + Bilateral DVT -12/5: CTA: negative for PE  Today, 05/30/20  Initial heparin level 1.12 - elevated on 1600 units/hr.  Possible level is partially elevated from lingering effects of Lovenox.   CBC: Hgb stable, WNL. Plt elevated  No bleeding or infusion related issues reported by RN.    Verified lab drawn peripherally from opposite arm as heparin infusion.   Goal of Therapy:  HL = 0.3 - 0.7   Plan:   Hold heparin infusion x2h then resume at 1400 units/hr  Check heparin level 6hrs after heparin resumed  CBC, HL  daily while on heparin infusion  Monitor for signs of bleeding  14/06/21, PharmD 05/30/20 @ 12:34 AM

## 2020-05-31 DIAGNOSIS — J9601 Acute respiratory failure with hypoxia: Secondary | ICD-10-CM

## 2020-05-31 LAB — GLUCOSE, CAPILLARY
Glucose-Capillary: 157 mg/dL — ABNORMAL HIGH (ref 70–99)
Glucose-Capillary: 172 mg/dL — ABNORMAL HIGH (ref 70–99)
Glucose-Capillary: 180 mg/dL — ABNORMAL HIGH (ref 70–99)
Glucose-Capillary: 189 mg/dL — ABNORMAL HIGH (ref 70–99)
Glucose-Capillary: 228 mg/dL — ABNORMAL HIGH (ref 70–99)
Glucose-Capillary: 257 mg/dL — ABNORMAL HIGH (ref 70–99)

## 2020-05-31 LAB — BASIC METABOLIC PANEL
Anion gap: 11 (ref 5–15)
BUN: 17 mg/dL (ref 6–20)
CO2: 25 mmol/L (ref 22–32)
Calcium: 8.8 mg/dL — ABNORMAL LOW (ref 8.9–10.3)
Chloride: 96 mmol/L — ABNORMAL LOW (ref 98–111)
Creatinine, Ser: 0.53 mg/dL (ref 0.44–1.00)
GFR, Estimated: >60 mL/min (ref >60)
Glucose, Bld: 137 mg/dL — ABNORMAL HIGH (ref 70–99)
Potassium: 4 mmol/L (ref 3.5–5.1)
Sodium: 132 mmol/L — ABNORMAL LOW (ref 135–145)

## 2020-05-31 LAB — CBC WITH DIFFERENTIAL/PLATELET
Abs Immature Granulocytes: 0.21 10*3/uL — ABNORMAL HIGH (ref 0.00–0.07)
Basophils Absolute: 0 10*3/uL (ref 0.0–0.1)
Basophils Relative: 0 %
Eosinophils Absolute: 0.3 10*3/uL (ref 0.0–0.5)
Eosinophils Relative: 2 %
HCT: 37.5 % (ref 36.0–46.0)
Hemoglobin: 12.2 g/dL (ref 12.0–15.0)
Immature Granulocytes: 1 %
Lymphocytes Relative: 13 %
Lymphs Abs: 2.6 10*3/uL (ref 0.7–4.0)
MCH: 27 pg (ref 26.0–34.0)
MCHC: 32.5 g/dL (ref 30.0–36.0)
MCV: 83 fL (ref 80.0–100.0)
Monocytes Absolute: 1 10*3/uL (ref 0.1–1.0)
Monocytes Relative: 5 %
Neutro Abs: 16.4 10*3/uL — ABNORMAL HIGH (ref 1.7–7.7)
Neutrophils Relative %: 79 %
Platelets: 382 10*3/uL (ref 150–400)
RBC: 4.52 MIL/uL (ref 3.87–5.11)
RDW: 14.1 % (ref 11.5–15.5)
WBC: 20.6 10*3/uL — ABNORMAL HIGH (ref 4.0–10.5)
nRBC: 0 % (ref 0.0–0.2)

## 2020-05-31 LAB — HEPARIN LEVEL (UNFRACTIONATED)
Heparin Unfractionated: 0.22 IU/mL — ABNORMAL LOW (ref 0.30–0.70)
Heparin Unfractionated: 3.26 IU/mL — ABNORMAL HIGH (ref 0.30–0.70)
Heparin Unfractionated: 0.28 IU/mL — ABNORMAL LOW (ref 0.30–0.70)

## 2020-05-31 LAB — C-REACTIVE PROTEIN: CRP: 3.4 mg/dL — ABNORMAL HIGH (ref <1.0)

## 2020-05-31 LAB — D-DIMER, QUANTITATIVE: D-Dimer, Quant: 6.89 ug/mL-FEU — ABNORMAL HIGH (ref 0.00–0.50)

## 2020-05-31 MED ORDER — METHYLPREDNISOLONE SODIUM SUCC 40 MG IJ SOLR
10.0000 mg | Freq: Two times a day (BID) | INTRAMUSCULAR | Status: DC
  Administered 2020-05-31 – 2020-06-03 (×6): 10 mg via INTRAVENOUS
  Filled 2020-05-31 (×6): qty 1

## 2020-05-31 MED ORDER — HEPARIN BOLUS VIA INFUSION
1500.0000 [IU] | Freq: Once | INTRAVENOUS | Status: AC
  Administered 2020-05-31: 1500 [IU] via INTRAVENOUS
  Filled 2020-05-31: qty 1500

## 2020-05-31 MED ORDER — AMLODIPINE BESYLATE 5 MG PO TABS
5.0000 mg | ORAL_TABLET | Freq: Every day | ORAL | Status: DC
  Administered 2020-05-31 – 2020-06-08 (×9): 5 mg via ORAL
  Filled 2020-05-31 (×9): qty 1

## 2020-05-31 MED ORDER — CHLORHEXIDINE GLUCONATE CLOTH 2 % EX PADS
6.0000 | MEDICATED_PAD | Freq: Every day | CUTANEOUS | Status: DC
  Administered 2020-06-01 – 2020-06-11 (×11): 6 via TOPICAL

## 2020-05-31 MED ORDER — METOPROLOL TARTRATE 25 MG PO TABS
75.0000 mg | ORAL_TABLET | Freq: Two times a day (BID) | ORAL | Status: DC
  Administered 2020-05-31 – 2020-06-03 (×8): 75 mg via ORAL
  Filled 2020-05-31 (×8): qty 3

## 2020-05-31 MED ORDER — HEPARIN (PORCINE) 25000 UT/250ML-% IV SOLN
1400.0000 [IU]/h | INTRAVENOUS | Status: DC
  Administered 2020-05-31: 1300 [IU]/h via INTRAVENOUS
  Filled 2020-05-31: qty 250

## 2020-05-31 MED ORDER — HYDRALAZINE HCL 50 MG PO TABS
50.0000 mg | ORAL_TABLET | Freq: Three times a day (TID) | ORAL | Status: DC
  Administered 2020-05-31 – 2020-06-07 (×20): 50 mg via ORAL
  Filled 2020-05-31 (×20): qty 1

## 2020-05-31 NOTE — Progress Notes (Signed)
ANTICOAGULATION CONSULT NOTE - Follow Up Consult  Pharmacy Consult for Heparin Indication: DVT  No Known Allergies  Patient Measurements: Height: 5\' 5"  (165.1 cm) Weight: (!) 139.2 kg (306 lb 14.1 oz) IBW/kg (Calculated) : 57 Heparin Dosing Weight: 92 kg  Vital Signs: Temp: 99.1 F (37.3 C) (12/07 0429) Temp Source: Axillary (12/07 0429) BP: 148/76 (12/07 0600) Pulse Rate: 113 (12/07 0600)  Labs: Recent Labs    05/29/20 0310 05/29/20 2239 05/29/20 2239 05/30/20 0237 05/30/20 0834 05/30/20 1812 05/31/20 0557  HGB  --  12.5   < > 12.7  --   --  12.2  HCT  --  39.1  --  38.9  --   --  37.5  PLT  --  497*  --  530*  --   --  382  HEPARINUNFRC  --  1.12*   < >  --  1.08* 0.37 0.22*  CREATININE 0.69  --   --  0.65  --   --  0.53   < > = values in this interval not displayed.    Estimated Creatinine Clearance: 138 mL/min (by C-G formula based on SCr of 0.53 mg/dL).   Medications:  Infusions:  . sodium chloride Stopped (05/31/20 0555)  . ceFEPime (MAXIPIME) IV Stopped (05/30/20 2344)  . heparin 1,000 Units/hr (05/31/20 0600)    Assessment: Pt is a 34 year old female admitted with respiratory failure due to COVID-19 PNA. Previously on therapeutic LMWH for acute bilateral DVT, but pharmacy consulted on 12/5 to transition anticoagulation to heparin drip. Last dose of enoxaparin given @ 0625 on 12/5. -11/30: Venous doppler: + Bilateral DVT -12/5: CTA: negative for PE  Today, 05/31/2020   Heparin level decreased to 0.22, sub-therapeutic on heparin 1000 units/hr.    CBC: Hgb stable, WNL. Plt decreased to WNL  No bleeding or infusion related issues reported by 14/12/2019 RN.  Goal of Therapy:  Heparin level 0.3-0.7 units/ml Monitor platelets by anticoagulation protocol: Yes   Plan:  Bolus Heparin 1500 units IV x1 dose Increase to heparin IV infusion at 1200 units/hr Heparin level 6 hours after rate change Daily heparin level and CBC Continue to monitor H&H and  platelets   Herbert Seta PharmD, BCPS Clinical Pharmacist [facility] [phone] 05/31/2020 7:31 AM

## 2020-05-31 NOTE — Progress Notes (Signed)
NAME:  Diane Padilla, MRN:  [id number], DOB:  07-Sep-1985, LOS: 11 ADMISSION DATE:  05/20/2020, CONSULTATION DATE:  11/29 REFERRING MD:  Lafe Garin, CHIEF COMPLAINT:  Dyspnea   Brief History   35 y/o female admitted 11/25 with severe acute respiratory failure with hypoxemia due to COVID 19 pneumonia, moved to the ICU on 11/29. Has been on heated high flow oxygen since then.   Past Medical History  GERD PCOS OSA DM2 Depression  Significant Hospital Events   11/26 admit. Oxygen and steroids started 11/27 Baricitinib started 11/28 Remdesivir started  11/29 O12 needs increased to 30 L high flow at 100% in addition to nonrebreather. PCCM consulted 11/30 bilateral LE DVTs therapeutic AC started 12/02 completed Remdesivir 12/2 12/03 HR elevated. Slightly hypertensive. But improved spont. Attempting BIPAP at HS and PRN (CPAP at home) 12/06 Unable to wear BiPAP, feels better overall 12/07 20L flow / 60% fiO2   Consults:  PCCM  Procedures:    Significant Diagnostic Tests:   LE Venous Duplex 11/30 >> positive for acute DVT in L peroneal veins, and R peroneal veins   Micro Data:  COVID 11/26 >> positive  Influenza 11/26 >> negative  BCx2 11/26 >> negative  BCx2 12/3 >>   Antimicrobials:  Baricitinib 11/27 >>  Remdesivir 11/27 >>  Vancomycin 12/3 >>  Cefepime 12/3 >>   Interim history/subjective:  25L flow / 60% fiO2  Glucose range 137 - 256  Afebrile  I/O 900 ml UOP, +483ml in last 24 hours  Pt reports feeling better.   Objective   Blood pressure (!) 157/72, pulse (!) 115, temperature 99.7 F (37.6 C), temperature source Axillary, resp. rate (!) 30, height 5\' 5"  (1.651 m), weight (!) 139.2 kg, last menstrual period 05/07/2020, SpO2 94 %.    FiO2 (%):  [60 %-70 %] 60 %   Intake/Output Summary (Last 24 hours) at 05/31/2020 0900 Last data filed at 05/31/2020 0600 Gross per 24 hour  Intake 1323.52 ml  Output 900 ml  Net 423.52 ml   Filed Weights   05/20/20 2118   Weight: (!) 139.2 kg    Examination: General: young adult female lying in bed in NAD  HEENT: MM pink/moist, [place] + NRB O2, anicteric  Neuro: AAOx4, speech clear, MAE / normal strength CV: s1s2 RRR, no m/r/g PULM: mild tachypnea but non-labored, lungs bilaterally diminished  GI: soft, bsx4 active  Extremities: warm/dry, no edema  Skin: no rashes or lesions  Resolved Hospital Problem list     Assessment & Plan:   ARDS due to COVID 19 PNA Possible HCAP -continue baricitinib   -continue empiric cefepime, D5/7  -wean O2 for sats >85% at rest, >75% with exertion  -mobilize / OOB  -prone positioning as tolerated  -push pulmonary hygiene, nutrition and sleep cycle  OSA on CPAP -continue QHS CPAP as tolerated  -O2 at night if not able to wear CPAP  -will need outpatient sleep evaluation after discharge   Best practice (evaluated daily)  Per Primary  PCCM will follow up again 12/9, please call if new needs arise in the interim.   Labs   CBC: Recent Labs  Lab 05/25/20 0322 05/25/20 0322 05/27/20 1505 05/28/20 0319 05/29/20 2239 05/30/20 0237 05/31/20 0557  WBC 16.5*   < > 24.8* 25.3* 23.5* 24.2* 20.6*  NEUTROABS 14.1*  --  21.6*  --   --  20.7* 16.4*  HGB 12.8   < > 14.1 13.7 12.5 12.7 12.2  HCT 39.3   < >  42.6 41.8 39.1 38.9 37.5  MCV 82.4   < > 82.4 82.9 83.7 83.1 83.0  PLT 319   < > 441* 447* 497* 530* 382   < > = values in this interval not displayed.    Basic Metabolic Panel: Recent Labs  Lab 05/25/20 0322 05/25/20 0322 05/26/20 0319 05/28/20 0319 05/29/20 0310 05/30/20 0237 05/31/20 0557  NA 137   < > 136 133* 134* 134* 132*  K 4.7   < > 4.9 4.7 5.1 4.6 4.0  CL 100   < > 102 101 101 101 96*  CO2 28   < > 23 21* 22 23 25   GLUCOSE 235*   < > 240* 201* 258* 230* 137*  BUN 22*   < > 19 22* 24* 22* 17  CREATININE 0.66   < > 0.65 0.68 0.69 0.65 0.53  CALCIUM 8.5*   < > 8.7* 8.8* 8.9 9.1 8.8*  MG 2.8*  --   --   --   --   --   --   PHOS 4.1  --   --    --   --   --   --    < > = values in this interval not displayed.   GFR: Estimated Creatinine Clearance: 138 mL/min (by C-G formula based on SCr of 0.53 mg/dL). Recent Labs  Lab 05/27/20 1505 05/27/20 1505 05/28/20 0319 05/29/20 2239 05/30/20 0237 05/31/20 0557  PROCALCITON 0.15  --   --   --   --   --   WBC 24.8*   < > 25.3* 23.5* 24.2* 20.6*   < > = values in this interval not displayed.    Liver Function Tests: Recent Labs  Lab 05/25/20 0322 05/26/20 0319 05/28/20 0319 05/29/20 0310  AST 41 38 59* 54*  ALT 79* 77* 155* 172*  ALKPHOS 96 89 100 103  BILITOT 0.8 0.7 0.8 0.5  PROT 7.5 7.4 8.1 7.5  ALBUMIN 2.9* 2.9* 3.1* 3.0*   No results for input(s): LIPASE, AMYLASE in the last 168 hours. No results for input(s): AMMONIA in the last 168 hours.  ABG No results found for: PHART, PCO2ART, PO2ART, HCO3, TCO2, ACIDBASEDEF, O2SAT   Coagulation Profile: No results for input(s): INR, PROTIME in the last 168 hours.  Cardiac Enzymes: No results for input(s): CKTOTAL, CKMB, CKMBINDEX, TROPONINI in the last 168 hours.  HbA1C: Hgb A1c MFr Bld  Date/Time Value Ref Range Status  05/21/2020 04:38 AM 6.6 (H) 4.8 - 5.6 % Final    Comment:    (NOTE) Pre diabetes:          5.7%-6.4%  Diabetes:              >6.4%  Glycemic control for   <7.0% adults with diabetes   01/19/2019 12:19 PM 6.1 (H) 4.8 - 5.6 % Final    Comment:             Prediabetes: 5.7 - 6.4          Diabetes: >6.4          Glycemic control for adults with diabetes: <7.0     CBG: Recent Labs  Lab 05/30/20 1630 05/30/20 2103 05/30/20 2353 05/31/20 0423 05/31/20 0849  GLUCAP 243* 230* 256* 157* 172*    Critical Care Time: n/a  14/07/21, MSN, NP-C, AGACNP-BC [facility] 05/31/2020, 9:00 AM   Please see [email or web address] for pager details.

## 2020-05-31 NOTE — Progress Notes (Signed)
Nutrition Follow-up  RD working remotely.  DOCUMENTATION CODES:   Morbid obesity  INTERVENTION:  - continue Nepro Shake TID and Prosource Plus TID. - will d/c [employer].  - re-weigh patient today.   NUTRITION DIAGNOSIS:   Increased nutrient needs related to acute illness, catabolic illness (COVID-19 infection) as evidenced by estimated needs -ongoing  GOAL:   Patient will meet greater than or equal to 90% of their needs -unmet  MONITOR:   PO intake, Supplement acceptance, Labs, Weight trends  ASSESSMENT:   34 year-old female with medical history of DM, PCOS, GERD, vitamin D deficiency, depression and OSA on CPAP. She was in her usual state of health until 11/18 when she developed dry, hacking cough and was found to be COVID-19 positive. Symptoms progressed to include loss of taste and smell, fever, chills, and shortness of breath. She presented to the ED on 11/26 due to shortness of breath and was found to be 84% on room air. On 11/29 she progressed to require 30 L via HFNC and NRB.  The most recently documented meal intakes were 0% of lunch and dinner on 12/3 and 50% of breakfast on 12/5.   She has been accepting Prosource Plus ~90% of the time offered. Nepro Shake was ordered TID starting 12/4 and she has been accepting this supplement 100% of the time offered.   She has not been weighed since admission date of 11/26. Non-pitting edema to BLE documented in the flow sheet.   Per notes: - ARDS 2/2 COVID PNA - self proning as able - OSA requiring CPAP   Labs reviewed; CBGs: 157, 172, 257 mg/dl, Na: 885 mmol/l, Cl: 96 mmol/l. Medications reviewed; sliding scale novolog, 6 units novolog TID, 25 units lantus BID, 5 mg tradjenta/day, 5 mg melatonin/day, 10 mg solu-medrol BID, 1 tablet multivitamin with minerals/day.   NUTRITION - FOCUSED PHYSICAL EXAM:  unable to complete at this time.   Diet Order:   Diet Order            Diet Carb Modified Fluid  consistency: Thin; Room service appropriate? Yes  Diet effective now                 EDUCATION NEEDS:   Not appropriate for education at this time  Skin:  Skin Assessment: Reviewed RN Assessment  Last BM:  12/4  Height:   Ht Readings from Last 1 Encounters:  05/31/20 5\' 5"  (1.651 m)    Weight:   Wt Readings from Last 1 Encounters:  05/20/20 (!) 139.2 kg     Estimated Nutritional Needs:  Kcal:  2000-2200 kcal Protein:  100-115 grams Fluid:  >/= 2.2 L/day      05/22/20, MS, RD, LDN, CNSC Inpatient Clinical Dietitian RD pager # available in AMION  After hours/weekend pager # available in [facility]

## 2020-05-31 NOTE — Progress Notes (Signed)
Pharmacy: Re- heparin  Patient is a 34 y.o F with COVID-19 currently on heparin drip for acute LE DVT.  - heparin level at 1719 came back elevated at 3.26 but was not drawn correctly. Repeat level at 1916 came back sub-therapeutic at 0.28 with drip running at 1200 units/hr  Goal of Therapy:  Heparin level 0.3-0.7 units/ml Monitor platelets by anticoagulation protocol: Yes  Plan: - Increase heparin drip to 1300 units/hr - check 6 hr heparin level after rate change - monitor for s/sx bleeding   Dorna Leitz, PharmD, BCPS 05/31/2020 8:01 PM

## 2020-05-31 NOTE — Plan of Care (Signed)
Discussed with patient plan of care for the evening, pain management and importance of NRBM with some teach back displayed.  Problem: Education: Goal: Knowledge of risk factors and measures for prevention of condition will improve Outcome: Progressing   Problem: Coping: Goal: Psychosocial and spiritual needs will be supported Outcome: Progressing   Problem: Respiratory: Goal: Will maintain a patent airway Outcome: Progressing

## 2020-05-31 NOTE — Progress Notes (Signed)
[facility] PROGRESS NOTE    Progress Note  Diane Padilla  ZOX:[id number] DOB: 1986/02/01 DOA: 05/20/2020 PCP: [facility]     Brief Narrative:   Diane Padilla is an 34 y.o. female past medical history of diabetes mellitus type 2 obstructive sleep apnea obesity who presents with shortness of breath was found to be COVID-19 positive requiring 16 L was admitted to the ICU on steroids initially declined other therapies, after discussion and her declining clinical status she agreed to IV remdesivir and baricitinib, lower extremity Doppler was positive for acute DVTs she was started on Lovenox 05/24/2020 she has remained severely hypoxic.  Antibiotics were started on December 3.  Assessment/Plan:   Acute respiratory failure with hypoxia due to COVID-19 pneumonia/and possibly ARDS: Mrs. Diane Padilla oxygen requirements are minimally better compared to yesterday her FiO2 is coming down she still breathing about 20-30 times per minute and she is mildly tachycardic this morning again. Her inflammatory markers are persistently elevated, her D-dimer is trending up, she is on IV heparin due to her episode of DVT. Continue to wean down steroids, continue oral baricitinib. We will continue antibiotics for total of 5 days. CCM on board appreciate assistance. Encourage being in the prone position while in bed.  Transaminitis: Likely due to COVID-19.  Acute bilateral peroneal lower extremity DVT: Continue IV heparin, CT angio negative for PE her D-dimer is persistently above 5.  Type 2 diabetes mellitus with hyperglycemia, without long-term current use of insulin (HCC) Her blood glucose continues to improve as we decrease steroids, and regimen of long-acting insulin plus sliding scale.  Continues to Linagliptin.   Class 3 severe obesity with serious comorbidity and body mass index (BMI) of 50.0 to 59.9 in adult [facility]) Counseling.  DVT prophylaxis:  lovenox Family Communication:none Status is: Inpatient  Remains inpatient appropriate because:Hemodynamically unstable   Dispo: The patient is from: Home              Anticipated d/c is to: Home              Anticipated d/c date is: > 3 days              Patient currently is not medically stable to d/c.   Code Status:     Code Status Orders  (From admission, onward)         Start     Ordered   05/20/20 2129  Full code  Continuous        05/20/20 2129        Code Status History    This patient has a current code status but no historical code status.   Advance Care Planning Activity        IV Access:    Peripheral IV   Procedures and diagnostic studies:   CT ANGIO CHEST PE W OR WO CONTRAST  Result Date: 05/29/2020 CLINICAL DATA:  Shortness of breath.  COVID-19 positive. EXAM: CT ANGIOGRAPHY CHEST WITH CONTRAST TECHNIQUE: Multidetector CT imaging of the chest was performed using the standard protocol during bolus administration of intravenous contrast. Multiplanar CT image reconstructions and MIPs were obtained to evaluate the vascular anatomy. CONTRAST:  OMNIPAQUE IOHEXOL 350 MG/ML SOLN COMPARISON:  Chest x-ray May 27, 2020 FINDINGS: Cardiovascular: Satisfactory opacification of the pulmonary arteries to the segmental level. No evidence of pulmonary embolism. Normal heart size. No pericardial effusion. Mediastinum/Nodes: There is a small hiatal hernia. There is a prominent prevascular node measuring 10 mm,  likely reactive. No effusions. The thyroid is unremarkable. Lungs/Pleura: Central airways are normal. No pneumothorax. Diffuse ground-glass patchy pulmonary infiltrates are identified. Upper Abdomen: No acute abnormality. Musculoskeletal: No chest wall abnormality. No acute or significant osseous findings. Review of the MIP images confirms the above findings. IMPRESSION: 1. Diffuse patchy bilateral ground-glass opacities consistent with multifocal pneumonia. The  findings are consistent with COVID-19 pneumonia given history. 2. No pulmonary emboli identified.  No other acute abnormalities. Electronically Signed   By: Gerome Sam III M.D   On: 05/29/2020 18:17     Medical Consultants:    None.  Anti-Infectives:   Baricitinib IV Vanco and cefepime.  Subjective:    Diane Padilla relates he feels good working with physical therapy still winded when she tries to go to the chair has not been able to spend a lot of time proning.  Objective:    Vitals:   05/31/20 0400 05/31/20 0429 05/31/20 0500 05/31/20 0600  BP: (!) 157/73 (!) 157/73 (!) 160/77 (!) 148/76  Pulse: 100  (!) 101 (!) 113  Resp: (!) 38  (!) 37 (!) 38  Temp:  99.1 F (37.3 C)    TempSrc:  Axillary    SpO2: 94%  94% 95%  Weight:      Height:       SpO2: 95 % O2 Flow Rate (L/min): 25 L/min FiO2 (%): 60 %   Intake/Output Summary (Last 24 hours) at 05/31/2020 0711 Last data filed at 05/31/2020 0600 Gross per 24 hour  Intake 1323.52 ml  Output 900 ml  Net 423.52 ml   Filed Weights   05/20/20 2118  Weight: (!) 139.2 kg    Exam: General exam: In no acute distress. Respiratory system: Good air movement and diffuse crackles bilaterally. Cardiovascular system: S1 & S2 heard, RRR. No JVD. Gastrointestinal system: Abdomen is nondistended, soft and nontender.  Extremities: No pedal edema. Skin: No rashes, lesions or ulcers Psychiatry: Judgement and insight appear normal. Mood & affect appropriate.   Data Reviewed:    Labs: Basic Metabolic Panel: Recent Labs  Lab 05/25/20 0322 05/25/20 0322 05/26/20 0319 05/26/20 0319 05/28/20 0319 05/28/20 0319 05/29/20 0310 05/29/20 0310 05/30/20 0237 05/31/20 0557  NA 137   < > 136  --  133*  --  134*  --  134* 132*  K 4.7   < > 4.9   < > 4.7   < > 5.1   < > 4.6 4.0  CL 100   < > 102  --  101  --  101  --  101 96*  CO2 28   < > 23  --  21*  --  22  --  23 25  GLUCOSE 235*   < > 240*  --  201*  --  258*  --  230*  137*  BUN 22*   < > 19  --  22*  --  24*  --  22* 17  CREATININE 0.66   < > 0.65  --  0.68  --  0.69  --  0.65 0.53  CALCIUM 8.5*   < > 8.7*  --  8.8*  --  8.9  --  9.1 8.8*  MG 2.8*  --   --   --   --   --   --   --   --   --   PHOS 4.1  --   --   --   --   --   --   --   --   --    < > =  values in this interval not displayed.   GFR Estimated Creatinine Clearance: 138 mL/min (by C-G formula based on SCr of 0.53 mg/dL). Liver Function Tests: Recent Labs  Lab 05/25/20 0322 05/26/20 0319 05/28/20 0319 05/29/20 0310  AST 41 38 59* 54*  ALT 79* 77* 155* 172*  ALKPHOS 96 89 100 103  BILITOT 0.8 0.7 0.8 0.5  PROT 7.5 7.4 8.1 7.5  ALBUMIN 2.9* 2.9* 3.1* 3.0*   No results for input(s): LIPASE, AMYLASE in the last 168 hours. No results for input(s): AMMONIA in the last 168 hours. Coagulation profile No results for input(s): INR, PROTIME in the last 168 hours. COVID-19 Labs  Recent Labs    05/29/20 0310 05/29/20 0940 05/30/20 0237 05/31/20 0557  DDIMER  --  6.34* 5.75* 6.89*  CRP 5.0* 4.3* 2.9*  --     Lab Results  Component Value Date   SARSCOV2NAA POSITIVE (A) 05/20/2020    CBC: Recent Labs  Lab 05/25/20 0322 05/25/20 0322 05/27/20 1505 05/28/20 0319 05/29/20 2239 05/30/20 0237 05/31/20 0557  WBC 16.5*   < > 24.8* 25.3* 23.5* 24.2* 20.6*  NEUTROABS 14.1*  --  21.6*  --   --  20.7* 16.4*  HGB 12.8   < > 14.1 13.7 12.5 12.7 12.2  HCT 39.3   < > 42.6 41.8 39.1 38.9 37.5  MCV 82.4   < > 82.4 82.9 83.7 83.1 83.0  PLT 319   < > 441* 447* 497* 530* 382   < > = values in this interval not displayed.   Cardiac Enzymes: No results for input(s): CKTOTAL, CKMB, CKMBINDEX, TROPONINI in the last 168 hours. BNP (last 3 results) No results for input(s): PROBNP in the last 8760 hours. CBG: Recent Labs  Lab 05/30/20 1219 05/30/20 1630 05/30/20 2103 05/30/20 2353 05/31/20 0423  GLUCAP 311* 243* 230* 256* 157*   D-Dimer: Recent Labs    05/30/20 0237 05/31/20 0557   DDIMER 5.75* 6.89*   Hgb A1c: No results for input(s): HGBA1C in the last 72 hours. Lipid Profile: No results for input(s): CHOL, HDL, LDLCALC, TRIG, CHOLHDL, LDLDIRECT in the last 72 hours. Thyroid function studies: No results for input(s): TSH, T4TOTAL, T3FREE, THYROIDAB in the last 72 hours.  Invalid input(s): FREET3 Anemia work up: No results for input(s): VITAMINB12, FOLATE, FERRITIN, TIBC, IRON, RETICCTPCT in the last 72 hours. Sepsis Labs: Recent Labs  Lab 05/27/20 1505 05/27/20 1505 05/28/20 0319 05/29/20 2239 05/30/20 0237 05/31/20 0557  PROCALCITON 0.15  --   --   --   --   --   WBC 24.8*   < > 25.3* 23.5* 24.2* 20.6*   < > = values in this interval not displayed.   Microbiology Recent Results (from the past 240 hour(s))  MRSA PCR Screening     Status: None   Collection Time: 05/22/20  3:32 AM   Specimen: Nasal Mucosa; Nasopharyngeal  Result Value Ref Range Status   MRSA by PCR NEGATIVE NEGATIVE Final    Comment:        The GeneXpert MRSA Assay (FDA approved for NASAL specimens only), is one component of a comprehensive MRSA colonization surveillance program. It is not intended to diagnose MRSA infection nor to guide or monitor treatment for MRSA infections. Performed at [facility], [address] [place], [place] [id number]   Culture, blood (routine x 2)     Status: None (Preliminary result)   Collection Time: 05/27/20  3:05 PM   Specimen: BLOOD  Result  Value Ref Range Status   Specimen Description   Final    BLOOD LEFT ANTECUBITAL Performed at [facility], [address] [place], [place] [address]    Special Requests   Final    BOTTLES DRAWN AEROBIC ONLY Blood Culture adequate volume Performed at [facility], [address], [place], [place] [address]    Culture   Final    NO GROWTH 3 DAYS Performed at [facility], [address] [place], [place] [address]    Report Status  PENDING  Incomplete  Culture, blood (routine x 2)     Status: None (Preliminary result)   Collection Time: 05/27/20  3:05 PM   Specimen: BLOOD LEFT HAND  Result Value Ref Range Status   Specimen Description   Final    BLOOD LEFT HAND Performed at [facility], [address] [place], [place] [address]    Special Requests   Final    BOTTLES DRAWN AEROBIC ONLY Blood Culture adequate volume Performed at [facility], [address], [place], [place] [address]    Culture   Final    NO GROWTH 3 DAYS Performed at [facility], [address], [place], [place] [address]    Report Status PENDING  Incomplete  MRSA PCR Screening     Status: None   Collection Time: 05/27/20  6:00 PM   Specimen: Nasal Mucosa; Nasopharyngeal  Result Value Ref Range Status   MRSA by PCR NEGATIVE NEGATIVE Final    Comment:        The GeneXpert MRSA Assay (FDA approved for NASAL specimens only), is one component of a comprehensive MRSA colonization surveillance program. It is not intended to diagnose MRSA infection nor to guide or monitor treatment for MRSA infections. Performed at [facility], [address] [place], [place] [address]      Medications:   . (feeding supplement) PROSource Plus  30 mL Oral TID BM  . albuterol  2 puff Inhalation QID  . baricitinib  4 mg Oral Daily  . Chlorhexidine Gluconate Cloth  6 each Topical Q0600  . feeding supplement (NEPRO CARB STEADY)  237 mL Oral TID BM  . hydrALAZINE  25 mg Oral Q8H  . insulin aspart  0-20 Units Subcutaneous TID WC  . insulin aspart  0-5 Units Subcutaneous QHS  . insulin aspart  6 Units Subcutaneous TID WC  . insulin glargine  25 Units Subcutaneous BID  . linagliptin  5 mg Oral Daily  . mouth rinse  15 mL Mouth Rinse BID  . melatonin  5 mg Oral QHS  . methylPREDNISolone (SOLU-MEDROL) injection  20 mg Intravenous Q12H  . metoprolol tartrate  50 mg Oral BID  . multivitamin  with minerals  1 tablet Oral Daily  . sodium chloride flush  10-40 mL Intracatheter Q12H  . sodium chloride flush  3 mL Intravenous Q12H  . sodium chloride flush  3 mL Intravenous Q12H   Continuous Infusions: . sodium chloride Stopped (05/31/20 0555)  . ceFEPime (MAXIPIME) IV Stopped (05/30/20 2344)  . heparin 1,000 Units/hr (05/31/20 0600)      LOS: 11 days   Marinda Elk  [facility]  05/31/2020, 7:11 AM

## 2020-05-31 NOTE — TOC Progression Note (Signed)
Transition of Care [facility]) - Progression Note    Patient Details  Name: Diane Padilla MRN: [id number] Date of Birth: 16-Jan-1986  [facility] CM/SW Contact  Golda Acre, RN Phone Number: 05/31/2020, 8:31 AM  Clinical Narrative:    hfnrbat 25l/min -60%, Iv solu medrol, iv maxipine, iv hep. Plan is to return to home'following for progression   Expected Discharge Plan: Home/Self Care Barriers to Discharge: No Barriers Identified  Expected Discharge Plan and Services Expected Discharge Plan: Home/Self Care   Discharge Planning Services: CM Consult   Living arrangements for the past 2 months: Apartment                                       Social Determinants of Health (SDOH) Interventions    Readmission Risk Interventions No flowsheet data found.

## 2020-06-01 LAB — HEPARIN LEVEL (UNFRACTIONATED)
Heparin Unfractionated: 0.3 IU/mL (ref 0.30–0.70)
Heparin Unfractionated: 0.27 IU/mL — ABNORMAL LOW (ref 0.30–0.70)
Heparin Unfractionated: 0.42 IU/mL (ref 0.30–0.70)

## 2020-06-01 LAB — BASIC METABOLIC PANEL
Anion gap: 11 (ref 5–15)
BUN: 14 mg/dL (ref 6–20)
CO2: 24 mmol/L (ref 22–32)
Calcium: 9 mg/dL (ref 8.9–10.3)
Chloride: 97 mmol/L — ABNORMAL LOW (ref 98–111)
Creatinine, Ser: 0.59 mg/dL (ref 0.44–1.00)
GFR, Estimated: >60 mL/min (ref >60)
Glucose, Bld: 163 mg/dL — ABNORMAL HIGH (ref 70–99)
Potassium: 4.6 mmol/L (ref 3.5–5.1)
Sodium: 132 mmol/L — ABNORMAL LOW (ref 135–145)

## 2020-06-01 LAB — CBC WITH DIFFERENTIAL/PLATELET
Abs Immature Granulocytes: 0.18 10*3/uL — ABNORMAL HIGH (ref 0.00–0.07)
Basophils Absolute: 0 10*3/uL (ref 0.0–0.1)
Basophils Relative: 0 %
Eosinophils Absolute: 0.2 10*3/uL (ref 0.0–0.5)
Eosinophils Relative: 1 %
HCT: 37.7 % (ref 36.0–46.0)
Hemoglobin: 12.3 g/dL (ref 12.0–15.0)
Immature Granulocytes: 1 %
Lymphocytes Relative: 10 %
Lymphs Abs: 2.1 10*3/uL (ref 0.7–4.0)
MCH: 26.9 pg (ref 26.0–34.0)
MCHC: 32.6 g/dL (ref 30.0–36.0)
MCV: 82.5 fL (ref 80.0–100.0)
Monocytes Absolute: 0.8 10*3/uL (ref 0.1–1.0)
Monocytes Relative: 4 %
Neutro Abs: 17.6 10*3/uL — ABNORMAL HIGH (ref 1.7–7.7)
Neutrophils Relative %: 84 %
Platelets: 409 10*3/uL — ABNORMAL HIGH (ref 150–400)
RBC: 4.57 MIL/uL (ref 3.87–5.11)
RDW: 13.9 % (ref 11.5–15.5)
WBC: 20.8 10*3/uL — ABNORMAL HIGH (ref 4.0–10.5)
nRBC: 0 % (ref 0.0–0.2)

## 2020-06-01 LAB — CULTURE, BLOOD (ROUTINE X 2)
Culture: NO GROWTH
Culture: NO GROWTH
Special Requests: ADEQUATE
Special Requests: ADEQUATE

## 2020-06-01 LAB — D-DIMER, QUANTITATIVE: D-Dimer, Quant: 6.16 ug/mL-FEU — ABNORMAL HIGH (ref 0.00–0.50)

## 2020-06-01 LAB — GLUCOSE, CAPILLARY
Glucose-Capillary: 135 mg/dL — ABNORMAL HIGH (ref 70–99)
Glucose-Capillary: 249 mg/dL — ABNORMAL HIGH (ref 70–99)

## 2020-06-01 LAB — C-REACTIVE PROTEIN: CRP: 7 mg/dL — ABNORMAL HIGH (ref <1.0)

## 2020-06-01 MED ORDER — INSULIN GLARGINE 100 UNIT/ML ~~LOC~~ SOLN
28.0000 [IU] | Freq: Two times a day (BID) | SUBCUTANEOUS | Status: DC
  Administered 2020-06-01 – 2020-06-05 (×8): 28 [IU] via SUBCUTANEOUS
  Filled 2020-06-01 (×8): qty 0.28

## 2020-06-01 MED ORDER — NYSTATIN 100000 UNIT/ML MT SUSP
5.0000 mL | Freq: Four times a day (QID) | OROMUCOSAL | Status: DC
  Administered 2020-06-01 – 2020-06-11 (×43): 500000 [IU] via ORAL
  Filled 2020-06-01 (×43): qty 5

## 2020-06-01 MED ORDER — FLUCONAZOLE 150 MG PO TABS
150.0000 mg | ORAL_TABLET | Freq: Every day | ORAL | Status: AC
  Administered 2020-06-01 – 2020-06-02 (×3): 150 mg via ORAL
  Filled 2020-06-01 (×3): qty 1

## 2020-06-01 MED ORDER — INSULIN ASPART 100 UNIT/ML ~~LOC~~ SOLN
8.0000 [IU] | Freq: Three times a day (TID) | SUBCUTANEOUS | Status: DC
  Administered 2020-06-01 – 2020-06-02 (×2): 8 [IU] via SUBCUTANEOUS

## 2020-06-01 MED ORDER — SACCHAROMYCES BOULARDII 250 MG PO CAPS
250.0000 mg | ORAL_CAPSULE | Freq: Two times a day (BID) | ORAL | Status: DC
  Administered 2020-06-01 – 2020-06-11 (×22): 250 mg via ORAL
  Filled 2020-06-01 (×22): qty 1

## 2020-06-01 MED ORDER — SALINE SPRAY 0.65 % NA SOLN
1.0000 | NASAL | Status: DC | PRN
  Administered 2020-06-01: 1 via NASAL
  Filled 2020-06-01 (×2): qty 44

## 2020-06-01 MED ORDER — HEPARIN (PORCINE) 25000 UT/250ML-% IV SOLN
1600.0000 [IU]/h | INTRAVENOUS | Status: DC
  Administered 2020-06-01 – 2020-06-03 (×2): 1600 [IU]/h via INTRAVENOUS
  Filled 2020-06-01 (×3): qty 250

## 2020-06-01 NOTE — Progress Notes (Signed)
ANTICOAGULATION CONSULT NOTE - Follow Up Consult  Pharmacy Consult for Heparin Indication: DVT  No Known Allergies  Patient Measurements: Height: 5\' 5"  (165.1 cm) Weight: 131.7 kg (290 lb 5.5 oz) IBW/kg (Calculated) : 57 Heparin Dosing Weight: 92 kg  Vital Signs: Temp: 98.5 F (36.9 C) (12/08 0300) Temp Source: Oral (12/08 0300) BP: 146/84 (12/08 0300) Pulse Rate: 92 (12/08 0300)  Labs: Recent Labs    05/30/20 0237 05/30/20 0834 05/31/20 0557 05/31/20 0557 05/31/20 1719 05/31/20 1916 06/01/20 0310  HGB 12.7  --  12.2  --   --   --  12.3  HCT 38.9  --  37.5  --   --   --  37.7  PLT 530*  --  382  --   --   --  409*  HEPARINUNFRC  --    < > 0.22*   < > 3.26* 0.28* 0.30  CREATININE 0.65  --  0.53  --   --   --  0.59   < > = values in this interval not displayed.    Estimated Creatinine Clearance: 133.4 mL/min (by C-G formula based on SCr of 0.59 mg/dL).   Medications:  Infusions:  . ceFEPime (MAXIPIME) IV Stopped (06/01/20 0019)  . heparin 1,300 Units/hr (06/01/20 0300)    Assessment: Pt is a 34 year old female admitted with respiratory failure due to COVID-19 PNA. Previously on therapeutic LMWH for acute bilateral DVT, but pharmacy consulted on 12/5 to transition anticoagulation to heparin drip. Last dose of enoxaparin given @ 0625 on 12/5. -11/30: Venous doppler: + Bilateral DVT -12/5: CTA: negative for PE  Today, 06/01/2020   Heparin level 0.30, therapeutic at low end of range on heparin 1300 units/hr.    CBC: Hgb WNL/stable, WNL. Plt elevated  No bleeding or infusion related issues reported by 14/01/2020 RN.  Goal of Therapy:  Heparin level 0.3-0.7 units/ml Monitor platelets by anticoagulation protocol: Yes   Plan:  Increase heparin IV infusion to 1400 units/hr to maintain therapeutic range Heparin level 6 hours after rate change Daily heparin level and CBC Continue to monitor H&H and platelets  Lambert Mody PharmD, BCPS Clinical  Pharmacist [facility] main pharmacy [phone] 06/01/2020 3:47 AM

## 2020-06-01 NOTE — Progress Notes (Signed)
ANTICOAGULATION CONSULT NOTE  Pharmacy Consult for Heparin Indication: DVT  No Known Allergies  Patient Measurements: Height: 5\' 5"  (165.1 cm) Weight: 133.3 kg (293 lb 14 oz) IBW/kg (Calculated) : 57 Heparin Dosing Weight: 92 kg  Vital Signs: Temp: 98.3 F (36.8 C) (12/08 2000) Temp Source: Oral (12/08 2000) BP: 146/78 (12/08 1700) Pulse Rate: 112 (12/08 1700)  Labs: Recent Labs    05/30/20 0237 05/30/20 0834 05/31/20 0557 05/31/20 1719 06/01/20 0310 06/01/20 1136 06/01/20 2123  HGB 12.7  --  12.2  --  12.3  --   --   HCT 38.9  --  37.5  --  37.7  --   --   PLT 530*  --  382  --  409*  --   --   HEPARINUNFRC  --    < > 0.22*   < > 0.30 0.27* 0.42  CREATININE 0.65  --  0.53  --  0.59  --   --    < > = values in this interval not displayed.    Estimated Creatinine Clearance: 134.3 mL/min (by C-G formula based on SCr of 0.59 mg/dL).   Medications:  Infusions:  . ceFEPime (MAXIPIME) IV Stopped (06/01/20 1636)  . heparin 1,600 Units/hr (06/01/20 1800)    Assessment: Pt is a 33 year old female admitted with respiratory failure due to COVID-19 PNA. Previously on therapeutic LMWH for acute bilateral DVT, but pharmacy consulted on 12/5 to transition anticoagulation to heparin drip. Last dose of enoxaparin given @ 0625 on 12/5. -11/30: Venous doppler: + Bilateral DVT -12/5: CTA: negative for PE  Today, 06/01/2020   Heparin level 0.42, therapeutic on heparin 1600 units/hr.    CBC: Hgb WNL/stable, WNL. Plt elevated.    No bleeding or infusion related issues reported by RN.  Goal of Therapy:  Heparin level 0.3-0.7 units/ml Monitor platelets by anticoagulation protocol: Yes   Plan:  Continue heparin IV infusion at 1600 units/hr Heparin level 6 hours after rate change Daily heparin level and CBC  Continue to monitor H&H and platelets   14/01/2020 PharmD, BCPS Clinical Pharmacist [facility] main pharmacy [phone] 06/01/2020 10:19 PM

## 2020-06-01 NOTE — Progress Notes (Signed)
Pharmacy Antibiotic Note  Diane Padilla is a 34 y.o. female admitted on 05/20/2020 with COVID pneumonia. Antibiotics started on D7 of admission with new fevers and worsening opacities/atalectasis on CXR. Pharmacy has been consulted for cefepime dosing for bacterial PNA r/o.  Plan:  Cefepime 2 g IV q8 hr  Per Dr. Chestine Spore and Dr. David Stall, continue Cefepime for 7 day course.  Pharmacy will sign off notes, but follow up renal function, culture results, and clinical course peripherally.   Height: 5\' 5"  (165.1 cm) Weight: 133.3 kg (293 lb 14 oz) IBW/kg (Calculated) : 57  Temp (24hrs), Avg:98.6 F (37 C), Min:97.9 F (36.6 C), Max:99.7 F (37.6 C)  Recent Labs  Lab 05/28/20 0319 05/29/20 0310 05/29/20 2239 05/30/20 0237 05/31/20 0557 06/01/20 0310  WBC 25.3*  --  23.5* 24.2* 20.6* 20.8*  CREATININE 0.68 0.69  --  0.65 0.53 0.59    Estimated Creatinine Clearance: 134.3 mL/min (by C-G formula based on SCr of 0.59 mg/dL).    No Known Allergies  Antimicrobials this admission: 11/27 barcitinib >> (12/10)  11/28 remdesivir>> 12/2  12/3 vanc >> 12/4  12/3 cefepime  >> (12/10)  Dose adjustments this admission: n/a  Microbiology results: 12/3 BCx: NGTD 12/3 MRSA PCR: negative  Thank you for allowing pharmacy to be a part of this patient's care.  14/3 PharmD, BCPS Clinical Pharmacist [facility] main pharmacy [phone] 06/01/2020 1:20 PM

## 2020-06-01 NOTE — Progress Notes (Signed)
Occupational Therapy Treatment Patient Details Name: Diane Padilla MRN: [id number] DOB: 1985-09-05 Today's Date: 06/01/2020    History of present illness Diane Padilla is an 34 y.o. female past medical history of diabetes mellitus type 2 obstructive sleep apnea obesity who presents with shortness of breath was found to be COVID-19 positive   OT comments  Patient motivated to participate with therapy "I want to get my body back to normal." Patient participate in exercises listed below with min cues for technique with theraband and mod cues for PLB, relaxation techniques and slowing RR. Patient RR up to high 40s after sit to stands with decrease in O2 to 81-82%. Patient able to recover within ~1 min seated rest on 20L 50%FIO2. Educate patient on importance of IS use and performing exercises throughout the day to maximize activity tolerance necessary for participation in daily routine.   Follow Up Recommendations  Home health OT    Equipment Recommendations  Tub/shower seat       Precautions / Restrictions Precautions Precaution Comments: monitor o2 sats and RR, 50%, 20 L, PNRB       Mobility Bed Mobility               General bed mobility comments: in recliner  Transfers Overall transfer level: Needs assistance Equipment used: None Transfers: Sit to/from Stand Sit to Stand: Supervision         General transfer comment: performed STS x5 at supervision level                     Cognition Arousal/Alertness: Awake/alert Behavior During Therapy: WFL for tasks assessed/performed Overall Cognitive Status: Within Functional Limits for tasks assessed                                          Exercises Exercises: General Upper Extremity;Other exercises General Exercises - Upper Extremity Shoulder Flexion: Strengthening;Both;10 reps;Seated;Theraband Theraband Level (Shoulder Flexion): Level 4 (Blue) Shoulder Horizontal ABduction:  Strengthening;10 reps;Seated;Theraband Theraband Level (Shoulder Horizontal Abduction): Level 4 (Blue) Elbow Flexion: Strengthening;10 reps;Seated;Theraband Theraband Level (Elbow Flexion): Level 4 (Blue) Elbow Extension: Strengthening;10 reps;Seated;Theraband Theraband Level (Elbow Extension): Level 4 (Blue) General Exercises - Lower Extremity Ankle Circles/Pumps: AROM;Both;10 reps Long Arc Quad: AROM;Both;10 reps Hip Flexion/Marching: AROM;10 reps;Standing Other Exercises Other Exercises: sit to stand x5      General Comments patient desat to low 80s with sit to stands, education on PLB and slowing RR     Pertinent Vitals/ Pain       Pain Assessment: No/denies pain  Home Living Family/patient expects to be discharged to:: Private residence Living Arrangements: Spouse/significant other Available Help at Discharge: Family Type of Home: Apartment Home Access: Level entry     Home Layout: One level     Bathroom Shower/Tub: Chief Strategy Officer: Standard     Home Equipment: None          Prior Functioning/Environment Level of Independence: Independent        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    Frequency  Min 2X/week        Progress Toward Goals  OT Goals(current goals can now be found in the care plan section)  Progress towards OT goals: Progressing toward goals  Acute Rehab OT Goals Patient Stated Goal: to walk OT Goal Formulation: With patient Time For Goal Achievement: 06/12/20 Potential to Achieve Goals: Good ADL Goals  Pt Will Perform Lower Body Dressing: with modified independence Pt Will Transfer to Toilet: with modified independence Pt Will Perform Toileting - Clothing Manipulation and hygiene: with modified independence Additional ADL Goal #1: Patient will perform 10 min functional activity or exercise activity as evidence of improving activity tolerance Additional ADL Goal #2: Patient will demonstrate ability to use breathing techniques to  maintain needed oxygenation during daily tasks  Plan Discharge plan remains appropriate       AM-PAC OT "6 Clicks" Daily Activity     Outcome Measure   Help from another person eating meals?: None Help from another person taking care of personal grooming?: A Little Help from another person toileting, which includes using toliet, bedpan, or urinal?: A Little Help from another person bathing (including washing, rinsing, drying)?: A Little Help from another person to put on and taking off regular upper body clothing?: A Little Help from another person to put on and taking off regular lower body clothing?: A Little 6 Click Score: 19    End of Session Equipment Utilized During Treatment: Oxygen  OT Visit Diagnosis: Muscle weakness (generalized) (M62.81)   Activity Tolerance Patient tolerated treatment well   Patient Left in chair;with call bell/phone within reach   Nurse Communication Other (comment) (O2 sats with activity)        Time: 1333-1400 OT Time Calculation (min): 27 min  Charges: OT General Charges $OT Visit: 1 Visit OT Treatments $Therapeutic Exercise: 23-37 mins  Marlyce Huge OT OT pager: [phone]   Carmelia Roller 06/01/2020, 3:03 PM

## 2020-06-01 NOTE — Progress Notes (Signed)
PROGRESS NOTE  Diane Padilla  DGU:[id number] DOB: 1985/07/30 DOA: 05/20/2020 PCP: [facility]   Brief Narrative: Diane Padilla is a 34 y.o. female with a history of T2DM, OSA, PCOS, and obesity who presented with shortness of breath found to have covid-19 pneumonia requiring 6L supplemental oxygen which worsened to heated high flow requirement. She was admitted to the ICU/SDU and started on steroids. After initially declining other therapies, remdesivir and baricitinib were started after continued discussions and worsening clinical status. Lower extremity venous U/S revealed bilateral acute DVTs for which therapeutic dose anticoagulation was started on 11/30. Hypoxia has remained severe. Antibiotics empirically added 12/3.  Assessment & Plan: Active Problems:   Type 2 diabetes mellitus with hyperglycemia, without long-term current use of insulin (HCC)   Class 3 severe obesity with serious comorbidity and body mass index (BMI) of 50.0 to 59.9 in adult [facility])   Pneumonia due to COVID-19 virus   Acute hypoxemic respiratory failure due to COVID-19 [facility])   Hyponatremia   Acute respiratory failure with hypoxia (HCC)   Acute deep vein thrombosis (DVT) of distal vein of lower extremity (HCC)  Acute hypoxemic respiratory failure due to covid-19 pneumonia with concern for superimposed bacterial pneumonia: SARS-CoV-2 PCR positive on 11/26.  - Continue to urge use of IS and mobilize if possible. Per PCCM, ok to use NIPPV qHS and prn. - S/p remdesivir x5 days (11/28 - 12/2) - Continue steroids. Inflammatory markers persistently elevated. Do not want to increase steroids due to hyperglycemia. - Continue baricitinib (11/27 >> ) x14 days or until DC. - With resurgence of CRP, new fever, use of immunomodulator, concern for bacterial pneumonia on 12/3, started vanc/cefepime due to hospital acquisition. - Encourage OOB, IS, FV, and awake proning if able - Continue  airborne, contact precautions for 21 days from positive testing. - Monitor CMP and inflammatory markers in AM  ALT elevation: Likely due to covid-19  - Monitor intermittently.  Acute bilateral peroneal vein DVTs:  - Continue anticoagulation now with heparin.  T2DM with steroid-induced hyperglycemia:  - Continue insulins, increase mealtime and basal today. No hypoglycemia noted. - Linagliptin, continue  Thrush:  - Start po nystatin. QID  Morbid obesity: Estimated body mass index is 48.9 kg/m as calculated from the following:   Height as of this encounter: 5\' 5"  (1.651 m).   Weight as of this encounter: 133.3 kg.  DVT prophylaxis: IV heparin Code Status: Full Family Communication: Husband and mother by phone daily.  Disposition Plan:  Status is: Inpatient; remain in ICU.    Remains inpatient appropriate because:Inpatient level of care appropriate due to severity of illness  Dispo: The patient is from: Home              Anticipated d/c is to: Home              Anticipated d/c date is: > 3 days              Patient currently is not medically stable to d/c.  Consultants:   PCCM  Procedures:   None  Antimicrobials:  Remdesivir 11/28 - 12/2   Vancomycin 12/3  Cefepime 12/3 - 12/9  Subjective: Shortness of breath is constant, severe, unchanged from prior associated with cough but no chest pain or bleeding.   Objective: Vitals:   06/01/20 0700 06/01/20 0800 06/01/20 0828 06/01/20 0900  BP: (!) 156/75   (!) 149/83  Pulse: (!) 101   (!) 110  Resp: 14/08/21)  33   (!) 39  Temp:  98.2 F (36.8 C)    TempSrc:  Oral    SpO2: 93%  94% 93%  Weight:      Height:        Intake/Output Summary (Last 24 hours) at 06/01/2020 1159 Last data filed at 06/01/2020 0800 Gross per 24 hour  Intake 1945.21 ml  Output 2350 ml  Net -404.79 ml   Filed Weights   05/20/20 2118 05/31/20 1500 06/01/20 0503  Weight: (!) 139.2 kg 131.7 kg 133.3 kg   Gen: 34 y.o. female in no  distress HEENT: Tongue with irritation and leukoplakia Pulm: Tachypneic with HHF + NRB, crackles, diminished.  CV: Regular tachycardia without murmur, rub, or gallop. No JVD, no dependent edema. GI: Abdomen soft, non-tender, non-distended, with normoactive bowel sounds.  Ext: Warm, no deformities Skin: No rashes, lesions or ulcers on visualized skin. Neuro: Alert and oriented. No focal neurological deficits. Psych: Judgement and insight appear fair. Mood euthymic & affect congruent. Behavior is appropriate.    Data Reviewed: I have personally reviewed following labs and imaging studies  CBC: Recent Labs  Lab 05/27/20 1505 05/27/20 1505 05/28/20 0319 05/29/20 2239 05/30/20 0237 05/31/20 0557 06/01/20 0310  WBC 24.8*   < > 25.3* 23.5* 24.2* 20.6* 20.8*  NEUTROABS 21.6*  --   --   --  20.7* 16.4* 17.6*  HGB 14.1   < > 13.7 12.5 12.7 12.2 12.3  HCT 42.6   < > 41.8 39.1 38.9 37.5 37.7  MCV 82.4   < > 82.9 83.7 83.1 83.0 82.5  PLT 441*   < > 447* 497* 530* 382 409*   < > = values in this interval not displayed.   Basic Metabolic Panel: Recent Labs  Lab 05/28/20 0319 05/29/20 0310 05/30/20 0237 05/31/20 0557 06/01/20 0310  NA 133* 134* 134* 132* 132*  K 4.7 5.1 4.6 4.0 4.6  CL 101 101 101 96* 97*  CO2 21* 22 23 25 24   GLUCOSE 201* 258* 230* 137* 163*  BUN 22* 24* 22* 17 14  CREATININE 0.68 0.69 0.65 0.53 0.59  CALCIUM 8.8* 8.9 9.1 8.8* 9.0   GFR: Estimated Creatinine Clearance: 134.3 mL/min (by C-G formula based on SCr of 0.59 mg/dL). Liver Function Tests: Recent Labs  Lab 05/26/20 0319 05/28/20 0319 05/29/20 0310  AST 38 59* 54*  ALT 77* 155* 172*  ALKPHOS 89 100 103  BILITOT 0.7 0.8 0.5  PROT 7.4 8.1 7.5  ALBUMIN 2.9* 3.1* 3.0*   CBG: Recent Labs  Lab 05/31/20 1141 05/31/20 1644 05/31/20 2014 05/31/20 2348 06/01/20 0346  GLUCAP 257* 189* 180* 228* 135*   Anemia Panel: No results for input(s): VITAMINB12, FOLATE, FERRITIN, TIBC, IRON, RETICCTPCT in  the last 72 hours. Urine analysis:    Component Value Date/Time   BILIRUBINUR negative 08/19/2018 1543   PROTEINUR Negative 08/19/2018 1543   UROBILINOGEN 0.2 08/19/2018 1543   NITRITE negative 08/19/2018 1543   LEUKOCYTESUR Moderate (2+) (A) 08/19/2018 1543   Recent Results (from the past 240 hour(s))  Culture, blood (routine x 2)     Status: None   Collection Time: 05/27/20  3:05 PM   Specimen: BLOOD  Result Value Ref Range Status   Specimen Description   Final    BLOOD LEFT ANTECUBITAL Performed at [facility], [address] [place], [place]    Special Requests   Final    BOTTLES DRAWN AEROBIC ONLY Blood Culture adequate volume Performed at  [facility], [address] [place], [place] [address]    Culture   Final    NO GROWTH 5 DAYS Performed at [facility], [address], [place], [place] [address]    Report Status 06/01/2020 FINAL  Final  Culture, blood (routine x 2)     Status: None   Collection Time: 05/27/20  3:05 PM   Specimen: BLOOD LEFT HAND  Result Value Ref Range Status   Specimen Description   Final    BLOOD LEFT HAND Performed at [facility], [address], [place], [place] [address]    Special Requests   Final    BOTTLES DRAWN AEROBIC ONLY Blood Culture adequate volume Performed at [facility], [address], [place], [place] [address]    Culture   Final    NO GROWTH 5 DAYS Performed at [facility], [address], [place], [place] [address]    Report Status 06/01/2020 FINAL  Final  MRSA PCR Screening     Status: None   Collection Time: 05/27/20  6:00 PM   Specimen: Nasal Mucosa; Nasopharyngeal  Result Value Ref Range Status   MRSA by PCR NEGATIVE NEGATIVE Final    Comment:        The GeneXpert MRSA Assay (FDA approved for NASAL specimens only), is one component of a comprehensive MRSA colonization surveillance program. It is  not intended to diagnose MRSA infection nor to guide or monitor treatment for MRSA infections. Performed at [facility], [address], [place], [place] [address]       Radiology Studies: No results found.  Scheduled Meds: . (feeding supplement) PROSource Plus  30 mL Oral TID BM  . albuterol  2 puff Inhalation QID  . amLODipine  5 mg Oral Daily  . baricitinib  4 mg Oral Daily  . Chlorhexidine Gluconate Cloth  6 each Topical Q0600  . feeding supplement (NEPRO CARB STEADY)  237 mL Oral TID BM  . fluconazole  150 mg Oral QHS  . hydrALAZINE  50 mg Oral Q8H  . insulin aspart  0-20 Units Subcutaneous TID WC  . insulin aspart  0-5 Units Subcutaneous QHS  . insulin aspart  6 Units Subcutaneous TID WC  . insulin glargine  25 Units Subcutaneous BID  . linagliptin  5 mg Oral Daily  . mouth rinse  15 mL Mouth Rinse BID  . melatonin  5 mg Oral QHS  . methylPREDNISolone (SOLU-MEDROL) injection  10 mg Intravenous Q12H  . metoprolol tartrate  75 mg Oral BID  . multivitamin with minerals  1 tablet Oral Daily  . nystatin  5 mL Oral QID  . saccharomyces boulardii  250 mg Oral BID  . sodium chloride flush  10-40 mL Intracatheter Q12H   Continuous Infusions: . ceFEPime (MAXIPIME) IV Stopped (06/01/20 0853)  . heparin 1,400 Units/hr (06/01/20 0428)     LOS: 12 days   Time spent: 35 minutes.  Tyrone Nine, MD [facility] [email or web address] 06/01/2020, 11:59 AM

## 2020-06-01 NOTE — Progress Notes (Signed)
ANTICOAGULATION CONSULT NOTE - Follow Up Consult  Pharmacy Consult for Heparin Indication: DVT  No Known Allergies  Patient Measurements: Height: 5\' 5"  (165.1 cm) Weight: 133.3 kg (293 lb 14 oz) IBW/kg (Calculated) : 57 Heparin Dosing Weight: 92 kg  Vital Signs: Temp: 98.1 F (36.7 C) (12/08 0300) Temp Source: Oral (12/08 0300) BP: 156/75 (12/08 0700) Pulse Rate: 101 (12/08 0700)  Labs: Recent Labs    05/30/20 0237 05/30/20 0834 05/31/20 0557 05/31/20 0557 05/31/20 1719 05/31/20 1916 06/01/20 0310  HGB 12.7  --  12.2  --   --   --  12.3  HCT 38.9  --  37.5  --   --   --  37.7  PLT 530*  --  382  --   --   --  409*  HEPARINUNFRC  --    < > 0.22*   < > 3.26* 0.28* 0.30  CREATININE 0.65  --  0.53  --   --   --  0.59   < > = values in this interval not displayed.    Estimated Creatinine Clearance: 134.3 mL/min (by C-G formula based on SCr of 0.59 mg/dL).   Medications:  Infusions:  . ceFEPime (MAXIPIME) IV Stopped (06/01/20 0019)  . heparin 1,400 Units/hr (06/01/20 0428)    Assessment: Pt is a 34 year old female admitted with respiratory failure due to COVID-19 PNA. Previously on therapeutic LMWH for acute bilateral DVT, but pharmacy consulted on 12/5 to transition anticoagulation to heparin drip. Last dose of enoxaparin given @ 0625 on 12/5. -11/30: Venous doppler: + Bilateral DVT -12/5: CTA: negative for PE  Today, 06/01/2020   Heparin level decreased to 0.27, sub-therapeutic on heparin 1400 units/hr.    CBC: Hgb WNL/stable, WNL. Plt elevated.    No bleeding or infusion related issues reported by RN.  Goal of Therapy:  Heparin level 0.3-0.7 units/ml Monitor platelets by anticoagulation protocol: Yes   Plan:  Increase to heparin IV infusion at 1600 units/hr Heparin level 6 hours after rate change Daily heparin level and CBC Continue to monitor H&H and platelets   14/01/2020 PharmD, BCPS Clinical Pharmacist [facility] main pharmacy [phone] 06/01/2020  7:19 AM

## 2020-06-02 LAB — D-DIMER, QUANTITATIVE: D-Dimer, Quant: 3.65 ug/mL-FEU — ABNORMAL HIGH (ref 0.00–0.50)

## 2020-06-02 LAB — C-REACTIVE PROTEIN: CRP: 5.9 mg/dL — ABNORMAL HIGH (ref <1.0)

## 2020-06-02 LAB — GLUCOSE, CAPILLARY
Glucose-Capillary: 155 mg/dL — ABNORMAL HIGH (ref 70–99)
Glucose-Capillary: 228 mg/dL — ABNORMAL HIGH (ref 70–99)
Glucose-Capillary: 161 mg/dL — ABNORMAL HIGH (ref 70–99)

## 2020-06-02 LAB — HEPARIN LEVEL (UNFRACTIONATED): Heparin Unfractionated: 0.51 IU/mL (ref 0.30–0.70)

## 2020-06-02 MED ORDER — INSULIN ASPART 100 UNIT/ML ~~LOC~~ SOLN
10.0000 [IU] | Freq: Three times a day (TID) | SUBCUTANEOUS | Status: DC
  Administered 2020-06-02 – 2020-06-06 (×12): 10 [IU] via SUBCUTANEOUS

## 2020-06-02 NOTE — Progress Notes (Signed)
Physical Therapy Treatment Patient Details Name: Diane Padilla MRN: [id number] DOB: [birth year] Today's Date: 06/02/2020    History of Present Illness Diane Padilla is an 34 y.o. female past medical history of diabetes mellitus type 2 obstructive sleep apnea obesity who presents with shortness of breath was found to be COVID-19 positive    PT Comments    The patient  Ambulated in room , 20' then 40'. Patient on 8 L HFNC and PNRB mask. SPO2 remained >87%, HR max 100. RR max 34. Patient dyspnea 3/4 after second walk. Patient making progress in mobility. Will attempt to mobilize more in room  And decrease O2 as tolerated while monitoring SPO2..   Follow Up Recommendations  Home health PT     Equipment Recommendations  None recommended by PT    Recommendations for Other Services       Precautions / Restrictions Precautions Precaution Comments: monitor o2 sats and RR, 8 L HFNC, 15PNRB    Mobility  Bed Mobility               General bed mobility comments: in recliner  Transfers Overall transfer level: Needs assistance Equipment used: Rolling walker (2 wheeled) Transfers: Sit to/from Stand Sit to Stand: Supervision Stand pivot transfers: Supervision       General transfer comment: stands from recliner and [facility] without assistance other than armrests. pivots to [facility] and back with supervision, asssitance with lines  Ambulation/Gait Ambulation/Gait assistance: Min assist Gait Distance (Feet): 20 Feet (then 40) Assistive device: Rolling walker (2 wheeled) Gait Pattern/deviations: Step-through pattern Gait velocity: decreased   General Gait Details: mild  balance loss, knees slightly buckled x 1   Stairs             Wheelchair Mobility    Modified Rankin (Stroke Patients Only)       Balance Overall balance assessment: Mild deficits observed, not formally tested                                          Cognition Arousal/Alertness:  Awake/alert Behavior During Therapy: WFL for tasks assessed/performed                                          Exercises      General Comments        Pertinent Vitals/Pain Pain Assessment: No/denies pain    Home Living                      Prior Function            PT Goals (current goals can now be found in the care plan section) Progress towards PT goals: Progressing toward goals    Frequency    Min 3X/week      PT Plan Current plan remains appropriate    Co-evaluation              AM-PAC PT "6 Clicks" Mobility   Outcome Measure  Help needed turning from your back to your side while in a flat bed without using bedrails?: A Little Help needed moving from lying on your back to sitting on the side of a flat bed without using bedrails?: A Little Help needed moving to and from a bed to a chair (including  a wheelchair)?: A Little Help needed standing up from a chair using your arms (e.g., wheelchair or bedside chair)?: A Little Help needed to walk in hospital room?: A Little Help needed climbing 3-5 steps with a railing? : A Lot 6 Click Score: 17    End of Session Equipment Utilized During Treatment: Oxygen Activity Tolerance: Patient tolerated treatment well Patient left: in chair;with call bell/phone within reach Nurse Communication: Mobility status PT Visit Diagnosis: Difficulty in walking, not elsewhere classified (R26.2)     Time: 4650-3546 PT Time Calculation (min) (ACUTE ONLY): 30 min  Charges:  $Gait Training: 23-37 mins                     Blanchard Kelch PT Acute Rehabilitation Services Pager [phone] Office [phone]    Rada Hay 06/02/2020, 1:33 PM

## 2020-06-02 NOTE — Progress Notes (Signed)
Discussed case with Dr. Jarvis Newcomer, patient remains stable. PCCM will be available as needed. Please call back if new needs arise.     Canary Brim, MSN, NP-C, AGACNP-BC [facility] 06/02/2020, 8:36 AM   Please see [email or web address] for pager details.

## 2020-06-02 NOTE — Progress Notes (Signed)
ANTICOAGULATION CONSULT NOTE  Pharmacy Consult for Heparin Indication: DVT  No Known Allergies  Patient Measurements: Height: 5\' 5"  (165.1 cm) Weight: 133.3 kg (293 lb 14 oz) IBW/kg (Calculated) : 57 Heparin Dosing Weight: 92 kg  Vital Signs: Temp: 99 F (37.2 C) (12/09 0300) Temp Source: Axillary (12/09 0300) BP: 150/52 (12/09 0900) Pulse Rate: 111 (12/09 0800)  Labs: Recent Labs    05/31/20 0557 05/31/20 1719 06/01/20 0310 06/01/20 1136 06/01/20 2123 06/02/20 0301  HGB 12.2  --  12.3  --   --   --   HCT 37.5  --  37.7  --   --   --   PLT 382  --  409*  --   --   --   HEPARINUNFRC 0.22*   < > 0.30 0.27* 0.42 0.51  CREATININE 0.53  --  0.59  --   --   --    < > = values in this interval not displayed.    Estimated Creatinine Clearance: 134.3 mL/min (by C-G formula based on SCr of 0.59 mg/dL).   Medications:  Infusions:  . ceFEPime (MAXIPIME) IV Stopped (06/02/20 0900)  . heparin 1,600 Units/hr (06/02/20 0400)    Assessment: Pt is a 34 year old female admitted with respiratory failure due to COVID-19 PNA. Previously on therapeutic LMWH for acute bilateral DVT, but pharmacy consulted on 12/5 to transition anticoagulation to heparin drip. Last dose of enoxaparin given @ 0625 on 12/5. -11/30: Venous doppler: + Bilateral DVT -12/5: CTA: negative for PE  Today, 06/02/2020   Heparin level 0.51, therapeutic on heparin 1600 units/hr.    No bleeding or infusion related issues reported by RN.  Goal of Therapy:  Heparin level 0.3-0.7 units/ml Monitor platelets by anticoagulation protocol: Yes   Plan:  Continue heparin IV infusion at 1600 units/hr Daily heparin level and CBC  Continue to monitor H&H and platelets  14/02/2020, PharmD, BCPS Pharmacy [phone] 06/02/2020 11:54 AM

## 2020-06-02 NOTE — Progress Notes (Signed)
PROGRESS NOTE  MELAH EBLING  GGE:[id number] DOB: 12/29/1985 DOA: 05/20/2020 PCP: [facility]   Brief Narrative: JENIN BIRDSALL is a 34 y.o. female with a history of T2DM, OSA, PCOS, and obesity who presented with shortness of breath found to have covid-19 pneumonia requiring 6L supplemental oxygen which worsened to heated high flow requirement. She was admitted to the ICU/SDU and started on steroids. After initially declining other therapies, remdesivir and baricitinib were started after continued discussions and worsening clinical status. Lower extremity venous U/S revealed bilateral acute DVTs for which therapeutic dose anticoagulation was started on 11/30. Hypoxia has remained severe. Antibiotics empirically added 12/3 and course completed. As of 12/8, hypoxia has improved no longer requiring heated high flow oxygen.  Assessment & Plan: Active Problems:   Type 2 diabetes mellitus with hyperglycemia, without long-term current use of insulin (HCC)   Class 3 severe obesity with serious comorbidity and body mass index (BMI) of 50.0 to 59.9 in adult [facility])   Pneumonia due to COVID-19 virus   Acute hypoxemic respiratory failure due to COVID-19 [facility])   Hyponatremia   Acute respiratory failure with hypoxia (HCC)   Acute deep vein thrombosis (DVT) of distal vein of lower extremity (HCC)  Acute hypoxemic respiratory failure due to covid-19 pneumonia with concern for superimposed bacterial pneumonia:  - Patient provided proof of SARS-CoV-2 PCR positive initially on 05/14/2020. Therefore, isolation will continue for 21 days through 12/11 with airborne/contact precautions discontinuing on 12/12.  - Continue to urge use of IS and mobilize if possible. Per PCCM, ok to use NIPPV qHS and prn. D/w PCCM, will contact them with any further questions.  - S/p remdesivir x5 days (11/28 - 12/2) - Continue steroids. Inflammatory markers persistently elevated. Do not want to  increase steroids due to hyperglycemia. - Continue baricitinib (11/27 - 12/10 )  - Completed 7 days abx for possible HCAP on 12/9.  - Encourage OOB, IS, FV, and awake proning if able - Monitor CMP and inflammatory markers in AM  ALT elevation: Likely due to covid-19  - Monitor in AM  Acute bilateral peroneal vein DVTs:  - Continue anticoagulation now with heparin.  T2DM with steroid-induced hyperglycemia:  - Continue insulins, increased mealtime and basal 12/8, will further increase mealtime today. No hypoglycemia noted. - Linagliptin, continue  Thrush:  - Start po nystatin. QID  Morbid obesity: Estimated body mass index is 48.9 kg/m as calculated from the following:   Height as of this encounter: 5\' 5"  (1.651 m).   Weight as of this encounter: 133.3 kg.  DVT prophylaxis: IV heparin Code Status: Full Family Communication: Husband and mother by phone daily.  Disposition Plan:  Status is: Inpatient; if stable on 15L x24 hours, may transfer to PCU.  Remains inpatient appropriate because:Inpatient level of care appropriate due to severity of illness  Dispo: The patient is from: Home              Anticipated d/c is to: Home              Anticipated d/c date is: > 3 days              Patient currently is not medically stable to d/c.  Consultants:   PCCM  Procedures:   None  Antimicrobials:  Remdesivir 11/28 - 12/2   Vancomycin 12/3  Cefepime 12/3 - 12/10  Subjective: Feels better today than ever before during hospitalization. Up to chair, still some cough and dyspnea worse  with exertion but this is gradually improving. No chest pain. +BM today. Eating ok.   Objective: Vitals:   06/02/20 0200 06/02/20 0300 06/02/20 0400 06/02/20 0500  BP:      Pulse: 88 88 88 91  Resp: (!) 32 (!) 41 (!) 26 (!) 42  Temp:  99 F (37.2 C)    TempSrc:  Axillary    SpO2: 98% 98% 99% 97%  Weight:      Height:        Intake/Output Summary (Last 24 hours) at 06/02/2020 1011 Last  data filed at 06/02/2020 0400 Gross per 24 hour  Intake 716.81 ml  Output --  Net 716.81 ml   Filed Weights   05/20/20 2118 05/31/20 1500 06/01/20 0503  Weight: (!) 139.2 kg 131.7 kg 133.3 kg   Gen: 34 y.o. female in no distress Pulm: Nonlabored breathing 15L HFNC, diminished. CV: Regular rate and rhythm. No murmur, rub, or gallop. No JVD, no pitting dependent edema. GI: Abdomen soft, non-tender, non-distended, with normoactive bowel sounds.  Ext: Warm, no deformities Skin: No rashes, lesions or ulcers on visualized skin. Neuro: Alert and oriented. No focal neurological deficits. Psych: Judgement and insight appear fair. Mood euthymic & affect congruent. Behavior is appropriate.    Data Reviewed: I have personally reviewed following labs and imaging studies  CBC: Recent Labs  Lab 05/27/20 1505 05/28/20 0319 05/29/20 2239 05/30/20 0237 05/31/20 0557 06/01/20 0310  WBC 24.8* 25.3* 23.5* 24.2* 20.6* 20.8*  NEUTROABS 21.6*  --   --  20.7* 16.4* 17.6*  HGB 14.1 13.7 12.5 12.7 12.2 12.3  HCT 42.6 41.8 39.1 38.9 37.5 37.7  MCV 82.4 82.9 83.7 83.1 83.0 82.5  PLT 441* 447* 497* 530* 382 409*   Basic Metabolic Panel: Recent Labs  Lab 05/28/20 0319 05/29/20 0310 05/30/20 0237 05/31/20 0557 06/01/20 0310  NA 133* 134* 134* 132* 132*  K 4.7 5.1 4.6 4.0 4.6  CL 101 101 101 96* 97*  CO2 21* 22 23 25 24   GLUCOSE 201* 258* 230* 137* 163*  BUN 22* 24* 22* 17 14  CREATININE 0.68 0.69 0.65 0.53 0.59  CALCIUM 8.8* 8.9 9.1 8.8* 9.0   GFR: Estimated Creatinine Clearance: 134.3 mL/min (by C-G formula based on SCr of 0.59 mg/dL). Liver Function Tests: Recent Labs  Lab 05/28/20 0319 05/29/20 0310  AST 59* 54*  ALT 155* 172*  ALKPHOS 100 103  BILITOT 0.8 0.5  PROT 8.1 7.5  ALBUMIN 3.1* 3.0*   CBG: Recent Labs  Lab 05/31/20 2348 06/01/20 0346 06/01/20 1522 06/01/20 2346 06/02/20 0356  GLUCAP 228* 135* 249* 228* 155*   Anemia Panel: No results for input(s):  VITAMINB12, FOLATE, FERRITIN, TIBC, IRON, RETICCTPCT in the last 72 hours. Urine analysis:    Component Value Date/Time   BILIRUBINUR negative 08/19/2018 1543   PROTEINUR Negative 08/19/2018 1543   UROBILINOGEN 0.2 08/19/2018 1543   NITRITE negative 08/19/2018 1543   LEUKOCYTESUR Moderate (2+) (A) 08/19/2018 1543   Recent Results (from the past 240 hour(s))  Culture, blood (routine x 2)     Status: None   Collection Time: 05/27/20  3:05 PM   Specimen: BLOOD  Result Value Ref Range Status   Specimen Description   Final    BLOOD LEFT ANTECUBITAL Performed at [facility], [address] [place], [place]    Special Requests   Final    BOTTLES DRAWN AEROBIC ONLY Blood Culture adequate volume Performed at [facility], [address], [place], [place] [address]    Culture   Final    NO GROWTH 5 DAYS Performed at [facility], [address], [place], [place] [address]    Report Status 06/01/2020 FINAL  Final  Culture, blood (routine x 2)     Status: None   Collection Time: 05/27/20  3:05 PM   Specimen: BLOOD LEFT HAND  Result Value Ref Range Status   Specimen Description   Final    BLOOD LEFT HAND Performed at [facility], [address], [place], [place] [address]    Special Requests   Final    BOTTLES DRAWN AEROBIC ONLY Blood Culture adequate volume Performed at [facility], [address], [place], [place] [address]    Culture   Final    NO GROWTH 5 DAYS Performed at [facility], [address], [place], [place] [address]    Report Status 06/01/2020 FINAL  Final  MRSA PCR Screening     Status: None   Collection Time: 05/27/20  6:00 PM   Specimen: Nasal Mucosa; Nasopharyngeal  Result Value Ref Range Status   MRSA by PCR NEGATIVE NEGATIVE Final    Comment:        The GeneXpert MRSA Assay (FDA approved for NASAL specimens only), is one component of a comprehensive  MRSA colonization surveillance program. It is not intended to diagnose MRSA infection nor to guide or monitor treatment for MRSA infections. Performed at [facility], [address] [place], [place] [address]       Radiology Studies: No results found.  Scheduled Meds: . (feeding supplement) PROSource Plus  30 mL Oral TID BM  . albuterol  2 puff Inhalation QID  . amLODipine  5 mg Oral Daily  . baricitinib  4 mg Oral Daily  . Chlorhexidine Gluconate Cloth  6 each Topical Q0600  . feeding supplement (NEPRO CARB STEADY)  237 mL Oral TID BM  . fluconazole  150 mg Oral QHS  . hydrALAZINE  50 mg Oral Q8H  . insulin aspart  0-20 Units Subcutaneous TID WC  . insulin aspart  0-5 Units Subcutaneous QHS  . insulin aspart  8 Units Subcutaneous TID WC  . insulin glargine  28 Units Subcutaneous BID  . linagliptin  5 mg Oral Daily  . mouth rinse  15 mL Mouth Rinse BID  . melatonin  5 mg Oral QHS  . methylPREDNISolone (SOLU-MEDROL) injection  10 mg Intravenous Q12H  . metoprolol tartrate  75 mg Oral BID  . multivitamin with minerals  1 tablet Oral Daily  . nystatin  5 mL Oral QID  . saccharomyces boulardii  250 mg Oral BID  . sodium chloride flush  10-40 mL Intracatheter Q12H   Continuous Infusions: . ceFEPime (MAXIPIME) IV Stopped (06/02/20 0900)  . heparin 1,600 Units/hr (06/02/20 0400)     LOS: 13 days   Time spent: 35 minutes.  Tyrone Nine, MD [facility] [email or web address] 06/02/2020, 10:11 AM

## 2020-06-03 LAB — GLUCOSE, CAPILLARY
Glucose-Capillary: 113 mg/dL — ABNORMAL HIGH (ref 70–99)
Glucose-Capillary: 133 mg/dL — ABNORMAL HIGH (ref 70–99)
Glucose-Capillary: 142 mg/dL — ABNORMAL HIGH (ref 70–99)
Glucose-Capillary: 159 mg/dL — ABNORMAL HIGH (ref 70–99)
Glucose-Capillary: 177 mg/dL — ABNORMAL HIGH (ref 70–99)
Glucose-Capillary: 181 mg/dL — ABNORMAL HIGH (ref 70–99)
Glucose-Capillary: 193 mg/dL — ABNORMAL HIGH (ref 70–99)
Glucose-Capillary: 199 mg/dL — ABNORMAL HIGH (ref 70–99)
Glucose-Capillary: 201 mg/dL — ABNORMAL HIGH (ref 70–99)
Glucose-Capillary: 208 mg/dL — ABNORMAL HIGH (ref 70–99)
Glucose-Capillary: 248 mg/dL — ABNORMAL HIGH (ref 70–99)
Glucose-Capillary: 281 mg/dL — ABNORMAL HIGH (ref 70–99)

## 2020-06-03 LAB — CBC
HCT: 35.7 % — ABNORMAL LOW (ref 36.0–46.0)
Hemoglobin: 11.6 g/dL — ABNORMAL LOW (ref 12.0–15.0)
MCH: 27.1 pg (ref 26.0–34.0)
MCHC: 32.5 g/dL (ref 30.0–36.0)
MCV: 83.4 fL (ref 80.0–100.0)
Platelets: 423 10*3/uL — ABNORMAL HIGH (ref 150–400)
RBC: 4.28 MIL/uL (ref 3.87–5.11)
RDW: 14.3 % (ref 11.5–15.5)
WBC: 21.5 10*3/uL — ABNORMAL HIGH (ref 4.0–10.5)
nRBC: 0 % (ref 0.0–0.2)

## 2020-06-03 LAB — HEPARIN LEVEL (UNFRACTIONATED): Heparin Unfractionated: 0.61 IU/mL (ref 0.30–0.70)

## 2020-06-03 MED ORDER — METHYLPREDNISOLONE SODIUM SUCC 40 MG IJ SOLR
10.0000 mg | Freq: Every day | INTRAMUSCULAR | Status: AC
  Administered 2020-06-04 – 2020-06-06 (×3): 10 mg via INTRAVENOUS
  Filled 2020-06-03 (×3): qty 1

## 2020-06-03 MED ORDER — ENOXAPARIN SODIUM 150 MG/ML ~~LOC~~ SOLN
130.0000 mg | Freq: Two times a day (BID) | SUBCUTANEOUS | Status: DC
  Administered 2020-06-03 – 2020-06-11 (×18): 130 mg via SUBCUTANEOUS
  Filled 2020-06-03 (×18): qty 0.86

## 2020-06-03 NOTE — Progress Notes (Signed)
RN paged [facility] about patient's consistent heart rate in the 120's. MD not surprised about heart rate, and does not deem it an issue unless patient's blood pressure starts to drop and/or shows signs of sepsis.

## 2020-06-03 NOTE — Progress Notes (Signed)
Physical Therapy Treatment Patient Details Name: Diane Padilla MRN: [id number] DOB: 1986/06/23 Today's Date: 06/03/2020    History of Present Illness Diane Padilla is an 34 y.o. female past medical history of diabetes mellitus type 2 obstructive sleep apnea obesity who presents with shortness of breath was found to be COVID-19 positive    PT Comments    Patient resting in recliner on 9-10 L HFNC, SPO2 95%(finger sensor). Patient ambulated in room x 20' then ~ 80'. Patient  On 10 L HFNC, noted ? Drop to high 70's after amb to Br and toileting. Initiated NRB at 10 l for recovery to 90's  For ~ 1 minute.Patient ambulated x 80' using RW  On 10 L HFNC, SPO2 in 80% range. Placed sensor on ear with an increased reading in 90's. Patient left on 9 L resting at 95%.Encouraged PLB, tends to breathe through mouth No Known Allergies in 30's with activity.  Continue Progressive ambulation.  encouraged patient to ask nursing to ambulate to BR when possible..   Follow Up Recommendations  Home health PT     Equipment Recommendations  None recommended by PT    Recommendations for Other Services       Precautions / Restrictions Precautions Precaution Comments: monitor o2 sats and RR, 10 L HFNC, desatted so added PNRB briefly    Mobility  Bed Mobility               General bed mobility comments: in recliner  Transfers Overall transfer level: Needs assistance Equipment used: Rolling walker (2 wheeled) Transfers: Sit to/from Stand Sit to Stand: Supervision         General transfer comment: stands from recliner and toilet, used armrests and rail.  Ambulation/Gait Ambulation/Gait assistance: Min guard Gait Distance (Feet): 20 Feet (then 80') Assistive device: Rolling walker (2 wheeled) Gait Pattern/deviations: Step-through pattern Gait velocity: decreased   General Gait Details: slow but steady balance   Stairs             Wheelchair Mobility    Modified Rankin  (Stroke Patients Only)       Balance Overall balance assessment: Mild deficits observed, not formally tested                                          Cognition Arousal/Alertness: Awake/alert Behavior During Therapy: WFL for tasks assessed/performed Overall Cognitive Status: Within Functional Limits for tasks assessed                                 General Comments: very motivated, pleasant      Exercises      General Comments General comments (skin integrity, edema, etc.): trial patient on 10L without PNRB with ambulation O2 would fluctuate from 70s to 80s therefore donned to recover. at end of session patient on 9L HFNC with saturations in 90s at rest      Pertinent Vitals/Pain Pain Assessment: No/denies pain    Home Living                      Prior Function            PT Goals (current goals can now be found in the care plan section) Acute Rehab PT Goals Patient Stated Goal: to walk Progress towards PT goals: Progressing toward  goals    Frequency    Min 3X/week      PT Plan Current plan remains appropriate    Co-evaluation   Reason for Co-Treatment: To address functional/ADL transfers   OT goals addressed during session: ADL's and self-care      AM-PAC PT "6 Clicks" Mobility   Outcome Measure  Help needed turning from your back to your side while in a flat bed without using bedrails?: None Help needed moving from lying on your back to sitting on the side of a flat bed without using bedrails?: None Help needed moving to and from a bed to a chair (including a wheelchair)?: A Little Help needed standing up from a chair using your arms (e.g., wheelchair or bedside chair)?: A Little Help needed to walk in hospital room?: A Little Help needed climbing 3-5 steps with a railing? : A Lot 6 Click Score: 19    End of Session Equipment Utilized During Treatment: Oxygen Activity Tolerance: Patient tolerated treatment  well Patient left: in chair;with call bell/phone within reach Nurse Communication: Mobility status PT Visit Diagnosis: Difficulty in walking, not elsewhere classified (R26.2)     Time: 5003-7048 PT Time Calculation (min) (ACUTE ONLY): 37 min  Charges:  $Gait Training: 8-22 mins                     Blanchard Kelch PT Acute Rehabilitation Services Pager [phone] Office [phone]    Rada Hay 06/03/2020, 3:07 PM

## 2020-06-03 NOTE — Progress Notes (Signed)
Occupational Therapy Progress Note  Patient very motivated to work with therapy, progressing well at supervision level for toileting and sink side grooming/hygiene. Mod cues for PLB due to shallow respirations with activity. Patient does fluctuate between 70s and low 80s on 10L HFNC alone therefore implemented PNRB on 10L for standing recovery. At end of session seated in recliner patient recovers quickly on 9L HFNC alone with saturations in 90s.     06/03/20 1300  OT Visit Information  Last OT Received On 06/03/20  Assistance Needed +2 (line management/O2 tanks)  PT/OT/SLP Co-Evaluation/Treatment Yes  Reason for Co-Treatment To address functional/ADL transfers  OT goals addressed during session ADL's and self-care  History of Present Illness Diane Padilla is an 34 y.o. female past medical history of diabetes mellitus type 2 obstructive sleep apnea obesity who presents with shortness of breath was found to be COVID-19 positive  Precautions  Precaution Comments monitor o2 sats and RR, 10 L HFNC, desatted so added PNRB briefly  Pain Assessment  Pain Assessment No/denies pain  Cognition  Arousal/Alertness Awake/alert  Behavior During Therapy WFL for tasks assessed/performed  Overall Cognitive Status Within Functional Limits for tasks assessed  General Comments very motivated, pleasant  ADL  Overall ADL's  Needs assistance/impaired  Grooming Wash/dry hands;Supervision/safety;Standing  Statistician Supervision/safety;Ambulation;Regular Toilet;RW  Toileting- Clothing Manipulation and Hygiene Modified independent;Sitting/lateral lean  Toileting - Clothing Manipulation Details (indicate cue type and reason) for peri care after voiding  Functional mobility during ADLs Supervision/safety;Rolling walker  General ADL Comments mod cues during session for pursed lip breathing due to elevated RR and shallow breathing  Bed Mobility  General bed mobility comments in recliner  Balance  Overall  balance assessment Mild deficits observed, not formally tested  Transfers  Overall transfer level Needs assistance  Equipment used Rolling walker (2 wheeled)  Transfers Sit to/from Stand  Sit to Stand Supervision  General transfer comment stands from recliner and toilet, used armrests and rail.  General Comments  General comments (skin integrity, edema, etc.) trial patient on 10L without PNRB with ambulation O2 would fluctuate from 70s to 80s therefore donned to recover. at end of session patient on 9L HFNC with saturations in 90s at rest  OT - End of Session  Equipment Utilized During Treatment Oxygen;Rolling walker  Activity Tolerance Patient tolerated treatment well  Patient left in chair;with call bell/phone within reach  Nurse Communication Mobility status  OT Assessment/Plan  OT Plan Discharge plan remains appropriate  OT Visit Diagnosis Muscle weakness (generalized) (M62.81)  OT Frequency (ACUTE ONLY) Min 2X/week  Follow Up Recommendations Home health OT  OT Equipment Tub/shower seat  AM-PAC OT "6 Clicks" Daily Activity Outcome Measure (Version 2)  Help from another person eating meals? 4  Help from another person taking care of personal grooming? 3  Help from another person toileting, which includes using toliet, bedpan, or urinal? 3  Help from another person bathing (including washing, rinsing, drying)? 3  Help from another person to put on and taking off regular upper body clothing? 3  Help from another person to put on and taking off regular lower body clothing? 3  6 Click Score 19  OT Goal Progression  Progress towards OT goals Progressing toward goals  Acute Rehab OT Goals  Patient Stated Goal to walk  OT Goal Formulation With patient  Time For Goal Achievement 06/12/20  Potential to Achieve Goals Good  ADL Goals  Pt Will Perform Lower Body Dressing with modified independence  Pt Will Transfer  to Toilet with modified independence  Pt Will Perform Toileting -  Clothing Manipulation and hygiene with modified independence  Additional ADL Goal #1 Patient will perform 10 min functional activity or exercise activity as evidence of improving activity tolerance  Additional ADL Goal #2 Patient will demonstrate ability to use breathing techniques to maintain needed oxygenation during daily tasks  OT Time Calculation  OT Start Time (ACUTE ONLY) 1107  OT Stop Time (ACUTE ONLY) 1145  OT Time Calculation (min) 38 min  OT General Charges  $OT Visit 1 Visit  OT Treatments  $Self Care/Home Management  8-22 mins   Marlyce Huge OT OT pager: [phone]

## 2020-06-03 NOTE — Progress Notes (Signed)
ANTICOAGULATION CONSULT NOTE  Pharmacy Consult for Lovenox Indication: DVT  No Known Allergies  Patient Measurements: Height: 5\' 5"  (165.1 cm) Weight: 133.3 kg (293 lb 14 oz) IBW/kg (Calculated) : 57 Heparin Dosing Weight: 92 kg  Vital Signs: Temp: 98.7 F (37.1 C) (12/10 0802) Temp Source: Oral (12/10 0802) BP: 142/60 (12/10 0802) Pulse Rate: 109 (12/10 0802)  Labs: Recent Labs    06/01/20 0310 06/01/20 1136 06/01/20 2123 06/02/20 0301 06/03/20 0327  HGB 12.3  --   --   --  11.6*  HCT 37.7  --   --   --  35.7*  PLT 409*  --   --   --  423*  HEPARINUNFRC 0.30   < > 0.42 0.51 0.61  CREATININE 0.59  --   --   --   --    < > = values in this interval not displayed.    Estimated Creatinine Clearance: 134.3 mL/min (by C-G formula based on SCr of 0.59 mg/dL).   Medications:  Infusions:  . ceFEPime (MAXIPIME) IV Stopped (06/03/20 0829)  . heparin 1,600 Units/hr (06/03/20 0800)    Assessment: Pt is a 34 year old female admitted with respiratory failure due to COVID-19 PNA. Previously on therapeutic LMWH for acute bilateral DVT, but pharmacy consulted on 12/5 to transition anticoagulation to heparin drip. Last dose of enoxaparin given @ 0625 on 12/5. -11/30: Venous doppler: + Bilateral DVT -12/5: CTA: negative for PE  Today, 06/03/2020   Heparin level 0.61, therapeutic on heparin 1600 units/hr.    CBC: Hgb decreased to 11.6, Plt remains elevated  No bleeding or infusion related issues reported by RN.  SCr 0.59  Goal of Therapy:  Heparin level 0.3-0.7 units/ml Monitor platelets by anticoagulation protocol: Yes   Plan:  D/C heparin  Start Lovenox 1 mg/kg (130mg ) SQ Q12h   - Give first dose of Lovenox at the same time as Heparin drip is stopped. Follow up renal function, CBC Obtain LMWH level if needed at steady state.  14/03/2020 PharmD, BCPS Clinical Pharmacist [facility] main pharmacy [phone] 06/03/2020 9:52 AM

## 2020-06-03 NOTE — Progress Notes (Signed)
PROGRESS NOTE  Diane Padilla  GYJ:[id number] DOB: 1985-11-28 DOA: 05/20/2020 PCP: [facility]   Brief Narrative: Diane Padilla is a 34 y.o. female with a history of T2DM, OSA, PCOS, and obesity who presented with shortness of breath found to have covid-19 pneumonia requiring 6L supplemental oxygen which worsened to heated high flow requirement. She was admitted to the ICU/SDU and started on steroids. After initially declining other therapies, remdesivir and baricitinib were started after continued discussions and worsening clinical status. Lower extremity venous U/S revealed bilateral acute DVTs for which therapeutic dose anticoagulation was started on 11/30. Hypoxia has remained severe. Antibiotics empirically added 12/3 and course completed. As of 12/8, hypoxia has improved no longer requiring heated high flow oxygen.  Assessment & Plan: Active Problems:   Type 2 diabetes mellitus with hyperglycemia, without long-term current use of insulin (HCC)   Class 3 severe obesity with serious comorbidity and body mass index (BMI) of 50.0 to 59.9 in adult [facility])   Pneumonia due to COVID-19 virus   Acute hypoxemic respiratory failure due to COVID-19 [facility])   Hyponatremia   Acute respiratory failure with hypoxia (HCC)   Acute deep vein thrombosis (DVT) of distal vein of lower extremity (HCC)  Acute hypoxemic respiratory failure due to covid-19 pneumonia with concern for superimposed bacterial pneumonia:  - Patient provided proof of SARS-CoV-2 PCR positive initially on 05/14/2020. Therefore, isolation will continue for 21 days through 12/11 with airborne/contact precautions discontinuing on 12/12.  - Continue to urge use of IS and mobilize if possible. Per PCCM, ok to use NIPPV qHS and prn. D/w PCCM, will contact them with any further questions.  - S/p remdesivir x5 days (11/28 - 12/2) - Continue steroids. Inflammatory markers persistently elevated. Recent evidence  suggests adiposity may be target for continued viral inhabitance/infection. This may be causing a depot phenomenon resulting in inflammatory cytokine release. With clinical improvement, I do not think this indicates uncontrolled infection at this time. - Complete baricitinib (11/27 - 12/10) today - Completed 7 days abx for possible HCAP on 12/9.  - Encourage OOB, IS, FV, and awake proning if able - Monitor CMP and inflammatory markers in AM  ALT elevation: Likely due to covid-19  - Monitor intermittently  Acute bilateral peroneal vein DVTs: CTA chest showed no PE. - Continue anticoagulation with lovenox, planning to transition to DOAC prior to discharge for total 3-6 months Tx.   T2DM with steroid-induced hyperglycemia:  - Continue insulins as ordered, tapering steroids further. Control is improved without hypoglycemia.   - Linagliptin, continue  Thrush:  - Started po nystatin QID  Morbid obesity: Estimated body mass index is 48.9 kg/m as calculated from the following:   Height as of this encounter: 5\' 5"  (1.651 m).   Weight as of this encounter: 133.3 kg.  DVT prophylaxis: Lovenox Code Status: Full Family Communication: Husband and mother by phone daily.  Disposition Plan:  Status is: Inpatient, stable for transfer to medical floor. Telemetry personally reviewed this AM revealing only NSR and sinus tachycardia. Can DC tele.   Remains inpatient appropriate because:Inpatient level of care appropriate due to severity of illness  Dispo: The patient is from: Home              Anticipated d/c is to: Home              Anticipated d/c date is: > 3 days              Patient  currently is not medically stable to d/c.  Consultants:   PCCM  Procedures:   None  Antimicrobials:  Remdesivir 11/28 - 12/2   Vancomycin 12/3  Cefepime 12/3 - 12/10  Subjective: Getting up more easily, feels dyspnea is gradually improving to mild worse with coughing. No chest pain.    Objective: Vitals:   06/03/20 0500 06/03/20 0600 06/03/20 0700 06/03/20 0802  BP:  (!) 132/33  (!) 142/60  Pulse: 91 94 96 (!) 109  Resp: (!) 32 (!) 32 (!) 32 (!) 38  Temp:    98.7 F (37.1 C)  TempSrc:    Oral  SpO2: 95% 96% 96% (!) 87%  Weight:      Height:        Intake/Output Summary (Last 24 hours) at 06/03/2020 6962 Last data filed at 06/03/2020 0800 Gross per 24 hour  Intake 1008.73 ml  Output 1325 ml  Net -316.27 ml   Filed Weights   05/20/20 2118 05/31/20 1500 06/01/20 0503  Weight: (!) 139.2 kg 131.7 kg 133.3 kg   Gen: 34 y.o. female in no distress Pulm: Nonlabored, tachypneic in low 30's, breathing at rest with HFNC, no mask, clear anteriorly. CV: Regular rate and rhythm. No murmur, rub, or gallop. No JVD, no pitting dependent edema. GI: Abdomen soft, non-tender, non-distended, with normoactive bowel sounds.  Ext: Warm, no deformities Skin: No rashes, lesions or ulcers on visualized skin. Neuro: Alert and oriented. No focal neurological deficits. Psych: Judgement and insight appear fair. Mood euthymic & affect congruent. Behavior is appropriate.    Data Reviewed: I have personally reviewed following labs and imaging studies  CBC: Recent Labs  Lab 05/27/20 1505 05/28/20 0319 05/29/20 2239 05/30/20 0237 05/31/20 0557 06/01/20 0310 06/03/20 0327  WBC 24.8*   < > 23.5* 24.2* 20.6* 20.8* 21.5*  NEUTROABS 21.6*  --   --  20.7* 16.4* 17.6*  --   HGB 14.1   < > 12.5 12.7 12.2 12.3 11.6*  HCT 42.6   < > 39.1 38.9 37.5 37.7 35.7*  MCV 82.4   < > 83.7 83.1 83.0 82.5 83.4  PLT 441*   < > 497* 530* 382 409* 423*   < > = values in this interval not displayed.   Basic Metabolic Panel: Recent Labs  Lab 05/28/20 0319 05/29/20 0310 05/30/20 0237 05/31/20 0557 06/01/20 0310  NA 133* 134* 134* 132* 132*  K 4.7 5.1 4.6 4.0 4.6  CL 101 101 101 96* 97*  CO2 21* 22 23 25 24   GLUCOSE 201* 258* 230* 137* 163*  BUN 22* 24* 22* 17 14  CREATININE 0.68 0.69 0.65  0.53 0.59  CALCIUM 8.8* 8.9 9.1 8.8* 9.0   GFR: Estimated Creatinine Clearance: 134.3 mL/min (by C-G formula based on SCr of 0.59 mg/dL). Liver Function Tests: Recent Labs  Lab 05/28/20 0319 05/29/20 0310  AST 59* 54*  ALT 155* 172*  ALKPHOS 100 103  BILITOT 0.8 0.5  PROT 8.1 7.5  ALBUMIN 3.1* 3.0*   CBG: Recent Labs  Lab 06/02/20 0356 06/02/20 2133 06/03/20 0017 06/03/20 0428 06/03/20 0756  GLUCAP 155* 161* 248* 133* 142*   Anemia Panel: No results for input(s): VITAMINB12, FOLATE, FERRITIN, TIBC, IRON, RETICCTPCT in the last 72 hours. Urine analysis:    Component Value Date/Time   BILIRUBINUR negative 08/19/2018 1543   PROTEINUR Negative 08/19/2018 1543   UROBILINOGEN 0.2 08/19/2018 1543   NITRITE negative 08/19/2018 1543   LEUKOCYTESUR Moderate (2+) (A) 08/19/2018 1543   Recent Results (  from the past 240 hour(s))  Culture, blood (routine x 2)     Status: None   Collection Time: 05/27/20  3:05 PM   Specimen: BLOOD  Result Value Ref Range Status   Specimen Description   Final    BLOOD LEFT ANTECUBITAL Performed at [facility], [address], [place], [place] [address]    Special Requests   Final    BOTTLES DRAWN AEROBIC ONLY Blood Culture adequate volume Performed at [facility], [address], [place], [place] [address]    Culture   Final    NO GROWTH 5 DAYS Performed at [facility], [address], [place], [place] [address]    Report Status 06/01/2020 FINAL  Final  Culture, blood (routine x 2)     Status: None   Collection Time: 05/27/20  3:05 PM   Specimen: BLOOD LEFT HAND  Result Value Ref Range Status   Specimen Description   Final    BLOOD LEFT HAND Performed at [facility], [address] [place], [place] [address]    Special Requests   Final    BOTTLES DRAWN AEROBIC ONLY Blood Culture adequate volume Performed at [facility], [address],  [place], [place] [address]    Culture   Final    NO GROWTH 5 DAYS Performed at [facility], [address], [place], [place] [address]    Report Status 06/01/2020 FINAL  Final  MRSA PCR Screening     Status: None   Collection Time: 05/27/20  6:00 PM   Specimen: Nasal Mucosa; Nasopharyngeal  Result Value Ref Range Status   MRSA by PCR NEGATIVE NEGATIVE Final    Comment:        The GeneXpert MRSA Assay (FDA approved for NASAL specimens only), is one component of a comprehensive MRSA colonization surveillance program. It is not intended to diagnose MRSA infection nor to guide or monitor treatment for MRSA infections. Performed at [facility], [address], [place], [place] [address]       Radiology Studies: No results found.  Scheduled Meds: . (feeding supplement) PROSource Plus  30 mL Oral TID BM  . albuterol  2 puff Inhalation QID  . amLODipine  5 mg Oral Daily  . Chlorhexidine Gluconate Cloth  6 each Topical Q0600  . feeding supplement (NEPRO CARB STEADY)  237 mL Oral TID BM  . hydrALAZINE  50 mg Oral Q8H  . insulin aspart  0-20 Units Subcutaneous TID WC  . insulin aspart  0-5 Units Subcutaneous QHS  . insulin aspart  10 Units Subcutaneous TID WC  . insulin glargine  28 Units Subcutaneous BID  . linagliptin  5 mg Oral Daily  . mouth rinse  15 mL Mouth Rinse BID  . melatonin  5 mg Oral QHS  . [START ON 06/04/2020] methylPREDNISolone (SOLU-MEDROL) injection  10 mg Intravenous Daily  . metoprolol tartrate  75 mg Oral BID  . multivitamin with minerals  1 tablet Oral Daily  . nystatin  5 mL Oral QID  . saccharomyces boulardii  250 mg Oral BID  . sodium chloride flush  10-40 mL Intracatheter Q12H   Continuous Infusions: . ceFEPime (MAXIPIME) IV Stopped (06/03/20 0829)  . heparin 1,600 Units/hr (06/03/20 0800)     LOS: 14 days   Time spent: 35 minutes.  Tyrone Nine, MD [facility] [email or web address] 06/03/2020, 9:07 AM

## 2020-06-03 NOTE — TOC Progression Note (Signed)
Transition of Care [facility]) - Progression Note    Patient Details  Name: Diane Padilla MRN: [id number] Date of Birth: Nov 01, 1985  Transition of Care [facility]) CM/SW Contact  Golda Acre, RN Phone Number: 06/03/2020, 9:02 AM  Clinical Narrative:    Assessment & Plan: Active Problems:   Type 2 diabetes mellitus with hyperglycemia, without long-term current use of insulin (HCC)   Class 3 severe obesity with serious comorbidity and body mass index (BMI) of 50.0 to 59.9 in adult [facility])   Pneumonia due to COVID-19 virus   Acute hypoxemic respiratory failure due to COVID-19 [facility])   Hyponatremia   Acute respiratory failure with hypoxia (HCC)   Acute deep vein thrombosis (DVT) of distal vein of lower extremity (HCC)  Acute hypoxemic respiratory failure due to covid-19 pneumonia with concern for superimposed bacterial pneumonia:  - Patient provided proof of SARS-CoV-2 PCR positive initially on 05/14/2020. Therefore, isolation will continue for 21 days through 12/11 with airborne/contact precautions discontinuing on 12/12.  - Continue to urge use of IS and mobilize if possible. Per PCCM, ok to use NIPPV qHS and prn. D/w PCCM, will contact them with any further questions.  - S/p remdesivir x5 days (11/28 - 12/2) - Continue steroids. Inflammatory markers persistently elevated. Do not want to increase steroids due to hyperglycemia. - Continue baricitinib (11/27 - 12/10 )  - Completed 7 days abx for possible HCAP on 12/9.  - Encourage OOB, IS, FV, and awake proning if able - Monitor CMP and inflammatory markers in AM  ALT elevation: Likely due to covid-19  - Monitor in AM  Acute bilateral peroneal vein DVTs:  - Continue anticoagulation now with heparin. PLAN: To return to home with hhc if needed Following for progression.  Expected Discharge Plan: Home/Self Care Barriers to Discharge: No Barriers Identified  Expected Discharge Plan and Services Expected Discharge Plan: Home/Self  Care   Discharge Planning Services: CM Consult   Living arrangements for the past 2 months: Apartment                                       Social Determinants of Health (SDOH) Interventions    Readmission Risk Interventions No flowsheet data found.

## 2020-06-03 NOTE — Progress Notes (Signed)
ANTICOAGULATION CONSULT NOTE  Pharmacy Consult for Heparin Indication: DVT  No Known Allergies  Patient Measurements: Height: 5\' 5"  (165.1 cm) Weight: 133.3 kg (293 lb 14 oz) IBW/kg (Calculated) : 57 Heparin Dosing Weight: 92 kg  Vital Signs: Temp: 98.7 F (37.1 C) (12/10 0400) Temp Source: Oral (12/10 0400) BP: 132/33 (12/10 0600) Pulse Rate: 96 (12/10 0700)  Labs: Recent Labs    06/01/20 0310 06/01/20 1136 06/01/20 2123 06/02/20 0301 06/03/20 0327  HGB 12.3  --   --   --  11.6*  HCT 37.7  --   --   --  35.7*  PLT 409*  --   --   --  423*  HEPARINUNFRC 0.30   < > 0.42 0.51 0.61  CREATININE 0.59  --   --   --   --    < > = values in this interval not displayed.    Estimated Creatinine Clearance: 134.3 mL/min (by C-G formula based on SCr of 0.59 mg/dL).   Medications:  Infusions:  . ceFEPime (MAXIPIME) IV Stopped (06/03/20 0030)  . heparin 1,600 Units/hr (06/03/20 0222)    Assessment: Pt is a 34 year old female admitted with respiratory failure due to COVID-19 PNA. Previously on therapeutic LMWH for acute bilateral DVT, but pharmacy consulted on 12/5 to transition anticoagulation to heparin drip. Last dose of enoxaparin given @ 0625 on 12/5. -11/30: Venous doppler: + Bilateral DVT -12/5: CTA: negative for PE  Today, 06/03/2020   Heparin level 0.61, therapeutic on heparin 1600 units/hr.    CBC: Hgb decreased to 11.6, Plt remains elevated  No bleeding or infusion related issues reported by RN.  Goal of Therapy:  Heparin level 0.3-0.7 units/ml Monitor platelets by anticoagulation protocol: Yes   Plan:  Continue heparin IV infusion at 1600 units/hr Daily heparin level and CBC  Continue to monitor H&H and platelets  14/03/2020 PharmD, BCPS Clinical Pharmacist [facility] main pharmacy [phone] 06/03/2020 7:14 AM

## 2020-06-04 LAB — CBC
HCT: 34 % — ABNORMAL LOW (ref 36.0–46.0)
Hemoglobin: 11.2 g/dL — ABNORMAL LOW (ref 12.0–15.0)
MCH: 27.5 pg (ref 26.0–34.0)
MCHC: 32.9 g/dL (ref 30.0–36.0)
MCV: 83.3 fL (ref 80.0–100.0)
Platelets: 423 10*3/uL — ABNORMAL HIGH (ref 150–400)
RBC: 4.08 MIL/uL (ref 3.87–5.11)
RDW: 14.5 % (ref 11.5–15.5)
WBC: 18.8 10*3/uL — ABNORMAL HIGH (ref 4.0–10.5)
nRBC: 0 % (ref 0.0–0.2)

## 2020-06-04 LAB — COMPREHENSIVE METABOLIC PANEL
ALT: 474 U/L — ABNORMAL HIGH (ref 0–44)
AST: 106 U/L — ABNORMAL HIGH (ref 15–41)
Albumin: 3 g/dL — ABNORMAL LOW (ref 3.5–5.0)
Alkaline Phosphatase: 94 U/L (ref 38–126)
Anion gap: 10 (ref 5–15)
BUN: 19 mg/dL (ref 6–20)
CO2: 26 mmol/L (ref 22–32)
Calcium: 9.2 mg/dL (ref 8.9–10.3)
Chloride: 99 mmol/L (ref 98–111)
Creatinine, Ser: 0.77 mg/dL (ref 0.44–1.00)
GFR, Estimated: >60 mL/min (ref >60)
Glucose, Bld: 166 mg/dL — ABNORMAL HIGH (ref 70–99)
Potassium: 3.9 mmol/L (ref 3.5–5.1)
Sodium: 135 mmol/L (ref 135–145)
Total Bilirubin: 0.5 mg/dL (ref 0.3–1.2)
Total Protein: 7 g/dL (ref 6.5–8.1)

## 2020-06-04 LAB — C-REACTIVE PROTEIN: CRP: 2.2 mg/dL — ABNORMAL HIGH (ref <1.0)

## 2020-06-04 LAB — GLUCOSE, CAPILLARY
Glucose-Capillary: 116 mg/dL — ABNORMAL HIGH (ref 70–99)
Glucose-Capillary: 132 mg/dL — ABNORMAL HIGH (ref 70–99)
Glucose-Capillary: 135 mg/dL — ABNORMAL HIGH (ref 70–99)
Glucose-Capillary: 155 mg/dL — ABNORMAL HIGH (ref 70–99)
Glucose-Capillary: 198 mg/dL — ABNORMAL HIGH (ref 70–99)
Glucose-Capillary: 200 mg/dL — ABNORMAL HIGH (ref 70–99)
Glucose-Capillary: 205 mg/dL — ABNORMAL HIGH (ref 70–99)

## 2020-06-04 LAB — D-DIMER, QUANTITATIVE: D-Dimer, Quant: 3.68 ug/mL-FEU — ABNORMAL HIGH (ref 0.00–0.50)

## 2020-06-04 MED ORDER — METOPROLOL TARTRATE 50 MG PO TABS
100.0000 mg | ORAL_TABLET | Freq: Two times a day (BID) | ORAL | Status: DC
  Administered 2020-06-04 – 2020-06-11 (×15): 100 mg via ORAL
  Filled 2020-06-04 (×8): qty 2
  Filled 2020-06-04: qty 4
  Filled 2020-06-04 (×5): qty 2
  Filled 2020-06-04: qty 4

## 2020-06-04 NOTE — Progress Notes (Addendum)
PROGRESS NOTE  Diane Padilla  CZY:[id number] DOB: Nov 05, 1985 DOA: 05/20/2020 PCP: [facility]   Brief Narrative: Diane Padilla is a 34 y.o. female with a history of T2DM, OSA, PCOS, and obesity who presented with shortness of breath found to have covid-19 pneumonia requiring 6L supplemental oxygen which worsened to heated high flow requirement. She was admitted to the ICU/SDU and started on steroids. After initially declining other therapies, remdesivir and baricitinib were started after continued discussions and worsening clinical status. Lower extremity venous U/S revealed bilateral acute DVTs for which therapeutic dose anticoagulation was started on 11/30. Hypoxia has remained severe. Antibiotics empirically added 12/3 and course completed. As of 12/8, hypoxia has improved no longer requiring heated high flow oxygen.  Assessment & Plan: Active Problems:   Type 2 diabetes mellitus with hyperglycemia, without long-term current use of insulin (HCC)   Class 3 severe obesity with serious comorbidity and body mass index (BMI) of 50.0 to 59.9 in adult [facility])   Pneumonia due to COVID-19 virus   Acute hypoxemic respiratory failure due to COVID-19 [facility])   Hyponatremia   Acute respiratory failure with hypoxia (HCC)   Acute deep vein thrombosis (DVT) of distal vein of lower extremity (HCC)  Acute hypoxemic respiratory failure due to covid-19 pneumonia with concern for superimposed bacterial pneumonia:  - Patient provided proof of SARS-CoV-2 PCR positive initially on 05/14/2020. Therefore, isolation will continue for 21 days through 12/11 with airborne/contact precautions discontinuing on 12/12.  - Continue to urge use of IS and mobilize if possible. Per PCCM, ok to use NIPPV qHS and prn. D/w PCCM, will contact them with any further questions.  - S/p remdesivir x5 days (11/28 - 12/2), s/p baricitinib (11/27 - 12/10), s/p 7 days abx for possible HCAP on 12/9.  -  Continue steroids with taper. Inflammatory markers persistently elevated but responding.  - Encourage OOB, IS, FV, and awake proning if able - Monitor CMP and inflammatory markers in AM  Sinus tachycardia: Persistent. Possibly related to physiologic stress of ongoing respiratory failure, possibly related to subsegmental PE not visualized on CTA chest.   - Increase metoprolol - Check TSH in AM - Check echocardiogram  ALT elevation: Likely due to covid-19.  - Repeat in AM. ALT up to 474.  - Check RUQ U/S  Acute bilateral peroneal vein DVTs: CTA chest showed no PE. - Continue anticoagulation with lovenox, planning to transition to DOAC prior to discharge for total 3-6 months Tx.   T2DM with steroid-induced hyperglycemia:  - Continue insulins as ordered, tapering steroids further. Control is improved without hypoglycemia.   - Linagliptin, continue  Thrush:  - Improved, continue po nystatin QID  Morbid obesity: Estimated body mass index is 48.9 kg/m as calculated from the following:   Height as of this encounter: 5\' 5"  (1.651 m).   Weight as of this encounter: 133.3 kg.  DVT prophylaxis: Lovenox Code Status: Full Family Communication: Husband and mother by phone daily. No answer this afternoon.  Disposition Plan:  Status is: Inpatient, stable for transfer to medical floor.  Remains inpatient appropriate because:Inpatient level of care appropriate due to severity of illness  Dispo: The patient is from: Home              Anticipated d/c is to: Home              Anticipated d/c date is: > 3 days  Patient currently is not medically stable to d/c.  Consultants:   PCCM  Procedures:   None  Antimicrobials:  Remdesivir 11/28 - 12/2   Vancomycin 12/3  Cefepime 12/3 - 12/10  Subjective: No chest pain or palpitations. Getting up and ambulating with assistance more easily and frequently. Eating well. Shortness of breath is improved, intermittent. No abdominal pain  reported.   Objective: Vitals:   06/04/20 0849 06/04/20 0855 06/04/20 1019 06/04/20 1031  BP:  132/75 (!) 147/62   Pulse: (!) 115 (!) 116 (!) 129 (!) 175  Resp: (!) 36 (!) 23  (!) 37  Temp:  98.1 F (36.7 C)    TempSrc:  Oral    SpO2: 90% 92%  95%  Weight:      Height:        Intake/Output Summary (Last 24 hours) at 06/04/2020 1052 Last data filed at 06/03/2020 2156 Gross per 24 hour  Intake 20 ml  Output --  Net 20 ml   Filed Weights   05/20/20 2118 05/31/20 1500 06/01/20 0503  Weight: (!) 139.2 kg 131.7 kg 133.3 kg   Gen: 35 y.o. female in no distress Pulm: Nonlabored breathing supplemental oxygen, tachypneic, pulls only 500cc on IS which provokes cough. Diminished bilaterally. CV: Regular tachycardia. No murmur, rub, or gallop. No definite JVD, no pitting dependent edema. GI: Abdomen soft, non-tender, non-distended, with normoactive bowel sounds.  Ext: Warm, no deformities Skin: No rashes, lesions or ulcers on visualized skin. Neuro: Alert and oriented. No focal neurological deficits. Psych: Judgement and insight appear fair. Mood euthymic & affect congruent. Behavior is appropriate.    Data Reviewed: I have personally reviewed following labs and imaging studies  CBC: Recent Labs  Lab 05/30/20 0237 05/31/20 0557 06/01/20 0310 06/03/20 0327 06/04/20 0258  WBC 24.2* 20.6* 20.8* 21.5* 18.8*  NEUTROABS 20.7* 16.4* 17.6*  --   --   HGB 12.7 12.2 12.3 11.6* 11.2*  HCT 38.9 37.5 37.7 35.7* 34.0*  MCV 83.1 83.0 82.5 83.4 83.3  PLT 530* 382 409* 423* 423*   Basic Metabolic Panel: Recent Labs  Lab 05/29/20 0310 05/30/20 0237 05/31/20 0557 06/01/20 0310 06/04/20 0258  NA 134* 134* 132* 132* 135  K 5.1 4.6 4.0 4.6 3.9  CL 101 101 96* 97* 99  CO2 22 23 25 24 26   GLUCOSE 258* 230* 137* 163* 166*  BUN 24* 22* 17 14 19   CREATININE 0.69 0.65 0.53 0.59 0.77  CALCIUM 8.9 9.1 8.8* 9.0 9.2   GFR: Estimated Creatinine Clearance: 134.3 mL/min (by C-G formula based  on SCr of 0.77 mg/dL). Liver Function Tests: Recent Labs  Lab 05/29/20 0310 06/04/20 0258  AST 54* 106*  ALT 172* 474*  ALKPHOS 103 94  BILITOT 0.5 0.5  PROT 7.5 7.0  ALBUMIN 3.0* 3.0*   CBG: Recent Labs  Lab 06/03/20 1620 06/03/20 2032 06/04/20 0034 06/04/20 0449 06/04/20 0844  GLUCAP 201* 193* 200* 132* 155*   Anemia Panel: No results for input(s): VITAMINB12, FOLATE, FERRITIN, TIBC, IRON, RETICCTPCT in the last 72 hours. Urine analysis:    Component Value Date/Time   BILIRUBINUR negative 08/19/2018 1543   PROTEINUR Negative 08/19/2018 1543   UROBILINOGEN 0.2 08/19/2018 1543   NITRITE negative 08/19/2018 1543   LEUKOCYTESUR Moderate (2+) (A) 08/19/2018 1543   Recent Results (from the past 240 hour(s))  Culture, blood (routine x 2)     Status: None   Collection Time: 05/27/20  3:05 PM   Specimen: BLOOD  Result Value Ref Range  Status   Specimen Description   Final    BLOOD LEFT ANTECUBITAL Performed at [facility], [address] [place], [place] [address]    Special Requests   Final    BOTTLES DRAWN AEROBIC ONLY Blood Culture adequate volume Performed at [facility], [address], [place], [place] [address]    Culture   Final    NO GROWTH 5 DAYS Performed at [facility], [address], [place], [place] [address]    Report Status 06/01/2020 FINAL  Final  Culture, blood (routine x 2)     Status: None   Collection Time: 05/27/20  3:05 PM   Specimen: BLOOD LEFT HAND  Result Value Ref Range Status   Specimen Description   Final    BLOOD LEFT HAND Performed at [facility], [address] [place], [place] [address]    Special Requests   Final    BOTTLES DRAWN AEROBIC ONLY Blood Culture adequate volume Performed at [facility], [address], [place], [place] [address]    Culture   Final    NO GROWTH 5 DAYS Performed at [facility], [address],  [place], [place] [address]    Report Status 06/01/2020 FINAL  Final  MRSA PCR Screening     Status: None   Collection Time: 05/27/20  6:00 PM   Specimen: Nasal Mucosa; Nasopharyngeal  Result Value Ref Range Status   MRSA by PCR NEGATIVE NEGATIVE Final    Comment:        The GeneXpert MRSA Assay (FDA approved for NASAL specimens only), is one component of a comprehensive MRSA colonization surveillance program. It is not intended to diagnose MRSA infection nor to guide or monitor treatment for MRSA infections. Performed at [facility], [address], [place], [place] [address]       Radiology Studies: No results found.  Scheduled Meds: . (feeding supplement) PROSource Plus  30 mL Oral TID BM  . albuterol  2 puff Inhalation QID  . amLODipine  5 mg Oral Daily  . Chlorhexidine Gluconate Cloth  6 each Topical Q0600  . enoxaparin (LOVENOX) injection  130 mg Subcutaneous BID  . feeding supplement (NEPRO CARB STEADY)  237 mL Oral TID BM  . hydrALAZINE  50 mg Oral Q8H  . insulin aspart  0-20 Units Subcutaneous TID WC  . insulin aspart  0-5 Units Subcutaneous QHS  . insulin aspart  10 Units Subcutaneous TID WC  . insulin glargine  28 Units Subcutaneous BID  . linagliptin  5 mg Oral Daily  . mouth rinse  15 mL Mouth Rinse BID  . melatonin  5 mg Oral QHS  . methylPREDNISolone (SOLU-MEDROL) injection  10 mg Intravenous Daily  . metoprolol tartrate  100 mg Oral BID  . multivitamin with minerals  1 tablet Oral Daily  . nystatin  5 mL Oral QID  . saccharomyces boulardii  250 mg Oral BID  . sodium chloride flush  10-40 mL Intracatheter Q12H   Continuous Infusions:    LOS: 15 days   Time spent: 35 minutes.  Tyrone Nine, MD [facility] [email or web address] 06/04/2020, 10:52 AM

## 2020-06-05 ENCOUNTER — Inpatient Hospital Stay (HOSPITAL_COMMUNITY): Payer: BC Managed Care – PPO

## 2020-06-05 DIAGNOSIS — J9601 Acute respiratory failure with hypoxia: Secondary | ICD-10-CM

## 2020-06-05 DIAGNOSIS — R7989 Other specified abnormal findings of blood chemistry: Secondary | ICD-10-CM

## 2020-06-05 LAB — COMPREHENSIVE METABOLIC PANEL
ALT: 539 U/L — ABNORMAL HIGH (ref 0–44)
AST: 144 U/L — ABNORMAL HIGH (ref 15–41)
Albumin: 3 g/dL — ABNORMAL LOW (ref 3.5–5.0)
Alkaline Phosphatase: 82 U/L (ref 38–126)
Anion gap: 10 (ref 5–15)
BUN: 16 mg/dL (ref 6–20)
CO2: 26 mmol/L (ref 22–32)
Calcium: 8.9 mg/dL (ref 8.9–10.3)
Chloride: 99 mmol/L (ref 98–111)
Creatinine, Ser: 0.66 mg/dL (ref 0.44–1.00)
GFR, Estimated: >60 mL/min (ref >60)
Glucose, Bld: 87 mg/dL (ref 70–99)
Potassium: 3.9 mmol/L (ref 3.5–5.1)
Sodium: 135 mmol/L (ref 135–145)
Total Bilirubin: 0.3 mg/dL (ref 0.3–1.2)
Total Protein: 6.9 g/dL (ref 6.5–8.1)

## 2020-06-05 LAB — GLUCOSE, CAPILLARY
Glucose-Capillary: 108 mg/dL — ABNORMAL HIGH (ref 70–99)
Glucose-Capillary: 142 mg/dL — ABNORMAL HIGH (ref 70–99)
Glucose-Capillary: 192 mg/dL — ABNORMAL HIGH (ref 70–99)
Glucose-Capillary: 81 mg/dL (ref 70–99)
Glucose-Capillary: 85 mg/dL (ref 70–99)
Glucose-Capillary: 86 mg/dL (ref 70–99)

## 2020-06-05 LAB — ECHOCARDIOGRAM COMPLETE
Area-P 1/2: 3.48 cm2
Height: 65 in
S' Lateral: 2.5 cm
Weight: 4701.97 oz

## 2020-06-05 LAB — C-REACTIVE PROTEIN: CRP: 1.1 mg/dL — ABNORMAL HIGH (ref <1.0)

## 2020-06-05 LAB — TSH: TSH: 3.196 u[IU]/mL (ref 0.350–4.500)

## 2020-06-05 LAB — D-DIMER, QUANTITATIVE: D-Dimer, Quant: 3.27 ug/mL-FEU — ABNORMAL HIGH (ref 0.00–0.50)

## 2020-06-05 MED ORDER — INSULIN GLARGINE 100 UNIT/ML ~~LOC~~ SOLN
15.0000 [IU] | Freq: Two times a day (BID) | SUBCUTANEOUS | Status: DC
  Administered 2020-06-05 – 2020-06-06 (×2): 15 [IU] via SUBCUTANEOUS
  Filled 2020-06-05 (×2): qty 0.15

## 2020-06-05 NOTE — Progress Notes (Signed)
  Echocardiogram 2D Echocardiogram has been performed.  Augustine Radar 06/05/2020, 12:04 PM

## 2020-06-05 NOTE — Progress Notes (Signed)
PROGRESS NOTE  Diane Padilla  CWC:[id number] DOB: 10-05-1985 DOA: 05/20/2020 PCP: [facility]   Brief Narrative: Diane Padilla is a 34 y.o. female with a history of T2DM, OSA, PCOS, and obesity who presented with shortness of breath found to have covid-19 pneumonia requiring 6L supplemental oxygen which worsened to heated high flow requirement. She was admitted to the ICU/SDU and started on steroids. After initially declining other therapies, remdesivir and baricitinib were started after continued discussions and worsening clinical status. Lower extremity venous U/S revealed bilateral acute DVTs for which therapeutic dose anticoagulation was started on 11/30. Hypoxia has remained severe. Antibiotics empirically added 12/3 and course completed. As of 12/8, hypoxia has improved no longer requiring heated high flow oxygen.  Assessment & Plan: Active Problems:   Type 2 diabetes mellitus with hyperglycemia, without long-term current use of insulin (HCC)   Class 3 severe obesity with serious comorbidity and body mass index (BMI) of 50.0 to 59.9 in adult [facility])   Pneumonia due to COVID-19 virus   Acute hypoxemic respiratory failure due to COVID-19 [facility])   Hyponatremia   Acute respiratory failure with hypoxia (HCC)   Acute deep vein thrombosis (DVT) of distal vein of lower extremity (HCC)  Acute hypoxemic respiratory failure due to covid-19 pneumonia with concern for superimposed bacterial pneumonia:  - SARS-CoV-2 PCR positive initially on 05/14/2020, off isolation as of 06/05/2020.   - Continue to urge use of IS and mobilize if possible. Per PCCM, ok to use NIPPV qHS and prn. D/w PCCM, will contact them with any further questions.  - S/p remdesivir x5 days (11/28 - 12/2), s/p baricitinib (11/27 - 12/10), s/p 7 days abx for possible HCAP on 12/9.  - Continue steroids with taper.  - Encourage OOB, IS, FV, and awake proning if able - Monitor CMP, CRP which is  improving.   Sinus tachycardia: Persistent. Possibly related to physiologic stress of ongoing respiratory failure, possibly related to subsegmental PE not visualized on CTA chest. TSH wnl.  - Continue metoprolol 100mg  po BID - Check echocardiogram, pending  LFT elevation: Likely due to covid-19 and suspect an element of hepatic steatosis. - Continue daily monitoring. Avoid nephrotoxins. We've completed baricitinib and remdesivir. - Check RUQ U/S, pending. Can eat after this is completed.  Acute bilateral peroneal vein DVTs: CTA chest showed no PE. - Continue anticoagulation with lovenox, planning to transition to DOAC prior to discharge for total 3-6 months Tx.   T2DM with steroid-induced hyperglycemia:  - Continue insulin. Fasting is <100 this AM. Will decrease basal and monitor for needs to decrease further with continued steroid taper.   - Linagliptin, continue  Thrush:  - Improved, continue po nystatin QID  Morbid obesity: Estimated body mass index is 48.9 kg/m as calculated from the following:   Height as of this encounter: 5\' 5"  (1.651 m).   Weight as of this encounter: 133.3 kg.  DVT prophylaxis: Lovenox Code Status: Full Family Communication: Husband by phone this morning. Disposition Plan:  Status is: Inpatient   Remains inpatient appropriate because:Inpatient level of care appropriate due to severity of illness  Dispo: The patient is from: Home              Anticipated d/c is to: Home              Anticipated d/c date is: > 3 days              Patient currently is not medically stable  to d/c.  Consultants:   PCCM  Procedures:   None  Antimicrobials:  Remdesivir 11/28 - 12/2   Vancomycin 12/3  Cefepime 12/3 - 12/10  Subjective: Feeling in higher spirits, dyspnea improved, though remains severe on exertion. No chest pain or palpitations. Moved to med-surg and now off isolation. Excited about family visitation today.  Objective: Vitals:   06/05/20  0630 06/05/20 0758 06/05/20 0900 06/05/20 1046  BP: (!) 126/57   (!) 103/47  Pulse: 93   90  Resp: (!) 23   16  Temp: 98.8 F (37.1 C)   (!) 97.1 F (36.2 C)  TempSrc: Oral     SpO2: 97% 91% (!) 89% 96%  Weight:      Height:        Intake/Output Summary (Last 24 hours) at 06/05/2020 1136 Last data filed at 06/04/2020 2315 Gross per 24 hour  Intake 200 ml  Output --  Net 200 ml   Filed Weights   05/20/20 2118 05/31/20 1500 06/01/20 0503  Weight: (!) 139.2 kg 131.7 kg 133.3 kg   Gen: 34 y.o. female in no distress Pulm: Nonlabored but remains tachypneic at rest, diminished. CV: Regular tachycardia in 110's without murmur, rub, or gallop. No JVD, no pitting dependent edema. GI: Abdomen soft, minimal tenderness in RUQ without rebound, guarding, or hepatomegaly palpable, non-distended, with normoactive bowel sounds.  Ext: Warm, no deformities Skin: No rashes, lesions or ulcers on visualized skin. Neuro: Alert and oriented. No focal neurological deficits. Psych: Judgement and insight appear fair. Mood euthymic & affect congruent. Behavior is appropriate.    Data Reviewed: I have personally reviewed following labs and imaging studies  CBC: Recent Labs  Lab 05/30/20 0237 05/31/20 0557 06/01/20 0310 06/03/20 0327 06/04/20 0258  WBC 24.2* 20.6* 20.8* 21.5* 18.8*  NEUTROABS 20.7* 16.4* 17.6*  --   --   HGB 12.7 12.2 12.3 11.6* 11.2*  HCT 38.9 37.5 37.7 35.7* 34.0*  MCV 83.1 83.0 82.5 83.4 83.3  PLT 530* 382 409* 423* 423*   Basic Metabolic Panel: Recent Labs  Lab 05/30/20 0237 05/31/20 0557 06/01/20 0310 06/04/20 0258 06/05/20 0409  NA 134* 132* 132* 135 135  K 4.6 4.0 4.6 3.9 3.9  CL 101 96* 97* 99 99  CO2 23 25 24 26 26   GLUCOSE 230* 137* 163* 166* 87  BUN 22* 17 14 19 16   CREATININE 0.65 0.53 0.59 0.77 0.66  CALCIUM 9.1 8.8* 9.0 9.2 8.9   GFR: Estimated Creatinine Clearance: 134.3 mL/min (by C-G formula based on SCr of 0.66 mg/dL). Liver Function  Tests: Recent Labs  Lab 06/04/20 0258 06/05/20 0409  AST 106* 144*  ALT 474* 539*  ALKPHOS 94 82  BILITOT 0.5 0.3  PROT 7.0 6.9  ALBUMIN 3.0* 3.0*   CBG: Recent Labs  Lab 06/04/20 1715 06/04/20 1959 06/04/20 2346 06/05/20 0407 06/05/20 0753  GLUCAP 135* 116* 198* 85 86   Anemia Panel: No results for input(s): VITAMINB12, FOLATE, FERRITIN, TIBC, IRON, RETICCTPCT in the last 72 hours. Urine analysis:    Component Value Date/Time   BILIRUBINUR negative 08/19/2018 1543   PROTEINUR Negative 08/19/2018 1543   UROBILINOGEN 0.2 08/19/2018 1543   NITRITE negative 08/19/2018 1543   LEUKOCYTESUR Moderate (2+) (A) 08/19/2018 1543   Recent Results (from the past 240 hour(s))  Culture, blood (routine x 2)     Status: None   Collection Time: 05/27/20  3:05 PM   Specimen: BLOOD  Result Value Ref Range Status   Specimen  Description   Final    BLOOD LEFT ANTECUBITAL Performed at [facility], [address] [place], [place] [address]    Special Requests   Final    BOTTLES DRAWN AEROBIC ONLY Blood Culture adequate volume Performed at [facility], [address] [place], [place] [address]    Culture   Final    NO GROWTH 5 DAYS Performed at [facility], [address] [place], [place] [address]    Report Status 06/01/2020 FINAL  Final  Culture, blood (routine x 2)     Status: None   Collection Time: 05/27/20  3:05 PM   Specimen: BLOOD LEFT HAND  Result Value Ref Range Status   Specimen Description   Final    BLOOD LEFT HAND Performed at [facility], [address], [place], [place] [address]    Special Requests   Final    BOTTLES DRAWN AEROBIC ONLY Blood Culture adequate volume Performed at [facility], [address] [place], [place] [address]    Culture   Final    NO GROWTH 5 DAYS Performed at [facility], [address] [place], [place] [address]    Report Status  06/01/2020 FINAL  Final  MRSA PCR Screening     Status: None   Collection Time: 05/27/20  6:00 PM   Specimen: Nasal Mucosa; Nasopharyngeal  Result Value Ref Range Status   MRSA by PCR NEGATIVE NEGATIVE Final    Comment:        The GeneXpert MRSA Assay (FDA approved for NASAL specimens only), is one component of a comprehensive MRSA colonization surveillance program. It is not intended to diagnose MRSA infection nor to guide or monitor treatment for MRSA infections. Performed at [facility], [address] [place], [place] [address]       Radiology Studies: No results found.  Scheduled Meds:  (feeding supplement) PROSource Plus  30 mL Oral TID BM   albuterol  2 puff Inhalation QID   amLODipine  5 mg Oral Daily   Chlorhexidine Gluconate Cloth  6 each Topical Q0600   enoxaparin (LOVENOX) injection  130 mg Subcutaneous BID   feeding supplement (NEPRO CARB STEADY)  237 mL Oral TID BM   hydrALAZINE  50 mg Oral Q8H   insulin aspart  0-20 Units Subcutaneous TID WC   insulin aspart  0-5 Units Subcutaneous QHS   insulin aspart  10 Units Subcutaneous TID WC   insulin glargine  28 Units Subcutaneous BID   linagliptin  5 mg Oral Daily   mouth rinse  15 mL Mouth Rinse BID   melatonin  5 mg Oral QHS   methylPREDNISolone (SOLU-MEDROL) injection  10 mg Intravenous Daily   metoprolol tartrate  100 mg Oral BID   multivitamin with minerals  1 tablet Oral Daily   nystatin  5 mL Oral QID   saccharomyces boulardii  250 mg Oral BID   sodium chloride flush  10-40 mL Intracatheter Q12H   Continuous Infusions:    LOS: 16 days   Time spent: 35 minutes.  Tyrone Nine, MD [facility] [email or web address] 06/05/2020, 11:36 AM

## 2020-06-05 NOTE — Progress Notes (Signed)
Patient transferred from bed to chair with minimal stand by assistance. 02 sats dropped to 40-50s on 7 LPM HFNC. Bumped to 10 LPM HFNC. O2 sats now 88-92%. Patient deep breathing. MD aware.

## 2020-06-06 LAB — COMPREHENSIVE METABOLIC PANEL
ALT: 567 U/L — ABNORMAL HIGH (ref 0–44)
AST: 147 U/L — ABNORMAL HIGH (ref 15–41)
Albumin: 3 g/dL — ABNORMAL LOW (ref 3.5–5.0)
Alkaline Phosphatase: 82 U/L (ref 38–126)
Anion gap: 9 (ref 5–15)
BUN: 16 mg/dL (ref 6–20)
CO2: 26 mmol/L (ref 22–32)
Calcium: 8.7 mg/dL — ABNORMAL LOW (ref 8.9–10.3)
Chloride: 101 mmol/L (ref 98–111)
Creatinine, Ser: 0.64 mg/dL (ref 0.44–1.00)
GFR, Estimated: >60 mL/min (ref >60)
Glucose, Bld: 94 mg/dL (ref 70–99)
Potassium: 4.1 mmol/L (ref 3.5–5.1)
Sodium: 136 mmol/L (ref 135–145)
Total Bilirubin: 0.4 mg/dL (ref 0.3–1.2)
Total Protein: 6.8 g/dL (ref 6.5–8.1)

## 2020-06-06 LAB — GLUCOSE, CAPILLARY
Glucose-Capillary: 108 mg/dL — ABNORMAL HIGH (ref 70–99)
Glucose-Capillary: 125 mg/dL — ABNORMAL HIGH (ref 70–99)
Glucose-Capillary: 133 mg/dL — ABNORMAL HIGH (ref 70–99)
Glucose-Capillary: 149 mg/dL — ABNORMAL HIGH (ref 70–99)
Glucose-Capillary: 159 mg/dL — ABNORMAL HIGH (ref 70–99)
Glucose-Capillary: 95 mg/dL (ref 70–99)

## 2020-06-06 MED ORDER — INSULIN ASPART 100 UNIT/ML ~~LOC~~ SOLN
6.0000 [IU] | Freq: Three times a day (TID) | SUBCUTANEOUS | Status: DC
  Administered 2020-06-06 – 2020-06-08 (×6): 6 [IU] via SUBCUTANEOUS

## 2020-06-06 MED ORDER — INSULIN GLARGINE 100 UNIT/ML ~~LOC~~ SOLN
10.0000 [IU] | Freq: Two times a day (BID) | SUBCUTANEOUS | Status: DC
  Administered 2020-06-06 – 2020-06-07 (×2): 10 [IU] via SUBCUTANEOUS
  Filled 2020-06-06 (×2): qty 0.1

## 2020-06-06 NOTE — Progress Notes (Signed)
PROGRESS NOTE  Diane Padilla  WUJ:[id number] DOB: 1985/07/21 DOA: 05/20/2020 PCP: [facility]   Brief Narrative: Diane Padilla is a 34 y.o. female with a history of T2DM, OSA, PCOS, and obesity who presented with shortness of breath found to have covid-19 pneumonia requiring 6L supplemental oxygen which worsened to heated high flow requirement. She was admitted to the ICU/SDU and started on steroids. After initially declining other therapies, remdesivir and baricitinib were started after continued discussions and worsening clinical status. Lower extremity venous U/S revealed bilateral acute DVTs for which therapeutic dose anticoagulation was started on 11/30. Hypoxia has remained severe. Antibiotics empirically added 12/3 and course completed. As of 12/8, hypoxia has improved no longer requiring heated high flow oxygen.  Assessment & Plan: Active Problems:   Type 2 diabetes mellitus with hyperglycemia, without long-term current use of insulin (HCC)   Class 3 severe obesity with serious comorbidity and body mass index (BMI) of 50.0 to 59.9 in adult [facility](HCC)   Pneumonia due to COVID-19 virus   Acute hypoxemic respiratory failure due to COVID-19 [facility](HCC)   Hyponatremia   Acute respiratory failure with hypoxia (HCC)   Acute deep vein thrombosis (DVT) of distal vein of lower extremity (HCC)  Acute hypoxemic respiratory failure due to covid-19 pneumonia with concern for superimposed bacterial pneumonia:  - SARS-CoV-2 PCR positive initially on 05/14/2020, off isolation as of 06/05/2020.   - Continue OOB, IS - S/p remdesivir x5 days (11/28 - 12/2), s/p baricitinib (11/27 - 12/10), s/p 7 days abx for possible HCAP on 12/9.  - Continue steroids with taper.   Sinus tachycardia: Persistent. Possibly related to physiologic stress of ongoing respiratory failure, possibly related to subsegmental PE not visualized on CTA chest. TSH wnl. Echocardiogram 12/12 was normal. -  Continue metoprolol 100mg  po BID  LFT elevation, hepatic steatosis: Likely due to covid-19 and suspect an element of hepatic steatosis which was seen on U/S 12/12. - Continue daily monitoring. We've completed baricitinib and remdesivir. -No obstruction on U/S, LFTs slightly up today, though may be reaching plateau.  Acute bilateral peroneal vein DVTs: CTA chest showed no PE. - Continue anticoagulation with lovenox, planning to transition to DOAC prior to discharge for total 3-6 months Tx.   T2DM with steroid-induced hyperglycemia:  - Continue tapered insulin, will further decrease basal today.  - Linagliptin, continue  Thrush:  - Improved, continue po nystatin QID  Morbid obesity: Estimated body mass index is 48.9 kg/m as calculated from the following:   Height as of this encounter: 5\' 5"  (1.651 m).   Weight as of this encounter: 133.3 kg.  DVT prophylaxis: Lovenox Code Status: Full Family Communication: Husband by phone daily Disposition Plan:  Status is: Inpatient   Remains inpatient appropriate because:Inpatient level of care appropriate due to severity of illness  Dispo: The patient is from: Home              Anticipated d/c is to: Home              Anticipated d/c date is: 2 days              Patient currently is not medically stable to d/c.  Consultants:   PCCM  Procedures:   None  Antimicrobials:  Remdesivir 11/28 - 12/2   Vancomycin 12/3  Cefepime 12/3 - 12/10  Subjective: Getting up more often, still quite short of breath with this and improves with rest and oxygen. No chest pain or bleeding noted.  CBGs improving significantly.  Objective: Vitals:   06/06/20 0412 06/06/20 0815 06/06/20 1038 06/06/20 1222  BP: (!) 109/58 139/70 106/74 117/72  Pulse: 91 (!) 113 (!) 105 95  Resp: 18 20 19 20   Temp: 98.6 F (37 C) 97.7 F (36.5 C) 98 F (36.7 C) 97.7 F (36.5 C)  TempSrc: Oral Oral    SpO2: 95% 91% 100% 99%  Weight:      Height:         Intake/Output Summary (Last 24 hours) at 06/06/2020 1442 Last data filed at 06/06/2020 1231 Gross per 24 hour  Intake 710 ml  Output --  Net 710 ml   Filed Weights   05/20/20 2118 05/31/20 1500 06/01/20 0503  Weight: (!) 139.2 kg 131.7 kg 133.3 kg   Gen: 34 y.o. female in no distress Pulm: Nonlabored, remains tachypneic with improved aeration and slight crackles on exam. CV: Regular with rate in 100's. No murmur, rub, or gallop. No JVD, no pitting dependent edema. GI: Abdomen soft, non-tender, non-distended, with normoactive bowel sounds.  Ext: Warm, no deformities Skin: No rashes, lesions or ulcers on visualized skin. Neuro: Alert and oriented. No focal neurological deficits. Psych: Judgement and insight appear fair. Mood euthymic & affect congruent. Behavior is appropriate.    Data Reviewed: I have personally reviewed following labs and imaging studies  CBC: Recent Labs  Lab 05/31/20 0557 06/01/20 0310 06/03/20 0327 06/04/20 0258  WBC 20.6* 20.8* 21.5* 18.8*  NEUTROABS 16.4* 17.6*  --   --   HGB 12.2 12.3 11.6* 11.2*  HCT 37.5 37.7 35.7* 34.0*  MCV 83.0 82.5 83.4 83.3  PLT 382 409* 423* 423*   Basic Metabolic Panel: Recent Labs  Lab 05/31/20 0557 06/01/20 0310 06/04/20 0258 06/05/20 0409 06/06/20 0402  NA 132* 132* 135 135 136  K 4.0 4.6 3.9 3.9 4.1  CL 96* 97* 99 99 101  CO2 25 24 26 26 26   GLUCOSE 137* 163* 166* 87 94  BUN 17 14 19 16 16   CREATININE 0.53 0.59 0.77 0.66 0.64  CALCIUM 8.8* 9.0 9.2 8.9 8.7*   GFR: Estimated Creatinine Clearance: 134.3 mL/min (by C-G formula based on SCr of 0.64 mg/dL). Liver Function Tests: Recent Labs  Lab 06/04/20 0258 06/05/20 0409 06/06/20 0402  AST 106* 144* 147*  ALT 474* 539* 567*  ALKPHOS 94 82 82  BILITOT 0.5 0.3 0.4  PROT 7.0 6.9 6.8  ALBUMIN 3.0* 3.0* 3.0*   CBG: Recent Labs  Lab 06/05/20 2024 06/05/20 2335 06/06/20 0413 06/06/20 0731 06/06/20 1106  GLUCAP 108* 81 95 108* 133*   Anemia  Panel: No results for input(s): VITAMINB12, FOLATE, FERRITIN, TIBC, IRON, RETICCTPCT in the last 72 hours. Urine analysis:    Component Value Date/Time   BILIRUBINUR negative 08/19/2018 1543   PROTEINUR Negative 08/19/2018 1543   UROBILINOGEN 0.2 08/19/2018 1543   NITRITE negative 08/19/2018 1543   LEUKOCYTESUR Moderate (2+) (A) 08/19/2018 1543   Recent Results (from the past 240 hour(s))  Culture, blood (routine x 2)     Status: None   Collection Time: 05/27/20  3:05 PM   Specimen: BLOOD  Result Value Ref Range Status   Specimen Description   Final    BLOOD LEFT ANTECUBITAL Performed at [facility], [address] [place], [place]    Special Requests   Final    BOTTLES DRAWN AEROBIC ONLY Blood Culture adequate volume Performed at [facility], [address], [place], [place]  Culture   Final    NO GROWTH 5 DAYS Performed at [facility], [address], [place], [place] [address]    Report Status 06/01/2020 FINAL  Final  Culture, blood (routine x 2)     Status: None   Collection Time: 05/27/20  3:05 PM   Specimen: BLOOD LEFT HAND  Result Value Ref Range Status   Specimen Description   Final    BLOOD LEFT HAND Performed at [facility], [address], [place], [place] [address]    Special Requests   Final    BOTTLES DRAWN AEROBIC ONLY Blood Culture adequate volume Performed at [facility], [address], [place], [place] [address]    Culture   Final    NO GROWTH 5 DAYS Performed at [facility], [address], [place], [place] [address]    Report Status 06/01/2020 FINAL  Final  MRSA PCR Screening     Status: None   Collection Time: 05/27/20  6:00 PM   Specimen: Nasal Mucosa; Nasopharyngeal  Result Value Ref Range Status   MRSA by PCR NEGATIVE NEGATIVE Final    Comment:        The GeneXpert MRSA Assay (FDA approved for NASAL specimens only), is one  component of a comprehensive MRSA colonization surveillance program. It is not intended to diagnose MRSA infection nor to guide or monitor treatment for MRSA infections. Performed at [facility], [address] [place], [place] [address]       Radiology Studies: ECHOCARDIOGRAM COMPLETE  Result Date: 06/05/2020    ECHOCARDIOGRAM REPORT   Patient Name:   ALIAHNA STATZER Date of Exam: 06/05/2020 Medical Rec #:  [id number]         Height:       65.0 in Accession #:    [id number]        Weight:       293.9 lb Date of Birth:  10/11/1985         BSA:          2.330 m Patient Age:    34 years          BP:           103/47 mmHg Patient Gender: F                 HR:           89 bpm. Exam Location:  Inpatient Procedure: 2D Echo Indications:    Elevated D dimer  History:        Patient has no prior history of Echocardiogram examinations.                 Covid pneumonia; Signs/Symptoms:Dyspnea.  Sonographer:    Eulah Pont RDCS Referring Phys: [id number] Tyrone Nine IMPRESSIONS  1. Left ventricular ejection fraction, by estimation, is 65 to 70%. The left ventricle has normal function. The left ventricle has no regional wall motion abnormalities. Left ventricular diastolic parameters were normal.  2. Right ventricular systolic function is normal. The right ventricular size is normal. There is normal pulmonary artery systolic pressure.  3. The mitral valve is normal in structure. No evidence of mitral valve regurgitation. No evidence of mitral stenosis.  4. The aortic valve is normal in structure. Aortic valve regurgitation is not visualized. No aortic stenosis is present.  5. The inferior vena cava is normal in size with greater than 50% respiratory variability, suggesting right atrial pressure  of 3 mmHg. Conclusion(s)/Recommendation(s): Normal biventricular function without evidence of hemodynamically significant valvular heart disease. FINDINGS  Left Ventricle: Left ventricular ejection  fraction, by estimation, is 65 to 70%. The left ventricle has normal function. The left ventricle has no regional wall motion abnormalities. The left ventricular internal cavity size was normal in size. There is  no left ventricular hypertrophy. Left ventricular diastolic parameters were normal. Right Ventricle: The right ventricular size is normal. No increase in right ventricular wall thickness. Right ventricular systolic function is normal. There is normal pulmonary artery systolic pressure. Left Atrium: Left atrial size was normal in size. Right Atrium: Right atrial size was normal in size. Pericardium: There is no evidence of pericardial effusion. Mitral Valve: The mitral valve is normal in structure. No evidence of mitral valve regurgitation. No evidence of mitral valve stenosis. Tricuspid Valve: The tricuspid valve is normal in structure. Tricuspid valve regurgitation is not demonstrated. No evidence of tricuspid stenosis. Aortic Valve: The aortic valve is normal in structure. Aortic valve regurgitation is not visualized. No aortic stenosis is present. Pulmonic Valve: The pulmonic valve was normal in structure. Pulmonic valve regurgitation is not visualized. No evidence of pulmonic stenosis. Aorta: The aortic root is normal in size and structure. Venous: The inferior vena cava is normal in size with greater than 50% respiratory variability, suggesting right atrial pressure of 3 mmHg. IAS/Shunts: No atrial level shunt detected by color flow Doppler.  LEFT VENTRICLE PLAX 2D LVIDd:         4.80 cm  Diastology LVIDs:         2.50 cm  LV e' medial:    8.92 cm/s LV PW:         0.80 cm  LV E/e' medial:  5.8 LV IVS:        0.90 cm  LV e' lateral:   11.00 cm/s LVOT diam:     2.00 cm  LV E/e' lateral: 4.7 LV SV:         67 LV SV Index:   29 LVOT Area:     3.14 cm  RIGHT VENTRICLE RV S prime:     9.79 cm/s TAPSE (M-mode): 1.9 cm LEFT ATRIUM           Index       RIGHT ATRIUM           Index LA diam:      2.80 cm 1.20  cm/m  RA Area:     10.20 cm LA Vol (A2C): 26.0 ml 11.16 ml/m RA Volume:   18.00 ml  7.73 ml/m LA Vol (A4C): 24.8 ml 10.65 ml/m  AORTIC VALVE LVOT Vmax:   128.00 cm/s LVOT Vmean:  91.300 cm/s LVOT VTI:    0.214 m  AORTA Ao Root diam: 2.70 cm Ao Asc diam:  2.50 cm MITRAL VALVE MV Area (PHT): 3.48 cm    SHUNTS MV Decel Time: 218 msec    Systemic VTI:  0.21 m MV E velocity: 51.70 cm/s  Systemic Diam: 2.00 cm MV A velocity: 43.20 cm/s MV E/A ratio:  1.20 Tobias Alexander MD Electronically signed by Tobias Alexander MD Signature Date/Time: 06/05/2020/12:07:26 PM    Final    US Abdomen Limited RUQ (LIVER/GB)  Result Date: 06/05/2020 CLINICAL DATA:  Elevated LFTs EXAM: ULTRASOUND ABDOMEN LIMITED RIGHT UPPER QUADRANT COMPARISON:  None. FINDINGS: Gallbladder: No gallstones or wall thickening visualized. No sonographic Murphy sign noted by sonographer. Common bile duct: Diameter: 0.5 cm, within normal limits. Liver: No focal lesion  identified. Liver parenchymal echogenicity is diffusely increased. Portal vein is patent on color Doppler imaging with normal direction of blood flow towards the liver. Other: None. IMPRESSION: Diffusely increased liver parenchymal echogenicity which is nonspecific but most commonly seen with hepatic steatosis. Electronically Signed   By: Emmaline Kluver M.D.   On: 06/05/2020 12:28    Scheduled Meds:  (feeding supplement) PROSource Plus  30 mL Oral TID BM   albuterol  2 puff Inhalation QID   amLODipine  5 mg Oral Daily   Chlorhexidine Gluconate Cloth  6 each Topical Q0600   enoxaparin (LOVENOX) injection  130 mg Subcutaneous BID   feeding supplement (NEPRO CARB STEADY)  237 mL Oral TID BM   hydrALAZINE  50 mg Oral Q8H   insulin aspart  0-20 Units Subcutaneous TID WC   insulin aspart  0-5 Units Subcutaneous QHS   insulin aspart  10 Units Subcutaneous TID WC   insulin glargine  15 Units Subcutaneous BID   linagliptin  5 mg Oral Daily   mouth rinse  15 mL Mouth  Rinse BID   melatonin  5 mg Oral QHS   metoprolol tartrate  100 mg Oral BID   multivitamin with minerals  1 tablet Oral Daily   nystatin  5 mL Oral QID   saccharomyces boulardii  250 mg Oral BID   sodium chloride flush  10-40 mL Intracatheter Q12H   Continuous Infusions:    LOS: 17 days   Time spent: 35 minutes.  Tyrone Nine, MD [facility] [email or web address] 06/06/2020, 2:42 PM

## 2020-06-06 NOTE — Progress Notes (Signed)
ANTICOAGULATION CONSULT NOTE - Follow Up Consult  Pharmacy Consult for Lovenox Indication: DVT  No Known Allergies  Patient Measurements: Height: 5\' 5"  (165.1 cm) Weight: 133.3 kg (293 lb 14 oz) IBW/kg (Calculated) : 57  Vital Signs: Temp: 97.7 F (36.5 C) (12/13 0815) Temp Source: Oral (12/13 0815) BP: 139/70 (12/13 0815) Pulse Rate: 113 (12/13 0815)  Labs: Recent Labs    06/04/20 0258 06/05/20 0409 06/06/20 0402  HGB 11.2*  --   --   HCT 34.0*  --   --   PLT 423*  --   --   CREATININE 0.77 0.66 0.64    Estimated Creatinine Clearance: 134.3 mL/min (by C-G formula based on SCr of 0.64 mg/dL).   Medications:  Scheduled:  . (feeding supplement) PROSource Plus  30 mL Oral TID BM  . albuterol  2 puff Inhalation QID  . amLODipine  5 mg Oral Daily  . Chlorhexidine Gluconate Cloth  6 each Topical Q0600  . enoxaparin (LOVENOX) injection  130 mg Subcutaneous BID  . feeding supplement (NEPRO CARB STEADY)  237 mL Oral TID BM  . hydrALAZINE  50 mg Oral Q8H  . insulin aspart  0-20 Units Subcutaneous TID WC  . insulin aspart  0-5 Units Subcutaneous QHS  . insulin aspart  10 Units Subcutaneous TID WC  . insulin glargine  15 Units Subcutaneous BID  . linagliptin  5 mg Oral Daily  . mouth rinse  15 mL Mouth Rinse BID  . melatonin  5 mg Oral QHS  . methylPREDNISolone (SOLU-MEDROL) injection  10 mg Intravenous Daily  . metoprolol tartrate  100 mg Oral BID  . multivitamin with minerals  1 tablet Oral Daily  . nystatin  5 mL Oral QID  . saccharomyces boulardii  250 mg Oral BID  . sodium chloride flush  10-40 mL Intracatheter Q12H   Infusions:   PRN: acetaminophen, chlorpheniramine-HYDROcodone, guaiFENesin-dextromethorphan, HYDROcodone-acetaminophen, lip balm, ondansetron **OR** ondansetron (ZOFRAN) IV, phenol, sodium chloride, sodium chloride flush, traZODone  Assessment: Pt is a 34 year old female admitted with respiratory failure due to COVID-19 PNA. 11/30: Venous doppler:  + Bilateral DVT,  12/5: 14/5 for PE. Pharmacy consulted for anticoagulation.    Goal of Therapy:  Anti-Xa level 0.6-1 units/ml 4hrs after LMWH dose given Monitor platelets by anticoagulation protocol: Yes   Plan:  Continue Lovenox 1mg /kg SQ q12h Monitor CBC, signs/symptoms of bleeding Follow up plan for transition to oral anticoagulation  DVV:OHYWVPXT, PharmD, BCPS Pharmacy: [phone] 06/06/2020,9:34 AM

## 2020-06-06 NOTE — Progress Notes (Signed)
Nutrition Follow-up  DOCUMENTATION CODES:   Morbid obesity  INTERVENTION:   -Prosource Plus PO TID, each provides 100 kcals and 15g protein -Nepro Shake po TID, each supplement provides 425 kcal and 19 grams protein -Multivitamin with minerals daily  NUTRITION DIAGNOSIS:   Increased nutrient needs related to acute illness,catabolic illness (COVID-19 infection) as evidenced by estimated needs.  Ongoing.  GOAL:   Patient will meet greater than or equal to 90% of their needs  Progressing.  MONITOR:   PO intake,Supplement acceptance,Labs,Weight trends  ASSESSMENT:   34 year-old female with medical history of DM, PCOS, GERD, vitamin D deficiency, depression and OSA on CPAP. She was in her usual state of health until 11/18 when she developed dry, hacking cough and was found to be COVID-19 positive. Symptoms progressed to include loss of taste and smell, fever, chills, and shortness of breath. She presented to the ED on 11/26 due to shortness of breath and was found to be 84% on room air. On 11/29 she progressed to require 30 L via HFNC and NRB.  COVID-19+ since 11/25. Off precautions now.  Patient currently consuming 75-100% of meals. Pt is drinking supplements.  Admission weight: 306 lbs. Current weight: 293 lbs Weight change since admission: -13 lbs  Medications: Florastor, Multivitamin with minerals daily  Labs reviewed: CBGs: 95-133  Diet Order:   Diet Order            Diet Carb Modified Fluid consistency: Thin; Room service appropriate? Yes  Diet effective now                 EDUCATION NEEDS:   Not appropriate for education at this time  Skin:  Skin Assessment: Reviewed RN Assessment  Last BM:  12/10  Height:   Ht Readings from Last 1 Encounters:  05/31/20 5\' 5"  (1.651 m)    Weight:   Wt Readings from Last 1 Encounters:  06/01/20 133.3 kg    BMI:  Body mass index is 48.9 kg/m.  Estimated Nutritional Needs:   Kcal:  2000-2200  kcal  Protein:  100-115 grams  Fluid:  >/= 2.2 L/day  14/08/21, MS, RD, LDN Inpatient Clinical Dietitian Contact information available via Amion

## 2020-06-06 NOTE — Progress Notes (Signed)
Physical Therapy Treatment Patient Details Name: Diane Padilla MRN: [id number] DOB: [birth year] Today's Date: 06/06/2020    History of Present Illness Diane Padilla is an 34 y.o. female past medical history of diabetes mellitus type 2 obstructive sleep apnea obesity who presents with shortness of breath was found to be COVID-19 positive, bilateral DVT's.    PT Comments    The patient ambulated x 150' with 3 stop  And rest breaks standing. SPO2 on 8 L  Reading from finger remained >90% until patient returned to room. SPO2 dropped into 70's. Patient  Performed PLB with gradual return to > 90% over ~ 2 minutes time. May consider shorter distance and try to decrease O2 for working towards Dc home.  Follow Up Recommendations  Home health PT     Equipment Recommendations  May need a RW  Recommendations for Other Services       Precautions / Restrictions Precautions Precaution Comments: monitor o2 sats and RR,  8L HFNC,    Mobility  Bed Mobility               General bed mobility comments: in recliner  Transfers     Transfers: Sit to/from Stand Sit to Stand: Supervision         General transfer comment: stands from recliner , used armrests  Ambulation/Gait Ambulation/Gait assistance: Min guard Gait Distance (Feet): 150 Feet Assistive device: Pushed wheelchair Gait Pattern/deviations: Step-through pattern;Decreased stride length Gait velocity: decreased   General Gait Details: slow but steady balance, did ambulate x 15' withput  UE support, pt. gaurded. spouse present. patient stopped x 3 for recovery, remained standing   Stairs             Wheelchair Mobility    Modified Rankin (Stroke Patients Only)       Balance Overall balance assessment: Mild deficits observed, not formally tested                                          Cognition   Behavior During Therapy: [facility] for tasks assessed/performed                                    General Comments: able to slow breaths down when SOB      Exercises      General Comments        Pertinent Vitals/Pain Pain Assessment: No/denies pain    Home Living                      Prior Function            PT Goals (current goals can now be found in the care plan section) Progress towards PT goals: Progressing toward goals    Frequency    Min 3X/week      PT Plan Current plan remains appropriate    Co-evaluation              AM-PAC PT "6 Clicks" Mobility   Outcome Measure  Help needed turning from your back to your side while in a flat bed without using bedrails?: None Help needed moving from lying on your back to sitting on the side of a flat bed without using bedrails?: None Help needed moving to and from a bed to a chair (including  a wheelchair)?: A Little Help needed standing up from a chair using your arms (e.g., wheelchair or bedside chair)?: A Little Help needed to walk in hospital room?: A Little Help needed climbing 3-5 steps with a railing? : A Lot 6 Click Score: 19    End of Session Equipment Utilized During Treatment: Oxygen Activity Tolerance: Patient tolerated treatment well Patient left: in chair;with call bell/phone within reach;with family/visitor present Nurse Communication: Mobility status PT Visit Diagnosis: Difficulty in walking, not elsewhere classified (R26.2)     Time: 5573-2202 PT Time Calculation (min) (ACUTE ONLY): 26 min  Charges:  $Gait Training: 23-37 mins                     Blanchard Kelch PT Acute Rehabilitation Services Pager [phone] Office [phone]  Rada Hay 06/06/2020, 4:35 PM

## 2020-06-07 LAB — GLUCOSE, CAPILLARY
Glucose-Capillary: 145 mg/dL — ABNORMAL HIGH (ref 70–99)
Glucose-Capillary: 114 mg/dL — ABNORMAL HIGH (ref 70–99)
Glucose-Capillary: 143 mg/dL — ABNORMAL HIGH (ref 70–99)
Glucose-Capillary: 128 mg/dL — ABNORMAL HIGH (ref 70–99)
Glucose-Capillary: 110 mg/dL — ABNORMAL HIGH (ref 70–99)
Glucose-Capillary: 135 mg/dL — ABNORMAL HIGH (ref 70–99)

## 2020-06-07 LAB — COMPREHENSIVE METABOLIC PANEL
ALT: 628 U/L — ABNORMAL HIGH (ref 0–44)
AST: 161 U/L — ABNORMAL HIGH (ref 15–41)
Albumin: 3.1 g/dL — ABNORMAL LOW (ref 3.5–5.0)
Alkaline Phosphatase: 89 U/L (ref 38–126)
Anion gap: 10 (ref 5–15)
BUN: 20 mg/dL (ref 6–20)
CO2: 24 mmol/L (ref 22–32)
Calcium: 9.2 mg/dL (ref 8.9–10.3)
Chloride: 102 mmol/L (ref 98–111)
Creatinine, Ser: 0.67 mg/dL (ref 0.44–1.00)
GFR, Estimated: >60 mL/min (ref >60)
Glucose, Bld: 131 mg/dL — ABNORMAL HIGH (ref 70–99)
Potassium: 4 mmol/L (ref 3.5–5.1)
Sodium: 136 mmol/L (ref 135–145)
Total Bilirubin: 0.3 mg/dL (ref 0.3–1.2)
Total Protein: 6.7 g/dL (ref 6.5–8.1)

## 2020-06-07 LAB — HEPATITIS PANEL, ACUTE
HCV Ab: NONREACTIVE
Hep A IgM: NONREACTIVE
Hep B C IgM: NONREACTIVE
Hepatitis B Surface Ag: NONREACTIVE

## 2020-06-07 LAB — C-REACTIVE PROTEIN: CRP: 1.4 mg/dL — ABNORMAL HIGH (ref <1.0)

## 2020-06-07 LAB — D-DIMER, QUANTITATIVE: D-Dimer, Quant: 2.69 ug/mL-FEU — ABNORMAL HIGH (ref 0.00–0.50)

## 2020-06-07 NOTE — Progress Notes (Signed)
PROGRESS NOTE  Diane Padilla  IFO:[id number] DOB: [birth year] DOA: 05/20/2020 PCP: [facility]   Brief Narrative: Diane Padilla is a 34 y.o. female with a history of T2DM, OSA, PCOS, and obesity who presented with shortness of breath found to have covid-19 pneumonia requiring 6L supplemental oxygen which worsened to heated high flow requirement. She was admitted to the ICU/SDU and started on steroids. After initially declining other therapies, remdesivir and baricitinib were started after continued discussions and worsening clinical status. Lower extremity venous U/S revealed bilateral acute DVTs for which therapeutic dose anticoagulation was started on 11/30. Hypoxia has remained severe. Antibiotics empirically added 12/3 and course completed. As of 12/8, hypoxia has improved no longer requiring heated high flow oxygen and patient was transferred to medical floor.  Assessment & Plan: Active Problems:   Type 2 diabetes mellitus with hyperglycemia, without long-term current use of insulin (HCC)   Class 3 severe obesity with serious comorbidity and body mass index (BMI) of 50.0 to 59.9 in adult [facility])   Pneumonia due to COVID-19 virus   Acute hypoxemic respiratory failure due to COVID-19 [facility])   Hyponatremia   Acute respiratory failure with hypoxia (HCC)   Acute deep vein thrombosis (DVT) of distal vein of lower extremity (HCC)  Acute hypoxemic respiratory failure due to covid-19 pneumonia with concern for superimposed bacterial pneumonia:  - SARS-CoV-2 PCR positive initially on 05/14/2020, off isolation as of 06/05/2020.   - Continue OOB, IS; making gains with PT. - S/p remdesivir x5 days (11/28 - 12/2), s/p baricitinib (11/27 - 12/10), s/p 7 days abx for possible HCAP on 12/9.  - Will stop steroids.  Sinus tachycardia: Persistent. Possibly related to physiologic stress of ongoing respiratory failure, possibly related to subsegmental PE not visualized on  CTA chest. TSH wnl. Echocardiogram 12/12 was normal. - Continue metoprolol 100mg  po BID. Improving.  LFT elevation, hepatic steatosis: Likely due to covid-19, though worsening. U/S with hepatic steatosis without obstructive signs. - Continue daily monitoring. We've completed baricitinib and remdesivir and are avoiding hepatotoxins. - Check acute hepatitis panel  Acute bilateral peroneal vein DVTs: CTA chest showed no PE. - Continue anticoagulation with lovenox, planning to transition to DOAC prior to discharge for total 3-6 months Tx.  T2DM with steroid-induced hyperglycemia: HbA1c 6.6%.  - Continue tapered insulin, DC basal insulin now that steroids stopped - Linagliptin, continue  Thrush:  - Improved, continue po nystatin QID  Morbid obesity: Estimated body mass index is 48.9 kg/m as calculated from the following:   Height as of this encounter: 5\' 5"  (1.651 m).   Weight as of this encounter: 133.3 kg.  DVT prophylaxis: Lovenox Code Status: Full Family Communication: Husband at bedside today. Disposition Plan:  Status is: Inpatient   Remains inpatient appropriate because:Inpatient level of care appropriate due to severity of illness  Dispo: The patient is from: Home              Anticipated d/c is to: Home              Anticipated d/c date is: 2 days              Patient currently is not medically stable to d/c.  Consultants:   PCCM  Procedures:   None  Antimicrobials:  Remdesivir 11/28 - 12/2   Vancomycin 12/3  Cefepime 12/3 - 12/10  Subjective: Worked with PT today and able to ambulate much farther than previously, though still requiring HFNC and still desaturating  into 70%'s with dyspnea. Overall dyspnea is improved especially at rest, no chest pain, eating well. Glycemic control at goal. No abdominal pain mentioned.  Objective: Vitals:   06/07/20 0326 06/07/20 0600 06/07/20 1024 06/07/20 1325  BP: (!) 105/57  139/73 107/70  Pulse: 83  (!) 110 92  Resp:  18   19  Temp: 97.9 F (36.6 C)   97.9 F (36.6 C)  TempSrc: Oral     SpO2: 100% 94%  96%  Weight:      Height:        Intake/Output Summary (Last 24 hours) at 06/07/2020 1456 Last data filed at 06/07/2020 1300 Gross per 24 hour  Intake 1783 ml  Output --  Net 1783 ml   Filed Weights   05/20/20 2118 05/31/20 1500 06/01/20 0503  Weight: (!) 139.2 kg 131.7 kg 133.3 kg   Gen: 34 y.o. female in no distress Pulm: Nonlabored breathing supplemental oxygen. Clear. CV: Regular rate and rhythm. No murmur, rub, or gallop. No JVD, no dependent edema. GI: Abdomen soft, Negative murphy's, non-tender, non-distended, with normoactive bowel sounds.  Ext: Warm, no deformities Skin: No rashes, lesions or ulcers on visualized skin. Neuro: Alert and oriented. No focal neurological deficits. Psych: Judgement and insight appear fair. Mood euthymic & affect congruent. Behavior is appropriate.    Data Reviewed: I have personally reviewed following labs and imaging studies  CBC: Recent Labs  Lab 06/01/20 0310 06/03/20 0327 06/04/20 0258  WBC 20.8* 21.5* 18.8*  NEUTROABS 17.6*  --   --   HGB 12.3 11.6* 11.2*  HCT 37.7 35.7* 34.0*  MCV 82.5 83.4 83.3  PLT 409* 423* 423*   Basic Metabolic Panel: Recent Labs  Lab 06/01/20 0310 06/04/20 0258 06/05/20 0409 06/06/20 0402 06/07/20 0352  NA 132* 135 135 136 136  K 4.6 3.9 3.9 4.1 4.0  CL 97* 99 99 101 102  CO2 24 26 26 26 24   GLUCOSE 163* 166* 87 94 131*  BUN 14 19 16 16 20   CREATININE 0.59 0.77 0.66 0.64 0.67  CALCIUM 9.0 9.2 8.9 8.7* 9.2   GFR: Estimated Creatinine Clearance: 134.3 mL/min (by C-G formula based on SCr of 0.67 mg/dL). Liver Function Tests: Recent Labs  Lab 06/04/20 0258 06/05/20 0409 06/06/20 0402 06/07/20 0352  AST 106* 144* 147* 161*  ALT 474* 539* 567* 628*  ALKPHOS 94 82 82 89  BILITOT 0.5 0.3 0.4 0.3  PROT 7.0 6.9 6.8 6.7  ALBUMIN 3.0* 3.0* 3.0* 3.1*   CBG: Recent Labs  Lab 06/06/20 1942  06/06/20 2335 06/07/20 0328 06/07/20 0737 06/07/20 1148  GLUCAP 159* 125* 145* 114* 143*   Anemia Panel: No results for input(s): VITAMINB12, FOLATE, FERRITIN, TIBC, IRON, RETICCTPCT in the last 72 hours. Urine analysis:    Component Value Date/Time   BILIRUBINUR negative 08/19/2018 1543   PROTEINUR Negative 08/19/2018 1543   UROBILINOGEN 0.2 08/19/2018 1543   NITRITE negative 08/19/2018 1543   LEUKOCYTESUR Moderate (2+) (A) 08/19/2018 1543   No results found for this or any previous visit (from the past 240 hour(s)).    Radiology Studies: No results found.  Scheduled Meds: . (feeding supplement) PROSource Plus  30 mL Oral TID BM  . albuterol  2 puff Inhalation QID  . amLODipine  5 mg Oral Daily  . Chlorhexidine Gluconate Cloth  6 each Topical Q0600  . enoxaparin (LOVENOX) injection  130 mg Subcutaneous BID  . feeding supplement (NEPRO CARB STEADY)  237 mL Oral TID BM  .  insulin aspart  0-20 Units Subcutaneous TID WC  . insulin aspart  0-5 Units Subcutaneous QHS  . insulin aspart  6 Units Subcutaneous TID WC  . insulin glargine  10 Units Subcutaneous BID  . linagliptin  5 mg Oral Daily  . mouth rinse  15 mL Mouth Rinse BID  . melatonin  5 mg Oral QHS  . metoprolol tartrate  100 mg Oral BID  . multivitamin with minerals  1 tablet Oral Daily  . nystatin  5 mL Oral QID  . saccharomyces boulardii  250 mg Oral BID  . sodium chloride flush  10-40 mL Intracatheter Q12H   Continuous Infusions:    LOS: 18 days   Time spent: 35 minutes.  Tyrone Nine, MD [facility] [email or web address] 06/07/2020, 2:56 PM

## 2020-06-08 DIAGNOSIS — R7989 Other specified abnormal findings of blood chemistry: Secondary | ICD-10-CM

## 2020-06-08 LAB — COMPREHENSIVE METABOLIC PANEL
ALT: 656 U/L — ABNORMAL HIGH (ref 0–44)
AST: 166 U/L — ABNORMAL HIGH (ref 15–41)
Albumin: 3.3 g/dL — ABNORMAL LOW (ref 3.5–5.0)
Alkaline Phosphatase: 95 U/L (ref 38–126)
Anion gap: 11 (ref 5–15)
BUN: 15 mg/dL (ref 6–20)
CO2: 26 mmol/L (ref 22–32)
Calcium: 8.9 mg/dL (ref 8.9–10.3)
Chloride: 99 mmol/L (ref 98–111)
Creatinine, Ser: 0.62 mg/dL (ref 0.44–1.00)
GFR, Estimated: >60 mL/min (ref >60)
Glucose, Bld: 137 mg/dL — ABNORMAL HIGH (ref 70–99)
Potassium: 4.1 mmol/L (ref 3.5–5.1)
Sodium: 136 mmol/L (ref 135–145)
Total Bilirubin: 0.3 mg/dL (ref 0.3–1.2)
Total Protein: 7.2 g/dL (ref 6.5–8.1)

## 2020-06-08 LAB — GLUCOSE, CAPILLARY
Glucose-Capillary: 120 mg/dL — ABNORMAL HIGH (ref 70–99)
Glucose-Capillary: 124 mg/dL — ABNORMAL HIGH (ref 70–99)
Glucose-Capillary: 125 mg/dL — ABNORMAL HIGH (ref 70–99)
Glucose-Capillary: 144 mg/dL — ABNORMAL HIGH (ref 70–99)
Glucose-Capillary: 162 mg/dL — ABNORMAL HIGH (ref 70–99)
Glucose-Capillary: 189 mg/dL — ABNORMAL HIGH (ref 70–99)

## 2020-06-08 MED ORDER — INSULIN ASPART 100 UNIT/ML ~~LOC~~ SOLN
4.0000 [IU] | Freq: Three times a day (TID) | SUBCUTANEOUS | Status: DC
  Administered 2020-06-08 – 2020-06-11 (×10): 4 [IU] via SUBCUTANEOUS

## 2020-06-08 NOTE — Progress Notes (Signed)
Occupational Therapy Progress Note  Patient demonstrating increased carry over of PLB techniques this session during sink side ADLs, requiring only min cues. Patient briefly drop to 88% on 4L but able to recover to low 90s with pursed lip breathing. Educate patient on proper technique of incentive spirometer as patient inhaling quickly with shallow breaths and not allowing blue marker to return to 0 between repetitions. Also discuss with patient and RN to have patient ambulate to bathroom with RN staff in order to build endurance necessary for participation in daily routine at home.     06/08/20 1300  OT Visit Information  Last OT Received On 06/08/20  Assistance Needed +1  History of Present Illness Diane Padilla is an 34 y.o. female past medical history of diabetes mellitus type 2 obstructive sleep apnea obesity who presents with shortness of breath was found to be COVID-19 positive, bilateral DVT's.  Precautions  Precaution Comments monitor o2 sats and RR,  4L HFNC,  Pain Assessment  Pain Assessment No/denies pain  Cognition  Arousal/Alertness Awake/alert  Behavior During Therapy WFL for tasks assessed/performed  Overall Cognitive Status Within Functional Limits for tasks assessed  ADL  Overall ADL's  Needs assistance/impaired  Grooming Oral care;Wash/dry face;Supervision/safety;Standing  Grooming Details (indicate cue type and reason) patient requires increased time and min cues for pursed lip breathing  Toilet Transfer Supervision/safety;RW;Ambulation  Toilet Transfer Details (indicate cue type and reason) patient requires increased time for all functional ambulation/mobility due to decreased activity tolerance and cardiopulmonary status, patient briefly drop to 88% on 4L with activity but recovers with PLB to low 90s  Functional mobility during ADLs Supervision/safety;Rolling walker  General ADL Comments patient demonstrating improvement in functional activity tolerance and  utilization of PLB techniques requiring only min cues this session.  Bed Mobility  General bed mobility comments in recliner  Balance  Overall balance assessment Mild deficits observed, not formally tested  Transfers  Overall transfer level Needs assistance  Equipment used Rolling walker (2 wheeled)  Transfers Sit to/from Stand  Sit to Stand Supervision  General transfer comment utilizes arm rests to power up to standing  OT - End of Session  Equipment Utilized During Treatment Oxygen;Rolling walker  Activity Tolerance Patient tolerated treatment well  Patient left in chair;with call bell/phone within reach  Nurse Communication Other (comment);Mobility status (O2 saturations)  OT Assessment/Plan  OT Plan Discharge plan remains appropriate  OT Visit Diagnosis Muscle weakness (generalized) (M62.81)  OT Frequency (ACUTE ONLY) Min 2X/week  Follow Up Recommendations Home health OT  OT Equipment Tub/shower seat  AM-PAC OT "6 Clicks" Daily Activity Outcome Measure (Version 2)  Help from another person eating meals? 4  Help from another person taking care of personal grooming? 3  Help from another person toileting, which includes using toliet, bedpan, or urinal? 3  Help from another person bathing (including washing, rinsing, drying)? 3  Help from another person to put on and taking off regular upper body clothing? 3  Help from another person to put on and taking off regular lower body clothing? 3  6 Click Score 19  OT Goal Progression  Progress towards OT goals Progressing toward goals  Acute Rehab OT Goals  Patient Stated Goal to walk  OT Goal Formulation With patient  Time For Goal Achievement 06/12/20  Potential to Achieve Goals Good  ADL Goals  Pt Will Perform Lower Body Dressing with modified independence  Pt Will Transfer to Toilet with modified independence  Pt Will Perform Toileting - Clothing  Manipulation and hygiene with modified independence  Additional ADL Goal #1  Patient will perform 10 min functional activity or exercise activity as evidence of improving activity tolerance  Additional ADL Goal #2 Patient will demonstrate ability to use breathing techniques to maintain needed oxygenation during daily tasks  OT Time Calculation  OT Start Time (ACUTE ONLY) 0932  OT Stop Time (ACUTE ONLY) 1000  OT Time Calculation (min) 28 min  OT General Charges  $OT Visit 1 Visit  OT Treatments  $Self Care/Home Management  23-37 mins   Marlyce Huge OT OT pager: [phone]

## 2020-06-08 NOTE — Progress Notes (Addendum)
°   06/08/20 1725  Assess: MEWS Score  Temp 98.5 F (36.9 C)  BP 115/70  Pulse Rate (!) 117  Resp (!) 22  SpO2 90 %  O2 Device Nasal Cannula  Patient Activity (if Appropriate) In chair  O2 Flow Rate (L/min) 4 L/min  Assess: MEWS Score  MEWS Temp 0  MEWS Systolic 0  MEWS Pulse 2  MEWS RR 1  MEWS LOC 0  MEWS Score 3  MEWS Score Color Yellow  Assess: if the MEWS score is Yellow or Red  Were vital signs taken at a resting state? Yes  Focused Assessment No change from prior assessment  Early Detection of Sepsis Score *See Row Information* High  MEWS guidelines implemented *See Row Information* No, previously yellow, continue vital signs every 4 hours  Treat  MEWS Interventions Administered scheduled meds/treatments  Pain Scale 0-10  Pain Score 0  Faces Pain Scale 0  Take Vital Signs  Increase Vital Sign Frequency  Yellow: Q 2hr X 2 then Q 4hr X 2, if remains yellow, continue Q 4hrs  Escalate  MEWS: Escalate Yellow: discuss with charge nurse/RN and consider discussing with provider and RRT  Notify: Charge Nurse/RN  Name of Charge Nurse/RN Notified Dekina RN  Date Charge Nurse/RN Notified 06/08/20  Time Charge Nurse/RN Notified 1830  Notify: Provider  Provider Name/Title Elgergawy  Date Provider Notified 06/08/20  Time Provider Notified 1830  Notification Type  (Amion)  Notification Reason Other (Comment)  Response Other (Comment) (Waiting for providers response)  Document  Patient Outcome Other (Comment);Stabilized after interventions  Progress note created (see row info) Yes

## 2020-06-08 NOTE — Progress Notes (Addendum)
   06/08/20 1511  Assess: MEWS Score  Temp 97.8 F (36.6 C)  BP (!) 146/67  Pulse Rate (!) 119  Resp (!) 22  Level of Consciousness Alert  SpO2 91 %  O2 Device Nasal Cannula  Patient Activity (if Appropriate) In chair  O2 Flow Rate (L/min) 4 L/min  Assess: if the MEWS score is Yellow or Red  Were vital signs taken at a resting state? Yes  Focused Assessment No change from prior assessment  Early Detection of Sepsis Score *See Row Information* Medium  MEWS guidelines implemented *See Row Information* No, previously yellow, continue vital signs every 4 hours  Treat  MEWS Interventions Administered prn meds/treatments  Pain Scale 0-10  Pain Score 0  Take Vital Signs  Increase Vital Sign Frequency  Yellow: Q 2hr X 2 then Q 4hr X 2, if remains yellow, continue Q 4hrs  Escalate  MEWS: Escalate Yellow: discuss with charge nurse/RN and consider discussing with provider and RRT  Notify: Charge Nurse/RN  Name of Charge Nurse/RN Notified Dekina RN  Date Charge Nurse/RN Notified 06/08/20  Time Charge Nurse/RN Notified 1537  Notify: Provider  Provider Name/Title Elgergawy  Date Provider Notified 06/08/20  Time Provider Notified 1538  Notification Type  (Amion)  Notification Reason Other (Comment)  Response No new orders  Date of Provider Response 06/08/20  Time of Provider Response 1538  Document  Patient Outcome Stabilized after interventions  Progress note created (see row info) Yes

## 2020-06-08 NOTE — Progress Notes (Signed)
Physical Therapy Treatment Patient Details Name: Diane Padilla MRN: [id number] DOB: Jun 09, 1986 Today's Date: 06/08/2020    History of Present Illness Diane Padilla is an 34 y.o. female past medical history of diabetes mellitus type 2 obstructive sleep apnea obesity who presents with shortness of breath was found to be COVID-19 positive, bilateral DVT's.    PT Comments    The patient ambulated in room and in [place] x 100' on 4 L [place]. SPo2 95% at rest, ambulating with RW, dropping to 86-87%. Encouraged PLB and to use IS and flutter  Post ambulation to assist with control of RR. HR resting 112, up to 130's with mobility.. will instruct in HEP / activity in the event HHPT is not available at [facility].   Follow Up Recommendations  Home health PT     Equipment Recommendations  Rolling walker with 5" wheels    Recommendations for Other Services       Precautions / Restrictions Precautions Precaution Comments: monitor o2 sats and RR,  4L HFNC,    Mobility  Bed Mobility               General bed mobility comments: in recliner  Transfers   Equipment used: Rolling walker (2 wheeled) Transfers: Sit to/from Stand   Stand pivot transfers: Supervision          Ambulation/Gait Ambulation/Gait assistance: Min guard Gait Distance (Feet): 100 Feet Assistive device: Rolling walker (2 wheeled) Gait Pattern/deviations: Step-through pattern;Decreased stride length Gait velocity: decreased   General Gait Details: slow but steady balance, Pt. on 4 l.   Stairs             Wheelchair Mobility    Modified Rankin (Stroke Patients Only)       Balance Overall balance assessment: Mild deficits observed, not formally tested                                          Cognition Arousal/Alertness: Awake/alert Behavior During Therapy: WFL for tasks assessed/performed Overall Cognitive Status: Within Functional Limits for tasks assessed                                         Exercises      General Comments        Pertinent Vitals/Pain Pain Assessment: No/denies pain    Home Living                      Prior Function            PT Goals (current goals can now be found in the care plan section) Progress towards PT goals: Progressing toward goals    Frequency    Min 3X/week      PT Plan Current plan remains appropriate    Co-evaluation              AM-PAC PT "6 Clicks" Mobility   Outcome Measure  Help needed turning from your back to your side while in a flat bed without using bedrails?: None Help needed moving from lying on your back to sitting on the side of a flat bed without using bedrails?: None Help needed moving to and from a bed to a chair (including a wheelchair)?: A Little Help needed standing up from a  chair using your arms (e.g., wheelchair or bedside chair)?: A Little Help needed to walk in hospital room?: A Little Help needed climbing 3-5 steps with a railing? : A Lot 6 Click Score: 19    End of Session Equipment Utilized During Treatment: Oxygen Activity Tolerance: Patient tolerated treatment well Patient left: in chair;with call bell/phone within reach;with nursing/sitter in room Nurse Communication: Mobility status PT Visit Diagnosis: Difficulty in walking, not elsewhere classified (R26.2)     Time: 1000-1025 PT Time Calculation (min) (ACUTE ONLY): 25 min  Charges:  $Gait Training: 23-37 mins                     Blanchard Kelch PT Acute Rehabilitation Services Pager [phone] Office [phone]    Rada Hay 06/08/2020, 11:06 AM

## 2020-06-08 NOTE — Progress Notes (Signed)
   06/08/20 1309  Assess: MEWS Score  Temp (!) 97.5 F (36.4 C)  BP 121/68  Pulse Rate (!) 111  Resp (!) 25  SpO2 94 %  Assess: MEWS Score  MEWS Temp 0  MEWS Systolic 0  MEWS Pulse 2  MEWS RR 1  MEWS LOC 0  MEWS Score 3  MEWS Score Color Yellow  Assess: if the MEWS score is Yellow or Red  Were vital signs taken at a resting state? Yes  Focused Assessment Change from prior assessment (see assessment flowsheet)  Early Detection of Sepsis Score *See Row Information* Low  MEWS guidelines implemented *See Row Information* Yes  Treat  MEWS Interventions Administered scheduled meds/treatments  Pain Scale 0-10  Pain Score 0  Pain Type Acute pain  Pain Location Throat  Pain Descriptors / Indicators Sore  Pain Frequency Intermittent  Pain Onset On-going  Patients Stated Pain Goal 0  Pain Intervention(s) Repositioned;Food;Other (Comment) (cold beverage)  Multiple Pain Sites No  Complains of Other (Comment) (sore throat)  Interventions Other (comment) (cold beverage)  Take Vital Signs  Increase Vital Sign Frequency  Yellow: Q 2hr X 2 then Q 4hr X 2, if remains yellow, continue Q 4hrs  Escalate  MEWS: Escalate Yellow: discuss with charge nurse/RN and consider discussing with provider and RRT  Notify: Charge Nurse/RN  Name of Charge Nurse/RN Notified Chastity RN  Date Charge Nurse/RN Notified 06/08/20  Time Charge Nurse/RN Notified 1319  Notify: Provider  Provider Name/Title Elgergawy  Date Provider Notified 06/08/20  Time Provider Notified 1319  Notification Type  (Amion)  Notification Reason Other (Comment) (Waiting on provider's response)  Response Other (Comment) (Waiting on providers response)  Date of Provider Response 06/08/20  Document  Patient Outcome Other (Comment);Stabilized after interventions  Progress note created (see row info) Yes

## 2020-06-08 NOTE — Progress Notes (Signed)
SATURATION QUALIFICATIONS: (This note is used to comply with regulatory documentation for home oxygen)  Patient Saturations on Room Air at Rest = 90%  Patient Saturations on Room Air while Ambulating = 86%  Patient Saturations on 4 Liters of oxygen while Ambulating = 87%  Please briefly explain why patient needs home oxygen: Patient will need oxygen for home to keep sats above 90%, shortness of breath noted while patient ambulating.

## 2020-06-08 NOTE — Progress Notes (Signed)
PROGRESS NOTE  Diane Padilla  QVZ:[id number] DOB: [birth year] DOA: 05/20/2020 PCP: [facility]   Brief Narrative:  Diane Padilla is a 34 y.o. female with a history of T2DM, OSA, PCOS, and obesity who presented with shortness of breath found to have covid-19 pneumonia requiring 6L supplemental oxygen which worsened to heated high flow requirement. She was admitted to the ICU/SDU and started on steroids. After initially declining other therapies, remdesivir and baricitinib were started after continued discussions and worsening clinical status. Lower extremity venous U/S revealed bilateral acute DVTs for which therapeutic dose anticoagulation was started on 11/30. Hypoxia has remained severe. Antibiotics empirically added 12/3 and course completed. As of 12/8, hypoxia has improved no longer requiring heated high flow oxygen and patient was transferred to medical floor.  Subjective:  She tolerated some activity with PT/OT today on 4 L nasal cannula, heart rate was in the 130s.  Assessment & Plan: Active Problems:   Type 2 diabetes mellitus with hyperglycemia, without long-term current use of insulin (HCC)   Class 3 severe obesity with serious comorbidity and body mass index (BMI) of 50.0 to 59.9 in adult [facility])   Pneumonia due to COVID-19 virus   Acute hypoxemic respiratory failure due to COVID-19 [facility])   Hyponatremia   Acute respiratory failure with hypoxia (HCC)   Acute deep vein thrombosis (DVT) of distal vein of lower extremity (HCC)  Acute hypoxemic respiratory failure due to covid-19 pneumonia with concern for superimposed bacterial pneumonia:  - SARS-CoV-2 PCR positive initially on 05/14/2020, off isolation as of 06/05/2020.   - Continue OOB, IS; making gains with PT. - S/p remdesivir x5 days (11/28 - 12/2), s/p baricitinib (11/27 - 12/10), s/p 7 days abx for possible HCAP on 12/9.  -She is currently off steroids. -[place] has been gradually  improving, she is tolerating 4 L nasal cannula with activity.  Sinus tachycardia: -  Persistent.  This is most likely due to deconditioning and possible automatic dysfunction secondary to Covid  -  TSH wnl. Echocardiogram 12/12 was normal. - Continue metoprolol 100mg  po BID. Improving.  Transaminitis  -Secondary to Covid, with underlying hepatic steatosis, ultrasound significant for steatosis, otherwise there is no acute finding, negative hepatitis panel . -LFTs continue to trend up, will need to continue monitoring closely and avoid hepatotoxic medications.    Acute bilateral peroneal vein DVTs: CTA chest showed no PE. -[place] for anticoagulation, will transition to Eliquis.  .   T2DM with steroid-induced hyperglycemia: HbA1c 6.6%.  - Continue tapered insulin, DC basal insulin now that steroids stopped - Linagliptin, continue  Thrush:  - Improved, continue po nystatin QID  Morbid obesity: Estimated body mass index is 48.9 kg/m as calculated from the following:   Height as of this encounter: 5\' 5"  (1.651 m).   Weight as of this encounter: 133.3 kg.  DVT prophylaxis: Lovenox Code Status: Full Family Communication: None at bedside Disposition Plan:  Status is: Inpatient   Remains inpatient appropriate because:Inpatient level of care appropriate due to severity of illness  Dispo: The patient is from: Home              Anticipated d/c is to: Home              Anticipated d/c date is: 2 days              Patient currently is not medically stable to d/c.  Consultants:   PCCM  Procedures:   None  Antimicrobials:  Remdesivir 11/28 - 12/2   Vancomycin 12/3  Cefepime 12/3 - 12/10    Objective: Vitals:   06/07/20 2255 06/08/20 0055 06/08/20 0340 06/08/20 1309  BP: 112/61  (!) 110/57 121/68  Pulse: (!) 109 92 92 (!) 111  Resp: 20  (!) 21 (!) 25  Temp: 98.9 F (37.2 C)  98.5 F (36.9 C) (!) 97.5 F (36.4 C)  TempSrc:    Oral  SpO2:   90% 94%  Weight:       Height:        Intake/Output Summary (Last 24 hours) at 06/08/2020 1357 Last data filed at 06/08/2020 0930 Gross per 24 hour  Intake 476 ml  Output --  Net 476 ml   Filed Weights   05/20/20 2118 05/31/20 1500 06/01/20 0503  Weight: (!) 139.2 kg 131.7 kg 133.3 kg    Awake Alert, Oriented X 3, No new F.N deficits, Normal affect Symmetrical Chest wall movement, Good air movement bilaterally, CTAB RRR,No Gallops,Rubs or new Murmurs, No Parasternal Heave +ve B.Sounds, Abd Soft, No tenderness, No rebound - guarding or rigidity. No Cyanosis, Clubbing or edema, No new Rash or bruise    Data Reviewed: I have personally reviewed following labs and imaging studies  CBC: Recent Labs  Lab 06/03/20 0327 06/04/20 0258  WBC 21.5* 18.8*  HGB 11.6* 11.2*  HCT 35.7* 34.0*  MCV 83.4 83.3  PLT 423* 423*   Basic Metabolic Panel: Recent Labs  Lab 06/04/20 0258 06/05/20 0409 06/06/20 0402 06/07/20 0352 06/08/20 0412  NA 135 135 136 136 136  K 3.9 3.9 4.1 4.0 4.1  CL 99 99 101 102 99  CO2 26 26 26 24 26   GLUCOSE 166* 87 94 131* 137*  BUN 19 16 16 20 15   CREATININE 0.77 0.66 0.64 0.67 0.62  CALCIUM 9.2 8.9 8.7* 9.2 8.9   GFR: Estimated Creatinine Clearance: 134.3 mL/min (by C-G formula based on SCr of 0.62 mg/dL). Liver Function Tests: Recent Labs  Lab 06/04/20 0258 06/05/20 0409 06/06/20 0402 06/07/20 0352 06/08/20 0412  AST 106* 144* 147* 161* 166*  ALT 474* 539* 567* 628* 656*  ALKPHOS 94 82 82 89 95  BILITOT 0.5 0.3 0.4 0.3 0.3  PROT 7.0 6.9 6.8 6.7 7.2  ALBUMIN 3.0* 3.0* 3.0* 3.1* 3.3*   CBG: Recent Labs  Lab 06/07/20 2029 06/07/20 2302 06/08/20 0336 06/08/20 0751 06/08/20 1114  GLUCAP 110* 135* 144* 125* 162*   Anemia Panel: No results for input(s): VITAMINB12, FOLATE, FERRITIN, TIBC, IRON, RETICCTPCT in the last 72 hours. Urine analysis:    Component Value Date/Time   BILIRUBINUR negative 08/19/2018 1543   PROTEINUR Negative 08/19/2018 1543    UROBILINOGEN 0.2 08/19/2018 1543   NITRITE negative 08/19/2018 1543   LEUKOCYTESUR Moderate (2+) (A) 08/19/2018 1543   No results found for this or any previous visit (from the past 240 hour(s)).    Radiology Studies: No results found.  Scheduled Meds: . (feeding supplement) PROSource Plus  30 mL Oral TID BM  . albuterol  2 puff Inhalation QID  . amLODipine  5 mg Oral Daily  . Chlorhexidine Gluconate Cloth  6 each Topical Q0600  . enoxaparin (LOVENOX) injection  130 mg Subcutaneous BID  . feeding supplement (NEPRO CARB STEADY)  237 mL Oral TID BM  . insulin aspart  0-20 Units Subcutaneous TID WC  . insulin aspart  0-5 Units Subcutaneous QHS  . insulin aspart  6 Units Subcutaneous TID WC  . linagliptin  5 mg Oral  Daily  . mouth rinse  15 mL Mouth Rinse BID  . melatonin  5 mg Oral QHS  . metoprolol tartrate  100 mg Oral BID  . multivitamin with minerals  1 tablet Oral Daily  . nystatin  5 mL Oral QID  . saccharomyces boulardii  250 mg Oral BID  . sodium chloride flush  10-40 mL Intracatheter Q12H   Continuous Infusions:    LOS: 19 days    Huey Bienenstock, MD [facility] [email or web address] 06/08/2020, 1:57 PM

## 2020-06-09 LAB — COMPREHENSIVE METABOLIC PANEL
ALT: 647 U/L — ABNORMAL HIGH (ref 0–44)
AST: 159 U/L — ABNORMAL HIGH (ref 15–41)
Albumin: 3.6 g/dL (ref 3.5–5.0)
Alkaline Phosphatase: 102 U/L (ref 38–126)
Anion gap: 11 (ref 5–15)
BUN: 10 mg/dL (ref 6–20)
CO2: 23 mmol/L (ref 22–32)
Calcium: 9.2 mg/dL (ref 8.9–10.3)
Chloride: 102 mmol/L (ref 98–111)
Creatinine, Ser: 0.74 mg/dL (ref 0.44–1.00)
GFR, Estimated: >60 mL/min (ref >60)
Glucose, Bld: 132 mg/dL — ABNORMAL HIGH (ref 70–99)
Potassium: 4.5 mmol/L (ref 3.5–5.1)
Sodium: 136 mmol/L (ref 135–145)
Total Bilirubin: 0.5 mg/dL (ref 0.3–1.2)
Total Protein: 7.9 g/dL (ref 6.5–8.1)

## 2020-06-09 LAB — CBC
HCT: 37.6 % (ref 36.0–46.0)
Hemoglobin: 12.2 g/dL (ref 12.0–15.0)
MCH: 27.6 pg (ref 26.0–34.0)
MCHC: 32.4 g/dL (ref 30.0–36.0)
MCV: 85.1 fL (ref 80.0–100.0)
Platelets: 345 10*3/uL (ref 150–400)
RBC: 4.42 MIL/uL (ref 3.87–5.11)
RDW: 15.9 % — ABNORMAL HIGH (ref 11.5–15.5)
WBC: 14.4 10*3/uL — ABNORMAL HIGH (ref 4.0–10.5)
nRBC: 0.1 % (ref 0.0–0.2)

## 2020-06-09 LAB — D-DIMER, QUANTITATIVE: D-Dimer, Quant: 2.69 ug/mL-FEU — ABNORMAL HIGH (ref 0.00–0.50)

## 2020-06-09 LAB — GLUCOSE, CAPILLARY
Glucose-Capillary: 128 mg/dL — ABNORMAL HIGH (ref 70–99)
Glucose-Capillary: 132 mg/dL — ABNORMAL HIGH (ref 70–99)
Glucose-Capillary: 142 mg/dL — ABNORMAL HIGH (ref 70–99)
Glucose-Capillary: 172 mg/dL — ABNORMAL HIGH (ref 70–99)
Glucose-Capillary: 119 mg/dL — ABNORMAL HIGH (ref 70–99)

## 2020-06-09 LAB — C-REACTIVE PROTEIN: CRP: 1.1 mg/dL — ABNORMAL HIGH (ref <1.0)

## 2020-06-09 NOTE — Progress Notes (Signed)
Physical Therapy Treatment Patient Details Name: Diane Padilla MRN: [id number] DOB: 1986-04-11 Today's Date: 06/09/2020    History of Present Illness Diane Padilla is an 34 y.o. female past medical history of diabetes mellitus type 2 obstructive sleep apnea obesity who presents with shortness of breath was found to be COVID-19 positive, bilateral DVT's.    PT Comments    Patient is progressing well. Ambulated x 100' on 4 L [place]. Spo2 94%. HR max 124. Patient provided HEP , information on Perceived exertion to guide her activity, energy conservation. Spouse present.Continue progressive activity, monitor for oxygen saturation.   Follow Up Recommendations  Home health PT     Equipment Recommendations  Rolling walker with 5" wheels;3in1 (PT)    Recommendations for Other Services       Precautions / Restrictions Precautions Precaution Comments: monitor o2 sats and RR,  4L HFNC,    Mobility  Bed Mobility               General bed mobility comments: in recliner  Transfers   Equipment used: None Transfers: Sit to/from Stand Sit to Stand: Supervision         General transfer comment: utilizes arm rests to power up to standing, rail on wall  Ambulation/Gait Ambulation/Gait assistance: Min guard;Supervision Gait Distance (Feet):  (then 100' with RW) Assistive device: None Gait Pattern/deviations: Step-through pattern Gait velocity: decreased   General Gait Details: slow  to ambulate to BR and back without Ad. Ambulated in hall using Rw and  close supervison   Stairs             Wheelchair Mobility    Modified Rankin (Stroke Patients Only)       Balance Overall balance assessment: Mild deficits observed, not formally tested                                          Cognition Arousal/Alertness: Awake/alert                                            Exercises Other Exercises Other Exercises: sit to stand  x5 Other Exercises: provided HEP and reviewed standing exercises but did not perform  to amb.    General Comments        Pertinent Vitals/Pain Pain Assessment: No/denies pain    Home Living                      Prior Function            PT Goals (current goals can now be found in the care plan section) Progress towards PT goals: Progressing toward goals    Frequency    Min 3X/week      PT Plan Current plan remains appropriate    Co-evaluation              AM-PAC PT "6 Clicks" Mobility   Outcome Measure  Help needed turning from your back to your side while in a flat bed without using bedrails?: None Help needed moving from lying on your back to sitting on the side of a flat bed without using bedrails?: None Help needed moving to and from a bed to a chair (including a wheelchair)?: A Little Help needed standing up  from a chair using your arms (e.g., wheelchair or bedside chair)?: A Little Help needed to walk in hospital room?: A Little Help needed climbing 3-5 steps with a railing? : A Lot 6 Click Score: 19    End of Session Equipment Utilized During Treatment: Oxygen Activity Tolerance: Patient tolerated treatment well Patient left: in chair;with call bell/phone within reach;with family/visitor present Nurse Communication: Mobility status PT Visit Diagnosis: Difficulty in walking, not elsewhere classified (R26.2)     Time: 6389-3734 PT Time Calculation (min) (ACUTE ONLY): 29 min  Charges:  $Gait Training: 8-22 mins $Self Care/Home Management: 8-22                     Blanchard Kelch PT Acute Rehabilitation Services Pager [phone] Office [phone]    Rada Hay 06/09/2020, 4:47 PM

## 2020-06-09 NOTE — Progress Notes (Signed)
PROGRESS NOTE  ANALIZ TVEDT  RFX:[id number] DOB: 1986-06-15 DOA: 05/20/2020 PCP: [facility]   Brief Narrative:  Diane Padilla is a 34 y.o. female with a history of T2DM, OSA, PCOS, and obesity who presented with shortness of breath found to have covid-19 pneumonia requiring 6L supplemental oxygen which worsened to heated high flow requirement. She was admitted to the ICU/SDU and started on steroids. After initially declining other therapies, remdesivir and baricitinib were started after continued discussions and worsening clinical status. Lower extremity venous U/S revealed bilateral acute DVTs for which therapeutic dose anticoagulation was started on 11/30. Hypoxia has remained severe. Antibiotics empirically added 12/3 and course completed. As of 12/8, hypoxia has improved no longer requiring heated high flow oxygen and patient was transferred to medical floor.  Subjective:  Report improved tolerance to physical activity mild reports dyspnea minimal with activity at this point.    Assessment & Plan: Active Problems:   Type 2 diabetes mellitus with hyperglycemia, without long-term current use of insulin (HCC)   Class 3 severe obesity with serious comorbidity and body mass index (BMI) of 50.0 to 59.9 in adult [facility])   Pneumonia due to COVID-19 virus   Acute hypoxemic respiratory failure due to COVID-19 [facility])   Hyponatremia   Acute respiratory failure with hypoxia (HCC)   Acute deep vein thrombosis (DVT) of distal vein of lower extremity (HCC)  Acute hypoxemic respiratory failure due to covid-19 pneumonia with concern for superimposed bacterial pneumonia:  - SARS-CoV-2 PCR positive initially on 05/14/2020, off isolation as of 06/05/2020.   - Continue OOB, IS; making gains with PT. - S/p remdesivir x5 days (11/28 - 12/2), s/p baricitinib (11/27 - 12/10), s/p 7 days abx for possible HCAP on 12/9.  -She is currently off steroids. -Her oxygen  requirement gradually improving, she is currently tolerating some activity on 4 L high flow nasal cannula.  Sinus tachycardia: -  Persistent.  This is most likely due to deconditioning and possible automatic dysfunction secondary to Covid  -  TSH wnl. Echocardiogram 12/12 was normal. - Continue metoprolol 100mg  po BID. Improving.  Transaminitis  -Secondary to Covid, with underlying hepatic steatosis, ultrasound significant for steatosis, otherwise there is no acute finding, negative hepatitis panel . -If things remain significantly elevated, but it does appear to be plateauing today, continue to monitor closely, avoid nephrotoxic medications .  Acute bilateral peroneal vein DVTs: CTA chest showed no PE. -Lovenox for anticoagulation, discussed with pharmacy, at this point is not recommended to start on DOAC in the setting of her elevated LFTs, will await tell her her enzymes trending down before initiating Eliquis.  T2DM with steroid-induced hyperglycemia: HbA1c 6.6%.  - Continue tapered insulin, DC basal insulin now that steroids stopped - Linagliptin, continue  Thrush:  - Improved, continue po nystatin QID  Morbid obesity: Estimated body mass index is 48.9 kg/m as calculated from the following:   Height as of this encounter: 5\' 5"  (1.651 m).   Weight as of this encounter: 133.3 kg.  DVT prophylaxis: Lovenox Code Status: Full Family Communication: None at bedside Disposition Plan:  Status is: Inpatient   Remains inpatient appropriate because:Inpatient level of care appropriate due to severity of illness  Dispo: The patient is from: Home              Anticipated d/c is to: Home              Anticipated d/c date is: 2 days  Patient currently is not medically stable to d/c.  Consultants:   PCCM  Procedures:   None  Antimicrobials:  Remdesivir 11/28 - 12/2   Vancomycin 12/3  Cefepime 12/3 - 12/10    Objective: Vitals:   06/09/20 0030 06/09/20 0031  06/09/20 0400 06/09/20 0408  BP: (!) 116/53  (!) 99/59 (!) 163/80  Pulse: (!) 101  100 81  Resp: 18  18 18   Temp: 98.1 F (36.7 C)  98.1 F (36.7 C) 98 F (36.7 C)  TempSrc:      SpO2: (!) 66% 90% (!) 83% 98%  Weight:      Height:        Intake/Output Summary (Last 24 hours) at 06/09/2020 1448 Last data filed at 06/09/2020 0827 Gross per 24 hour  Intake 354 ml  Output --  Net 354 ml   Filed Weights   05/20/20 2118 05/31/20 1500 06/01/20 0503  Weight: (!) 139.2 kg 131.7 kg 133.3 kg    Awake Alert, Oriented X 3, No new F.N deficits, Normal affect Symmetrical Chest wall movement, Good air movement bilaterally, CTAB RRR,No Gallops,Rubs or new Murmurs, No Parasternal Heave +ve B.Sounds, Abd Soft, No tenderness, No rebound - guarding or rigidity. No Cyanosis, Clubbing or edema, No new Rash or bruise     Data Reviewed: I have personally reviewed following labs and imaging studies  CBC: Recent Labs  Lab 06/03/20 0327 06/04/20 0258 06/09/20 0419  WBC 21.5* 18.8* 14.4*  HGB 11.6* 11.2* 12.2  HCT 35.7* 34.0* 37.6  MCV 83.4 83.3 85.1  PLT 423* 423* 345   Basic Metabolic Panel: Recent Labs  Lab 06/05/20 0409 06/06/20 0402 06/07/20 0352 06/08/20 0412 06/09/20 0419  NA 135 136 136 136 136  K 3.9 4.1 4.0 4.1 4.5  CL 99 101 102 99 102  CO2 26 26 24 26 23   GLUCOSE 87 94 131* 137* 132*  BUN 16 16 20 15 10   CREATININE 0.66 0.64 0.67 0.62 0.74  CALCIUM 8.9 8.7* 9.2 8.9 9.2   GFR: Estimated Creatinine Clearance: 134.3 mL/min (by C-G formula based on SCr of 0.74 mg/dL). Liver Function Tests: Recent Labs  Lab 06/05/20 0409 06/06/20 0402 06/07/20 0352 06/08/20 0412 06/09/20 0419  AST 144* 147* 161* 166* 159*  ALT 539* 567* 628* 656* 647*  ALKPHOS 82 82 89 95 102  BILITOT 0.3 0.4 0.3 0.3 0.5  PROT 6.9 6.8 6.7 7.2 7.9  ALBUMIN 3.0* 3.0* 3.1* 3.3* 3.6   CBG: Recent Labs  Lab 06/08/20 2000 06/08/20 2351 06/09/20 0401 06/09/20 0812 06/09/20 1154  GLUCAP 124*  120* 128* 132* 142*   Anemia Panel: No results for input(s): VITAMINB12, FOLATE, FERRITIN, TIBC, IRON, RETICCTPCT in the last 72 hours. Urine analysis:    Component Value Date/Time   BILIRUBINUR negative 08/19/2018 1543   PROTEINUR Negative 08/19/2018 1543   UROBILINOGEN 0.2 08/19/2018 1543   NITRITE negative 08/19/2018 1543   LEUKOCYTESUR Moderate (2+) (A) 08/19/2018 1543   No results found for this or any previous visit (from the past 240 hour(s)).    Radiology Studies: No results found.  Scheduled Meds: . (feeding supplement) PROSource Plus  30 mL Oral TID BM  . albuterol  2 puff Inhalation QID  . Chlorhexidine Gluconate Cloth  6 each Topical Q0600  . enoxaparin (LOVENOX) injection  130 mg Subcutaneous BID  . feeding supplement (NEPRO CARB STEADY)  237 mL Oral TID BM  . insulin aspart  0-20 Units Subcutaneous TID WC  . insulin aspart  0-5 Units Subcutaneous QHS  . insulin aspart  4 Units Subcutaneous TID WC  . linagliptin  5 mg Oral Daily  . mouth rinse  15 mL Mouth Rinse BID  . melatonin  5 mg Oral QHS  . metoprolol tartrate  100 mg Oral BID  . multivitamin with minerals  1 tablet Oral Daily  . nystatin  5 mL Oral QID  . saccharomyces boulardii  250 mg Oral BID  . sodium chloride flush  10-40 mL Intracatheter Q12H   Continuous Infusions:    LOS: 20 days    Huey Bienenstock, MD [facility] [email or web address] 06/09/2020, 2:48 PM

## 2020-06-09 NOTE — Progress Notes (Signed)
ANTICOAGULATION CONSULT NOTE - Follow Up Consult  Pharmacy Consult for Lovenox Indication: DVT  No Known Allergies  Patient Measurements: Height: 5\' 5"  (165.1 cm) Weight: 133.3 kg (293 lb 14 oz) IBW/kg (Calculated) : 57  Vital Signs: Temp: 98 F (36.7 C) (12/16 0408) BP: 163/80 (12/16 0408) Pulse Rate: 81 (12/16 0408)  Labs: Recent Labs    06/07/20 0352 06/08/20 0412 06/09/20 0419  HGB  --   --  12.2  HCT  --   --  37.6  PLT  --   --  345  CREATININE 0.67 0.62 0.74    Estimated Creatinine Clearance: 134.3 mL/min (by C-G formula based on SCr of 0.74 mg/dL).   Medications:  Scheduled:  . (feeding supplement) PROSource Plus  30 mL Oral TID BM  . albuterol  2 puff Inhalation QID  . Chlorhexidine Gluconate Cloth  6 each Topical Q0600  . enoxaparin (LOVENOX) injection  130 mg Subcutaneous BID  . feeding supplement (NEPRO CARB STEADY)  237 mL Oral TID BM  . insulin aspart  0-20 Units Subcutaneous TID WC  . insulin aspart  0-5 Units Subcutaneous QHS  . insulin aspart  4 Units Subcutaneous TID WC  . linagliptin  5 mg Oral Daily  . mouth rinse  15 mL Mouth Rinse BID  . melatonin  5 mg Oral QHS  . metoprolol tartrate  100 mg Oral BID  . multivitamin with minerals  1 tablet Oral Daily  . nystatin  5 mL Oral QID  . saccharomyces boulardii  250 mg Oral BID  . sodium chloride flush  10-40 mL Intracatheter Q12H   Infusions:   PRN: acetaminophen, chlorpheniramine-HYDROcodone, guaiFENesin-dextromethorphan, lip balm, ondansetron **OR** ondansetron (ZOFRAN) IV, phenol, sodium chloride, sodium chloride flush  Assessment: Pt is a 34 year old female admitted with respiratory failure due to COVID-19 PNA. 11/30: Venous doppler: + Bilateral DVT,  12/5: 14/5 for PE. Pharmacy consulted for anticoagulation.     SCr stable, CrCl > 100 ml/min  CBC: H/H stable, Plts wnl  No bleeding reported  D-dimer trending down, 2.69 today  Note LFTs remain elevated, holding off on PO  anticoagulation for now  Goal of Therapy:  Anti-Xa level 0.6-1 units/ml 4hrs after LMWH dose given Monitor platelets by anticoagulation protocol: Yes   Plan:  Continue Lovenox 1mg /kg SQ q12h No dose adjustments needed, pharmacy will sign off  UEA:[email or web address], PharmD, BCPS Pharmacy: [phone] 06/09/2020,11:57 AM

## 2020-06-10 LAB — CBC
HCT: 33.9 % — ABNORMAL LOW (ref 36.0–46.0)
Hemoglobin: 10.8 g/dL — ABNORMAL LOW (ref 12.0–15.0)
MCH: 27.6 pg (ref 26.0–34.0)
MCHC: 31.9 g/dL (ref 30.0–36.0)
MCV: 86.7 fL (ref 80.0–100.0)
Platelets: 309 10*3/uL (ref 150–400)
RBC: 3.91 MIL/uL (ref 3.87–5.11)
RDW: 16.3 % — ABNORMAL HIGH (ref 11.5–15.5)
WBC: 14 10*3/uL — ABNORMAL HIGH (ref 4.0–10.5)
nRBC: 0.3 % — ABNORMAL HIGH (ref 0.0–0.2)

## 2020-06-10 LAB — GLUCOSE, CAPILLARY
Glucose-Capillary: 105 mg/dL — ABNORMAL HIGH (ref 70–99)
Glucose-Capillary: 107 mg/dL — ABNORMAL HIGH (ref 70–99)
Glucose-Capillary: 118 mg/dL — ABNORMAL HIGH (ref 70–99)
Glucose-Capillary: 127 mg/dL — ABNORMAL HIGH (ref 70–99)
Glucose-Capillary: 128 mg/dL — ABNORMAL HIGH (ref 70–99)
Glucose-Capillary: 153 mg/dL — ABNORMAL HIGH (ref 70–99)

## 2020-06-10 LAB — COMPREHENSIVE METABOLIC PANEL
ALT: 556 U/L — ABNORMAL HIGH (ref 0–44)
AST: 127 U/L — ABNORMAL HIGH (ref 15–41)
Albumin: 3.2 g/dL — ABNORMAL LOW (ref 3.5–5.0)
Alkaline Phosphatase: 91 U/L (ref 38–126)
Anion gap: 11 (ref 5–15)
BUN: 13 mg/dL (ref 6–20)
CO2: 24 mmol/L (ref 22–32)
Calcium: 8.8 mg/dL — ABNORMAL LOW (ref 8.9–10.3)
Chloride: 103 mmol/L (ref 98–111)
Creatinine, Ser: 0.74 mg/dL (ref 0.44–1.00)
GFR, Estimated: >60 mL/min (ref >60)
Glucose, Bld: 130 mg/dL — ABNORMAL HIGH (ref 70–99)
Potassium: 4.2 mmol/L (ref 3.5–5.1)
Sodium: 138 mmol/L (ref 135–145)
Total Bilirubin: 0.2 mg/dL — ABNORMAL LOW (ref 0.3–1.2)
Total Protein: 6.9 g/dL (ref 6.5–8.1)

## 2020-06-10 MED ORDER — IBUPROFEN 200 MG PO TABS
600.0000 mg | ORAL_TABLET | Freq: Once | ORAL | Status: AC
  Administered 2020-06-10: 600 mg via ORAL
  Filled 2020-06-10: qty 3

## 2020-06-10 MED ORDER — PANTOPRAZOLE SODIUM 40 MG PO TBEC
40.0000 mg | DELAYED_RELEASE_TABLET | Freq: Every day | ORAL | Status: DC
  Administered 2020-06-11: 40 mg via ORAL
  Filled 2020-06-10: qty 1

## 2020-06-10 MED ORDER — IBUPROFEN 200 MG PO TABS
200.0000 mg | ORAL_TABLET | Freq: Four times a day (QID) | ORAL | Status: DC | PRN
Start: 2020-06-10 — End: 2020-06-12

## 2020-06-10 NOTE — Progress Notes (Signed)
PROGRESS NOTE  Diane Padilla  XFG:[id number] DOB: [birth year] DOA: 05/20/2020 PCP: [facility]   Brief Narrative:  Diane Padilla is a 34 y.o. female with a history of T2DM, OSA, PCOS, and obesity who presented with shortness of breath found to have covid-19 pneumonia requiring 6L supplemental oxygen which worsened to heated high flow requirement. She was admitted to the ICU/SDU and started on steroids. After initially declining other therapies, remdesivir and baricitinib were started after continued discussions and worsening clinical status. Lower extremity venous U/S revealed bilateral acute DVTs for which therapeutic dose anticoagulation was started on 11/30. Hypoxia has remained severe. Antibiotics empirically added 12/3 and course completed. As of 12/8, hypoxia has improved no longer requiring heated high flow oxygen and patient was transferred to medical floor.  Subjective:  Reports minimal dyspnea, has better tolerance to activity.    Assessment & Plan: Active Problems:   Type 2 diabetes mellitus with hyperglycemia, without long-term current use of insulin (HCC)   Class 3 severe obesity with serious comorbidity and body mass index (BMI) of 50.0 to 59.9 in adult [facility])   Pneumonia due to COVID-19 virus   Acute hypoxemic respiratory failure due to COVID-19 [facility])   Hyponatremia   Acute respiratory failure with hypoxia (HCC)   Acute deep vein thrombosis (DVT) of distal vein of lower extremity (HCC)  Acute hypoxemic respiratory failure due to covid-19 pneumonia with concern for superimposed bacterial pneumonia:  - SARS-CoV-2 PCR positive initially on 05/14/2020, off isolation as of 06/05/2020.   - Continue OOB, IS; making gains with PT. - S/p remdesivir x5 days (11/28 - 12/2), s/p baricitinib (11/27 - 12/10), s/p 7 days abx for possible HCAP on 12/9.  -She is currently off steroids. -Her oxygen requirement gradually improving, she is currently  tolerating some activity on 4 L high flow nasal cannula.  Sinus tachycardia: -  Persistent.  This is most likely due to deconditioning and possible automatic dysfunction secondary to Covid rate remains in the 120s at rest, goes intermittently to 130s with activity, but overall has been showing improvement over last couple days. -  TSH wnl. Echocardiogram 12/12 was normal. - Continue metoprolol 100mg  po BID. Improving.  Transaminitis  -Secondary to Covid, with underlying hepatic steatosis, ultrasound significant for steatosis, otherwise there is no acute finding, negative hepatitis panel . -AST is minimally elevated, peaked at 656, but it is finally trending down this morning at 556, avoid hepatotoxic medications, continue to monitor closely , I have discussed with her, hopefully she can be discharged in 1 to 2 days along her LFTs keep trending down .  Acute bilateral peroneal vein DVTs:  - CTA chest showed no PE. -Lovenox for anticoagulation, to transition to Eliquis has been held currently given elevated LFTs, but now they have been trending down likely she can be transitioned to Eliquis in the next 24 to 48 hours every continue downward trend .  T2DM with steroid-induced hyperglycemia: HbA1c 6.6%.  - Continue tapered insulin, DC basal insulin now that steroids stopped - Linagliptin, continue  Thrush:  - Improved, continue po nystatin QID  Morbid obesity: Estimated body mass index is 48.9 kg/m as calculated from the following:   Height as of this encounter: 5\' 5"  (1.651 m).   Weight as of this encounter: 133.3 kg.  DVT prophylaxis: Lovenox Code Status: Full Family Communication: None at bedside, discussed with husband by phone Disposition Plan:  Status is: Inpatient   Remains inpatient appropriate because:Inpatient level of  care appropriate due to severity of illness  Dispo: The patient is from: Home              Anticipated d/c is to: Home              Anticipated d/c date is:  2 days              Patient currently is not medically stable to d/c.  She can be discharged home in 1 to 2 days IF her LFTs continue trending down.  Consultants:   PCCM  Procedures:   None  Antimicrobials:  Remdesivir 11/28 - 12/2   Vancomycin 12/3  Cefepime 12/3 - 12/10    Objective: Vitals:   06/09/20 0408 06/09/20 1405 06/09/20 2057 06/10/20 0531  BP: (!) 163/80 137/79 125/67 110/61  Pulse: 81 94 100 97  Resp: 18 16 16 17   Temp: 98 F (36.7 C) 98.1 F (36.7 C) 99 F (37.2 C) 98.5 F (36.9 C)  TempSrc:  Oral Oral Oral  SpO2: 98% 96% 98% 96%  Weight:      Height:        Intake/Output Summary (Last 24 hours) at 06/10/2020 1259 Last data filed at 06/09/2020 1840 Gross per 24 hour  Intake 708 ml  Output --  Net 708 ml   Filed Weights   05/20/20 2118 05/31/20 1500 06/01/20 0503  Weight: (!) 139.2 kg 131.7 kg 133.3 kg    Awake Alert, Oriented X 3, No new F.N deficits, Normal affect Symmetrical Chest wall movement, Good air movement bilaterally, CTAB RRR +ve B.Sounds, Abd Soft, No tenderness, No rebound - guarding or rigidity. No Cyanosis, Clubbing or edema, No new Rash or bruise      Data Reviewed: I have personally reviewed following labs and imaging studies  CBC: Recent Labs  Lab 06/04/20 0258 06/09/20 0419 06/10/20 0419  WBC 18.8* 14.4* 14.0*  HGB 11.2* 12.2 10.8*  HCT 34.0* 37.6 33.9*  MCV 83.3 85.1 86.7  PLT 423* 345 309   Basic Metabolic Panel: Recent Labs  Lab 06/06/20 0402 06/07/20 0352 06/08/20 0412 06/09/20 0419 06/10/20 0419  NA 136 136 136 136 138  K 4.1 4.0 4.1 4.5 4.2  CL 101 102 99 102 103  CO2 26 24 26 23 24   GLUCOSE 94 131* 137* 132* 130*  BUN 16 20 15 10 13   CREATININE 0.64 0.67 0.62 0.74 0.74  CALCIUM 8.7* 9.2 8.9 9.2 8.8*   GFR: Estimated Creatinine Clearance: 134.3 mL/min (by C-G formula based on SCr of 0.74 mg/dL). Liver Function Tests: Recent Labs  Lab 06/06/20 0402 06/07/20 0352 06/08/20 0412  06/09/20 0419 06/10/20 0419  AST 147* 161* 166* 159* 127*  ALT 567* 628* 656* 647* 556*  ALKPHOS 82 89 95 102 91  BILITOT 0.4 0.3 0.3 0.5 0.2*  PROT 6.8 6.7 7.2 7.9 6.9  ALBUMIN 3.0* 3.1* 3.3* 3.6 3.2*   CBG: Recent Labs  Lab 06/09/20 2059 06/10/20 0000 06/10/20 0534 06/10/20 0811 06/10/20 1109  GLUCAP 119* 105* 127* 153* 118*   Anemia Panel: No results for input(s): VITAMINB12, FOLATE, FERRITIN, TIBC, IRON, RETICCTPCT in the last 72 hours. Urine analysis:    Component Value Date/Time   BILIRUBINUR negative 08/19/2018 1543   PROTEINUR Negative 08/19/2018 1543   UROBILINOGEN 0.2 08/19/2018 1543   NITRITE negative 08/19/2018 1543   LEUKOCYTESUR Moderate (2+) (A) 08/19/2018 1543   No results found for this or any previous visit (from the past 240 hour(s)).    Radiology Studies:  No results found.  Scheduled Meds: . (feeding supplement) PROSource Plus  30 mL Oral TID BM  . albuterol  2 puff Inhalation QID  . Chlorhexidine Gluconate Cloth  6 each Topical Q0600  . enoxaparin (LOVENOX) injection  130 mg Subcutaneous BID  . feeding supplement (NEPRO CARB STEADY)  237 mL Oral TID BM  . insulin aspart  0-20 Units Subcutaneous TID WC  . insulin aspart  0-5 Units Subcutaneous QHS  . insulin aspart  4 Units Subcutaneous TID WC  . linagliptin  5 mg Oral Daily  . mouth rinse  15 mL Mouth Rinse BID  . melatonin  5 mg Oral QHS  . metoprolol tartrate  100 mg Oral BID  . multivitamin with minerals  1 tablet Oral Daily  . nystatin  5 mL Oral QID  . saccharomyces boulardii  250 mg Oral BID  . sodium chloride flush  10-40 mL Intracatheter Q12H   Continuous Infusions:    LOS: 21 days    Huey Bienenstock, MD [facility] [email or web address] 06/10/2020, 12:59 PM

## 2020-06-10 NOTE — Progress Notes (Signed)
Occupational Therapy Treatment Patient Details Name: Diane Padilla MRN: [id number] DOB: 06-01-1986 Today's Date: 06/10/2020    History of present illness Diane Padilla is an 34 y.o. female past medical history of diabetes mellitus type 2 obstructive sleep apnea obesity who presents with shortness of breath was found to be COVID-19 positive, bilateral DVT's.   OT comments  Treatment focused on improving activity tolerance via ambulation, education on energy conservation principles and reiterating breathing techniques to improve lung capacity. Patient verbalized understanding of all education and instruction - making notes in her self phone and downloading suggested app for visual breathing cue. Reiterated use of shower chair at home for safety. Patient ambulated 100 feet in hallway with RW today on 4L stopping briefly several times to work on diaphragmatic breathing and slowing respirations. Patient's o2 sat in the 90s when she returned to the room.    Follow Up Recommendations  Home health OT    Equipment Recommendations  Tub/shower seat    Recommendations for Other Services      Precautions / Restrictions Precautions Precautions: None Precaution Comments: monitor o2 sats and RR,  4L HFNC, Restrictions Weight Bearing Restrictions: No       Mobility Bed Mobility               General bed mobility comments: in recliner  Transfers Overall transfer level: Needs assistance Equipment used: Rolling walker (2 wheeled)   Sit to Stand: Supervision Stand pivot transfers: Supervision       General transfer comment: supervision to ambulate 100 feet in hall with walker on 4L. O2 sats maintained above 90%.    Balance Overall balance assessment: Mild deficits observed, not formally tested                                         ADL either performed or assessed with clinical judgement   ADL                                          General ADL Comments: Patient reports showering with nursing staff today and standing at sink for grooming. Patient reports potentially being discharged soon. Patient educated on energy conservation principles with therapist recommending shower chair for safety.     Vision Baseline Vision/History: No visual deficits     Perception     Praxis      Cognition Arousal/Alertness: Awake/alert Behavior During Therapy: WFL for tasks assessed/performed Overall Cognitive Status: Within Functional Limits for tasks assessed                                          Exercises     Shoulder Instructions       General Comments      Pertinent Vitals/ Pain       Pain Assessment: No/denies pain  Home Living                                          Prior Functioning/Environment              Frequency  Min 2X/week  Progress Toward Goals  OT Goals(current goals can now be found in the care plan section)  Progress towards OT goals: Progressing toward goals  Acute Rehab OT Goals Patient Stated Goal: to go home and return to work OT Goal Formulation: With patient Time For Goal Achievement: 06/12/20 Potential to Achieve Goals: Good  Plan Discharge plan remains appropriate    Co-evaluation          OT goals addressed during session: ADL's and self-care (activity tolerance)      AM-PAC OT "6 Clicks" Daily Activity     Outcome Measure   Help from another person eating meals?: None Help from another person taking care of personal grooming?: A Little Help from another person toileting, which includes using toliet, bedpan, or urinal?: A Little Help from another person bathing (including washing, rinsing, drying)?: A Little Help from another person to put on and taking off regular upper body clothing?: A Little Help from another person to put on and taking off regular lower body clothing?: A Little 6 Click Score: 19    End of  Session Equipment Utilized During Treatment: Rolling walker;Oxygen  OT Visit Diagnosis: Muscle weakness (generalized) (M62.81)   Activity Tolerance Patient tolerated treatment well   Patient Left in chair;with call bell/phone within reach   Nurse Communication  (okay to see)        Time: 1450-1511 OT Time Calculation (min): 21 min  Charges: OT General Charges $OT Visit: 1 Visit OT Treatments $Therapeutic Activity: 8-22 mins  Baden Betsch, OTR/L Acute Care Rehab Services  Office [phone] Pager: [phone]    Kelli Churn 06/10/2020, 3:50 PM

## 2020-06-10 NOTE — TOC Benefit Eligibility Note (Signed)
Transition of Care [facility]) Benefit Eligibility Note    Patient Details  Name: Diane Padilla MRN: [id number] Date of Birth: May 13, 1986   Medication/Dose: Eliquis 16m bid x 30 daysa  Covered?: Yes  Tier: 3 Drug  Prescription Coverage Preferred Pharmacy: local  Spoke with Person/Company/Phone Number:: [employer] [phone] Co-Pay: $0  Prior Approval: No  Deductible: Met       FKerin SalenPhone Number: 06/10/2020, 10:39 AM

## 2020-06-11 LAB — CBC
HCT: 32.4 % — ABNORMAL LOW (ref 36.0–46.0)
Hemoglobin: 10.4 g/dL — ABNORMAL LOW (ref 12.0–15.0)
MCH: 27.7 pg (ref 26.0–34.0)
MCHC: 32.1 g/dL (ref 30.0–36.0)
MCV: 86.2 fL (ref 80.0–100.0)
Platelets: 297 10*3/uL (ref 150–400)
RBC: 3.76 MIL/uL — ABNORMAL LOW (ref 3.87–5.11)
RDW: 16.4 % — ABNORMAL HIGH (ref 11.5–15.5)
WBC: 12.1 10*3/uL — ABNORMAL HIGH (ref 4.0–10.5)
nRBC: 0 % (ref 0.0–0.2)

## 2020-06-11 LAB — COMPREHENSIVE METABOLIC PANEL
ALT: 486 U/L — ABNORMAL HIGH (ref 0–44)
AST: 130 U/L — ABNORMAL HIGH (ref 15–41)
Albumin: 3.1 g/dL — ABNORMAL LOW (ref 3.5–5.0)
Alkaline Phosphatase: 86 U/L (ref 38–126)
Anion gap: 11 (ref 5–15)
BUN: 12 mg/dL (ref 6–20)
CO2: 24 mmol/L (ref 22–32)
Calcium: 8.8 mg/dL — ABNORMAL LOW (ref 8.9–10.3)
Chloride: 102 mmol/L (ref 98–111)
Creatinine, Ser: 0.79 mg/dL (ref 0.44–1.00)
GFR, Estimated: >60 mL/min (ref >60)
Glucose, Bld: 124 mg/dL — ABNORMAL HIGH (ref 70–99)
Potassium: 4.1 mmol/L (ref 3.5–5.1)
Sodium: 137 mmol/L (ref 135–145)
Total Bilirubin: 0.4 mg/dL (ref 0.3–1.2)
Total Protein: 6.8 g/dL (ref 6.5–8.1)

## 2020-06-11 LAB — GLUCOSE, CAPILLARY
Glucose-Capillary: 110 mg/dL — ABNORMAL HIGH (ref 70–99)
Glucose-Capillary: 119 mg/dL — ABNORMAL HIGH (ref 70–99)
Glucose-Capillary: 121 mg/dL — ABNORMAL HIGH (ref 70–99)
Glucose-Capillary: 142 mg/dL — ABNORMAL HIGH (ref 70–99)
Glucose-Capillary: 99 mg/dL (ref 70–99)

## 2020-06-11 LAB — C-REACTIVE PROTEIN: CRP: 1.1 mg/dL — ABNORMAL HIGH (ref <1.0)

## 2020-06-11 MED ORDER — APIXABAN 5 MG PO TABS: 5.0000 mg | ORAL_TABLET | Freq: Two times a day (BID) | ORAL | 1 refills | Status: DC

## 2020-06-11 MED ORDER — METOPROLOL TARTRATE 100 MG PO TABS: 100.0000 mg | ORAL_TABLET | Freq: Two times a day (BID) | ORAL | 1 refills | Status: AC

## 2020-06-11 NOTE — Progress Notes (Signed)
Pt will be leaving this evening to go home. Pt will be picked up by husband. Pt alert and oriented, and without c/o. Discharge instructions given/explained with pt verbalizing understanding. Pt aware to pickup prescriptions. Pt aware of followup appointments. Pt will be leaving with multiple bags, IS, oxygen and FWW, cell phone, computer.

## 2020-06-11 NOTE — Progress Notes (Signed)
Still awaiting delivery of oxygen and FWW. Have sent "chat" to CM, but it is now after 5pm.

## 2020-06-11 NOTE — Discharge Instructions (Signed)
Information on my medicine - ELIQUIS (apixaban)  This medication education was reviewed with me or my healthcare representative as part of my discharge preparation.   Why was Eliquis prescribed for you? Eliquis was prescribed to treat blood clots that may have been found in the veins of your legs (deep vein thrombosis) or in your lungs (pulmonary embolism) and to reduce the risk of them occurring again.  What do You need to know about Eliquis ? Your dose is ONE 5 mg tablet taken TWICE daily.  Eliquis may be taken with or without food.   Try to take the dose about the same time in the morning and in the evening. If you have difficulty swallowing the tablet whole please discuss with your pharmacist how to take the medication safely.  Take Eliquis exactly as prescribed and DO NOT stop taking Eliquis without talking to the doctor who prescribed the medication.  Stopping may increase your risk of developing a new blood clot.  Refill your prescription before you run out.  After discharge, you should have regular check-up appointments with your healthcare provider that is prescribing your Eliquis.    What do you do if you miss a dose? If a dose of ELIQUIS is not taken at the scheduled time, take it as soon as possible on the same day and twice-daily administration should be resumed. The dose should not be doubled to make up for a missed dose.  Important Safety Information A possible side effect of Eliquis is bleeding. You should call your healthcare provider right away if you experience any of the following: Bleeding from an injury or your nose that does not stop. Unusual colored urine (red or dark brown) or unusual colored stools (red or black). Unusual bruising for unknown reasons. A serious fall or if you hit your head (even if there is no bleeding).  Some medicines may interact with Eliquis and might increase your risk of bleeding or clotting while on Eliquis. To help avoid this,  consult your healthcare provider or pharmacist prior to using any new prescription or non-prescription medications, including herbals, vitamins, non-steroidal anti-inflammatory drugs (NSAIDs) and supplements.  This website has more information on Eliquis (apixaban): http://www.eliquis.com/eliquis/home  

## 2020-06-11 NOTE — Progress Notes (Signed)
[employer] has delivered portable oxygen concentrator & walker

## 2020-06-11 NOTE — Progress Notes (Signed)
SATURATION QUALIFICATIONS: (This note is used to comply with regulatory documentation for home oxygen)  Patient Saturations on Room Air at Rest = 69%  Patient Saturations on Room Air while Ambulating = 67%  Patient Saturations on 3 Liters of oxygen while Ambulating = 96%  Please briefly explain why patient needs home oxygen:  Pt's sats when walking on 2 L/min via [place] are 87%. Walking on 3 L/min got to 96%.

## 2020-06-11 NOTE — Discharge Summary (Signed)
Physician Discharge Summary  [name] XBJ:[id number] DOB: 01-25-1986 DOA: 05/20/2020  PCP: [facility]  Admit date: 05/20/2020 Discharge date: 06/11/2020  Admitted From: home Disposition:  home  Recommendations for Outpatient Follow-up:  1. Follow up with PCP in 3-5 days 2. Please obtain LFTs in 3-5 days 3. Please follow up on the following pending results:  Home Health: PT, OT Equipment/Devices: 2L [place]  Discharge Condition: stable CODE STATUS: Full code Diet recommendation: diabetic   HPI: Per admitting MD, Diane Padilla is a 34 y.o. female with medical history significant of  Obesity, DM2, OSA, PCOS Presented with  Covid symptoms of Sore throat, fever, cough, SOB, DOE loss of taste and smell nasal congestion no chest pain symptoms started on 11/18 Diagnosed on 11/21 Patient unvaccinated  Hospital Course / Discharge diagnoses: Principal problem Acute hypoxic respiratory failure due to COVID-19 pneumonia -patient was admitted to the hospital with respiratory failure due to COVID-19 pneumonia.  She had a prolonged hospital stay and was in stepdown for quite some time due to persistent hypoxia and high oxygen requirements.  She finished treatment with Remdesivir, baricitinib as well as steroids, and in addition she also got 7 days of antibiotics for possible bacterial pneumonia.  She eventually improved, she was able to be weaned off on 2 L nasal cannula which she is still requiring at rest and with ambulation.  Overall much improved, will be discharged home in stable condition and was advised to follow-up with PCP within 3 to 5 days.  She completed 21 days of isolation while hospitalized  Active problems Sinus tachycardia -persistent, possibly due to deconditioning and possible autonomic dysfunction secondary to Covid, has been placed on metoprolol, continue. Type 2 diabetes mellitus-controlled, A1c 6.6, she had hyperglycemia while on  steroids but sugars are much improved when steroids were discontinued.  Resume home Metformin.  Outpatient follow-up Elevated LFTs-secondary to Covid with underlying hepatic steatosis, ultrasound significant for steatosis otherwise letter no other acute findings, negative hepatitis panel.  LFTs overall are trending down, will recommend outpatient follow-up 3 to 5 days for repeat LFTs. Acute bilateral peroneal vein DVTs-likely due to Covid, CTA was negative for PE.  She will be transitioned to Eliquis Thrush -on nystatin, improved Morbid obesity-estimated BMI 48.9, should benefit from weight loss  Sepsis ruled out  Discharge Instructions   Allergies as of 06/11/2020   No Known Allergies     Medication List    STOP taking these medications   medroxyPROGESTERone 5 MG tablet Commonly known as: Provera     TAKE these medications   acetaminophen 500 MG tablet Commonly known as: TYLENOL Take 500-1,000 mg by mouth every 6 (six) hours as needed for mild pain or headache.   apixaban 5 MG Tabs tablet Commonly known as: ELIQUIS Take 1 tablet (5 mg total) by mouth 2 (two) times daily.   ascorbic acid 250 MG tablet Commonly known as: VITAMIN C Take 250 mg by mouth daily.   diphenhydramine-acetaminophen 25-500 MG Tabs tablet Commonly known as: TYLENOL PM Take 1 tablet by mouth at bedtime as needed (for sleep).   ELDERBERRY PO Take 1 tablet by mouth daily.   glucose blood test strip as directed.   hydrocortisone 2.5 % ointment Apply 1 application topically 2 (two) times daily as needed (for itching).   ketoconazole 2 % cream Commonly known as: NIZORAL Apply 1 application topically 2 (two) times daily as needed for irritation.   metFORMIN 500 MG 24 hr  tablet Commonly known as: GLUCOPHAGE-XR Take 500 mg by mouth in the morning, at noon, and at bedtime.   metoprolol tartrate 100 MG tablet Commonly known as: LOPRESSOR Take 1 tablet (100 mg total) by mouth 2 (two) times daily.    vitamin B-12 1000 MCG tablet Commonly known as: CYANOCOBALAMIN Take 1,000 mcg by mouth daily.   Vitamin D3 125 MCG (5000 UT) Caps Take 5,000 Units by mouth daily.            Durable Medical Equipment  (From admission, onward)         Start     Ordered   06/11/20 0926  For home use only DME oxygen  Once       Question Answer Comment  Length of Need 6 Months   Mode or (Route) Nasal cannula   Liters per Minute 2   Frequency Continuous (stationary and portable oxygen unit needed)   Oxygen delivery system Gas      06/11/20 0925           Consultations:  PCCM  Procedures/Studies:  CT ANGIO CHEST PE W OR WO CONTRAST  Result Date: 05/29/2020 CLINICAL DATA:  Shortness of breath.  COVID-19 positive. EXAM: CT ANGIOGRAPHY CHEST WITH CONTRAST TECHNIQUE: Multidetector CT imaging of the chest was performed using the standard protocol during bolus administration of intravenous contrast. Multiplanar CT image reconstructions and MIPs were obtained to evaluate the vascular anatomy. CONTRAST:  100mL OMNIPAQUE IOHEXOL 350 MG/ML SOLN COMPARISON:  Chest x-ray May 27, 2020 FINDINGS: Cardiovascular: Satisfactory opacification of the pulmonary arteries to the segmental level. No evidence of pulmonary embolism. Normal heart size. No pericardial effusion. Mediastinum/Nodes: There is a small hiatal hernia. There is a prominent prevascular node measuring 10 mm, likely reactive. No effusions. The thyroid is unremarkable. Lungs/Pleura: Central airways are normal. No pneumothorax. Diffuse ground-glass patchy pulmonary infiltrates are identified. Upper Abdomen: No acute abnormality. Musculoskeletal: No chest wall abnormality. No acute or significant osseous findings. Review of the MIP images confirms the above findings. IMPRESSION: 1. Diffuse patchy bilateral ground-glass opacities consistent with multifocal pneumonia. The findings are consistent with COVID-19 pneumonia given history. 2. No pulmonary  emboli identified.  No other acute abnormalities. Electronically Signed   By: Gerome Samavid  Williams III M.D   On: 05/29/2020 18:17   DG CHEST PORT 1 VIEW  Result Date: 05/27/2020 CLINICAL DATA:  COVID-19 positive EXAM: PORTABLE CHEST 1 VIEW COMPARISON:  Radiograph 05/20/2020 FINDINGS: Heterogeneous bilateral airspace opacities superimposed on diminishing lung volumes and increased atelectatic volume loss. No pneumothorax or visible effusion. Stable cardiomediastinal contours. No acute osseous or soft tissue abnormality. Telemetry leads overlie the chest. IMPRESSION: Persistent heterogeneous bilateral airspace opacities superimposed on some worsening atelectatic change. Electronically Signed   By: Kreg ShropshirePrice  DeHay M.D.   On: 05/27/2020 16:52   DG Chest Port 1 View  Result Date: 05/20/2020 CLINICAL DATA:  COVID EXAM: PORTABLE CHEST 1 VIEW COMPARISON:  None. FINDINGS: Multifocal patchy opacities in the right upper lobe and bilateral lower lobes. No pleural effusion or pneumothorax. The heart is normal in size. IMPRESSION: Multifocal pneumonia in this patient with known COVID. Electronically Signed   By: Charline BillsSriyesh  Krishnan M.D.   On: 05/20/2020 14:12   ECHOCARDIOGRAM COMPLETE  Result Date: 06/05/2020    ECHOCARDIOGRAM REPORT   Patient Name:   Diane Padilla Date of Exam: 06/05/2020 Medical Rec #:  [id number]         Height:       65.0 in Accession #:  [id number]        Weight:       293.9 lb Date of Birth:  [birth year]         BSA:          2.330 m Patient Age:    34 years          BP:           103/47 mmHg Patient Gender: F                 HR:           89 bpm. Exam Location:  Inpatient Procedure: 2D Echo Indications:    Elevated D dimer  History:        Patient has no prior history of Echocardiogram examinations.                 Covid pneumonia; Signs/Symptoms:Dyspnea.  Sonographer:    Eulah Pont RDCS Referring Phys: [id number] Tyrone Nine IMPRESSIONS  1. Left ventricular ejection fraction, by estimation, is 65  to 70%. The left ventricle has normal function. The left ventricle has no regional wall motion abnormalities. Left ventricular diastolic parameters were normal.  2. Right ventricular systolic function is normal. The right ventricular size is normal. There is normal pulmonary artery systolic pressure.  3. The mitral valve is normal in structure. No evidence of mitral valve regurgitation. No evidence of mitral stenosis.  4. The aortic valve is normal in structure. Aortic valve regurgitation is not visualized. No aortic stenosis is present.  5. The inferior vena cava is normal in size with greater than 50% respiratory variability, suggesting right atrial pressure of 3 mmHg. Conclusion(s)/Recommendation(s): Normal biventricular function without evidence of hemodynamically significant valvular heart disease. FINDINGS  Left Ventricle: Left ventricular ejection fraction, by estimation, is 65 to 70%. The left ventricle has normal function. The left ventricle has no regional wall motion abnormalities. The left ventricular internal cavity size was normal in size. There is  no left ventricular hypertrophy. Left ventricular diastolic parameters were normal. Right Ventricle: The right ventricular size is normal. No increase in right ventricular wall thickness. Right ventricular systolic function is normal. There is normal pulmonary artery systolic pressure. Left Atrium: Left atrial size was normal in size. Right Atrium: Right atrial size was normal in size. Pericardium: There is no evidence of pericardial effusion. Mitral Valve: The mitral valve is normal in structure. No evidence of mitral valve regurgitation. No evidence of mitral valve stenosis. Tricuspid Valve: The tricuspid valve is normal in structure. Tricuspid valve regurgitation is not demonstrated. No evidence of tricuspid stenosis. Aortic Valve: The aortic valve is normal in structure. Aortic valve regurgitation is not visualized. No aortic stenosis is present.  Pulmonic Valve: The pulmonic valve was normal in structure. Pulmonic valve regurgitation is not visualized. No evidence of pulmonic stenosis. Aorta: The aortic root is normal in size and structure. Venous: The inferior vena cava is normal in size with greater than 50% respiratory variability, suggesting right atrial pressure of 3 mmHg. IAS/Shunts: No atrial level shunt detected by color flow Doppler.  LEFT VENTRICLE PLAX 2D LVIDd:         4.80 cm  Diastology LVIDs:         2.50 cm  LV e' medial:    8.92 cm/s LV PW:         0.80 cm  LV E/e' medial:  5.8 LV IVS:        0.90 cm  LV e' lateral:  11.00 cm/s LVOT diam:     2.00 cm  LV E/e' lateral: 4.7 LV SV:         67 LV SV Index:   29 LVOT Area:     3.14 cm  RIGHT VENTRICLE RV S prime:     9.79 cm/s TAPSE (M-mode): 1.9 cm LEFT ATRIUM           Index       RIGHT ATRIUM           Index LA diam:      2.80 cm 1.20 cm/m  RA Area:     10.20 cm LA Vol (A2C): 26.0 ml 11.16 ml/m RA Volume:   18.00 ml  7.73 ml/m LA Vol (A4C): 24.8 ml 10.65 ml/m  AORTIC VALVE LVOT Vmax:   128.00 cm/s LVOT Vmean:  91.300 cm/s LVOT VTI:    0.214 m  AORTA Ao Root diam: 2.70 cm Ao Asc diam:  2.50 cm MITRAL VALVE MV Area (PHT): 3.48 cm    SHUNTS MV Decel Time: 218 msec    Systemic VTI:  0.21 m MV E velocity: 51.70 cm/s  Systemic Diam: 2.00 cm MV A velocity: 43.20 cm/s MV E/A ratio:  1.20 Tobias Alexander MD Electronically signed by Tobias Alexander MD Signature Date/Time: 06/05/2020/12:07:26 PM    Final    VAS [place] LOWER EXTREMITY VENOUS (DVT)  Result Date: 05/24/2020  Lower Venous DVT Study Indications: Elevated Ddimer.  Risk Factors: COVID 19 positive. Limitations: Body habitus and poor ultrasound/tissue interface. Comparison Study: No prior studies. Performing Technologist: Chanda Busing RVT  Examination Guidelines: A complete evaluation includes B-mode imaging, spectral Doppler, color Doppler, and power Doppler as needed of all accessible portions of each vessel. Bilateral testing is  considered an integral part of a complete examination. Limited examinations for reoccurring indications may be performed as noted. The reflux portion of the exam is performed with the patient in reverse Trendelenburg.  +-----+---------------+---------+-----------+----------+--------------+ RIGHTCompressibilityPhasicitySpontaneityPropertiesThrombus Aging +-----+---------------+---------+-----------+----------+--------------+ CFV  Full           Yes      Yes                                 +-----+---------------+---------+-----------+----------+--------------+ SFJ  Full                                                        +-----+---------------+---------+-----------+----------+--------------+ PERO                                              Acute          +-----+---------------+---------+-----------+----------+--------------+   +----+---------------+---------+-----------+----------+--------------+ LEFTCompressibilityPhasicitySpontaneityPropertiesThrombus Aging +----+---------------+---------+-----------+----------+--------------+ PERO                                             Acute          +----+---------------+---------+-----------+----------+--------------+     Summary: RIGHT: - Findings consistent with acute deep vein thrombosis involving the right peroneal veins. - No cystic structure found in the popliteal fossa.  LEFT: - Findings consistent  with acute deep vein thrombosis involving the left peroneal veins. - No cystic structure found in the popliteal fossa.  *See table(s) above for measurements and observations. Electronically signed by Waverly Ferrari MD on 05/24/2020 at 2:10:16 PM.    Final    US Abdomen Limited RUQ (LIVER/GB)  Result Date: 06/05/2020 CLINICAL DATA:  Elevated LFTs EXAM: ULTRASOUND ABDOMEN LIMITED RIGHT UPPER QUADRANT COMPARISON:  None. FINDINGS: Gallbladder: No gallstones or wall thickening visualized. No sonographic Murphy sign noted by  sonographer. Common bile duct: Diameter: 0.5 cm, within normal limits. Liver: No focal lesion identified. Liver parenchymal echogenicity is diffusely increased. Portal vein is patent on color Doppler imaging with normal direction of blood flow towards the liver. Other: None. IMPRESSION: Diffusely increased liver parenchymal echogenicity which is nonspecific but most commonly seen with hepatic steatosis. Electronically Signed   By: Emmaline Kluver M.D.   On: 06/05/2020 12:28      Subjective: -Doing very well, sitting in the chair, denies any shortness of breath.  Excited about going home  Discharge Exam: BP (!) 105/58 (BP Location: Right Arm)   Pulse 92   Temp 98.9 F (37.2 C) (Oral)   Resp 19   Ht 5\' 5"  (1.651 m)   Wt 133.3 kg   LMP 05/07/2020   SpO2 97%   BMI 48.90 kg/m   General: Pt is alert, awake, not in acute distress Cardiovascular: RRR, S1/S2 +, no rubs, no gallops Respiratory: CTA bilaterally, no wheezing, no rhonchi Abdominal: Soft, NT, ND, bowel sounds + Extremities: no edema, no cyanosis   The results of significant diagnostics from this hospitalization (including imaging, microbiology, ancillary and laboratory) are listed below for reference.     Microbiology: No results found for this or any previous visit (from the past 240 hour(s)).   Labs: Basic Metabolic Panel: Recent Labs  Lab 06/07/20 0352 06/08/20 0412 06/09/20 0419 06/10/20 0419 06/11/20 0341  NA 136 136 136 138 137  K 4.0 4.1 4.5 4.2 4.1  CL 102 99 102 103 102  CO2 24 26 23 24 24   GLUCOSE 131* 137* 132* 130* 124*  BUN 20 15 10 13 12   CREATININE 0.67 0.62 0.74 0.74 0.79  CALCIUM 9.2 8.9 9.2 8.8* 8.8*   Liver Function Tests: Recent Labs  Lab 06/07/20 0352 06/08/20 0412 06/09/20 0419 06/10/20 0419 06/11/20 0341  AST 161* 166* 159* 127* 130*  ALT 628* 656* 647* 556* 486*  ALKPHOS 89 95 102 91 86  BILITOT 0.3 0.3 0.5 0.2* 0.4  PROT 6.7 7.2 7.9 6.9 6.8  ALBUMIN 3.1* 3.3* 3.6 3.2* 3.1*    CBC: Recent Labs  Lab 06/09/20 0419 06/10/20 0419 06/11/20 0341  WBC 14.4* 14.0* 12.1*  HGB 12.2 10.8* 10.4*  HCT 37.6 33.9* 32.4*  MCV 85.1 86.7 86.2  PLT 345 309 297   CBG: Recent Labs  Lab 06/10/20 1640 06/10/20 2021 06/10/20 2359 06/11/20 0400 06/11/20 0820  GLUCAP 107* 128* 119* 121* 142*   Hgb A1c No results for input(s): HGBA1C in the last 72 hours. Lipid Profile No results for input(s): CHOL, HDL, LDLCALC, TRIG, CHOLHDL, LDLDIRECT in the last 72 hours. Thyroid function studies No results for input(s): TSH, T4TOTAL, T3FREE, THYROIDAB in the last 72 hours.  Invalid input(s): FREET3 Urinalysis    Component Value Date/Time   BILIRUBINUR negative 08/19/2018 1543   PROTEINUR Negative 08/19/2018 1543   UROBILINOGEN 0.2 08/19/2018 1543   NITRITE negative 08/19/2018 1543   LEUKOCYTESUR Moderate (2+) (A) 08/19/2018 1543    FURTHER  DISCHARGE INSTRUCTIONS:   Get Medicines reviewed and adjusted: Please take all your medications with you for your next visit with your Primary MD   Laboratory/radiological data: Please request your Primary MD to go over all hospital tests and procedure/radiological results at the follow up, please ask your Primary MD to get all Hospital records sent to his/her office.   In some cases, they will be blood work, cultures and biopsy results pending at the time of your discharge. Please request that your primary care M.D. goes through all the records of your hospital data and follows up on these results.   Also Note the following: If you experience worsening of your admission symptoms, develop shortness of breath, life threatening emergency, suicidal or homicidal thoughts you must seek medical attention immediately by calling 911 or calling your MD immediately  if symptoms less severe.   You must read complete instructions/literature along with all the possible adverse reactions/side effects for all the Medicines you take and that have been  prescribed to you. Take any new Medicines after you have completely understood and accpet all the possible adverse reactions/side effects.    Do not drive when taking Pain medications or sleeping medications (Benzodaizepines)   Do not take more than prescribed Pain, Sleep and Anxiety Medications. It is not advisable to combine anxiety,sleep and pain medications without talking with your primary care practitioner   Special Instructions: If you have smoked or chewed Tobacco  in the last 2 yrs please stop smoking, stop any regular Alcohol  and or any Recreational drug use.   Wear Seat belts while driving.   Please note: You were cared for by a hospitalist during your hospital stay. Once you are discharged, your primary care physician will handle any further medical issues. Please note that NO REFILLS for any discharge medications will be authorized once you are discharged, as it is imperative that you return to your primary care physician (or establish a relationship with a primary care physician if you do not have one) for your post hospital discharge needs so that they can reassess your need for medications and monitor your lab values.  Time coordinating discharge: 35 minutes  SIGNED:  Pamella Pert, MD, PhD 06/11/2020, 10:56 AM

## 2020-06-11 NOTE — Progress Notes (Signed)
[employer] driver has just called. He is on his way with FWW and oxygen.

## 2020-06-11 NOTE — TOC Progression Note (Signed)
Transition of Care [facility]) - Progression Note    Patient Details  Name: ROSALIND GUIDO MRN: [id number] Date of Birth: 01/15/1986  [facility] CM/SW Contact  Armanda Heritage, RN Phone Number: 06/11/2020, 2:26 PM  Clinical Narrative:    [employer] to deliver rolling walker and oxygen.  Frances Furbish will provide home health PT services.   Expected Discharge Plan: Home w Home Health Services Barriers to Discharge: No Barriers Identified  Expected Discharge Plan and Services Expected Discharge Plan: Home w Home Health Services   Discharge Planning Services: CM Consult   Living arrangements for the past 2 months: Apartment Expected Discharge Date: 06/11/20               DME Arranged: Dan Humphreys rolling,Oxygen DME Agency: [employer] Date DME Agency Contacted: 06/11/20 Time DME Agency Contacted: [phone] Representative spoke with at DME Agency: Kathline Magic HH Arranged: PT HH Agency: [facility] Date [facility] Agency Contacted: 06/11/20 Time HH Agency Contacted: 1426 Representative spoke with at [facility] Agency: Kandee Keen   Social Determinants of Health (SDOH) Interventions    Readmission Risk Interventions No flowsheet data found.

## 2020-06-11 NOTE — Progress Notes (Signed)
Pt has left/ discharged  the hospital with instructions, personal belongings, O2 concentrator & FWW. DC instructions reviewed again by me with pt &given to  her spouse

## 2020-06-11 NOTE — Plan of Care (Signed)
Care plan met 

## 2020-06-11 NOTE — Progress Notes (Signed)
Customer Service rep from [employer] has returned my call and will put in orders for O2 and FWW. Looks as if this was "dropped thru the cracks" as service rep states they have no orders for this pt. The driver will bring the needed items to the pt this evening, but it will be late. Pt aware and is ready to go home with husband when items are delivered.

## 2020-06-11 NOTE — Progress Notes (Signed)
Writer has made contact with [employer] customer service and left call back number to alert when the oxygen and rolling walker might be delivered.

## 2020-06-15 DIAGNOSIS — I824Z3 Acute embolism and thrombosis of unspecified deep veins of distal lower extremity, bilateral: Secondary | ICD-10-CM | POA: Diagnosis not present

## 2020-06-15 DIAGNOSIS — Z8616 Personal history of COVID-19: Secondary | ICD-10-CM | POA: Diagnosis not present

## 2020-06-15 DIAGNOSIS — R7989 Other specified abnormal findings of blood chemistry: Secondary | ICD-10-CM | POA: Diagnosis not present

## 2020-06-15 DIAGNOSIS — U071 COVID-19: Secondary | ICD-10-CM | POA: Diagnosis not present

## 2020-06-15 DIAGNOSIS — J9601 Acute respiratory failure with hypoxia: Secondary | ICD-10-CM | POA: Diagnosis not present

## 2020-08-09 DIAGNOSIS — I479 Paroxysmal tachycardia, unspecified: Secondary | ICD-10-CM | POA: Insufficient documentation

## 2021-02-28 ENCOUNTER — Emergency Department (HOSPITAL_COMMUNITY)
Admission: EM | Admit: 2021-02-28 | Discharge: 2021-02-28 | Disposition: A | Discharge status: Home / Self Care | Payer: BC Managed Care – PPO | Attending: Emergency Medicine | Admitting: Emergency Medicine

## 2021-02-28 ENCOUNTER — Other Ambulatory Visit: Payer: Self-pay

## 2021-02-28 ENCOUNTER — Emergency Department (HOSPITAL_COMMUNITY): Payer: BC Managed Care – PPO

## 2021-02-28 ENCOUNTER — Encounter (HOSPITAL_COMMUNITY): Payer: Self-pay

## 2021-02-28 DIAGNOSIS — Z7984 Long term (current) use of oral hypoglycemic drugs: Secondary | ICD-10-CM | POA: Insufficient documentation

## 2021-02-28 DIAGNOSIS — Z8616 Personal history of COVID-19: Secondary | ICD-10-CM | POA: Insufficient documentation

## 2021-02-28 DIAGNOSIS — E119 Type 2 diabetes mellitus without complications: Secondary | ICD-10-CM | POA: Insufficient documentation

## 2021-02-28 DIAGNOSIS — Z28310 Unvaccinated for covid-19: Secondary | ICD-10-CM | POA: Insufficient documentation

## 2021-02-28 DIAGNOSIS — Z7901 Long term (current) use of anticoagulants: Secondary | ICD-10-CM | POA: Diagnosis not present

## 2021-02-28 DIAGNOSIS — U071 COVID-19: Secondary | ICD-10-CM

## 2021-02-28 NOTE — ED Provider Notes (Signed)
Emergency Medicine Provider Triage Evaluation Note  Diane Padilla , a 35 y.o. female  was evaluated in triage.  Pt complains of hypoxia.  Patient states that she had a positive home COVID-19 test 4 days ago.  She has been experiencing cough, sore throat, rhinorrhea, congestion, as well as fatigue.  Denies any chest pain or shortness of breath.  She states that she is not vaccinated and had COVID-19 in November 2021 and states that she was hospitalized for this for 3 weeks.  She states she has been monitoring her oxygen saturations with a pulse oximeter and noted that her saturations dropped to around 91% this morning so she came to the emergency department for further evaluation.  She denies any abdominal pain, nausea, vomiting.  Physical Exam  BP (!) 177/88 (BP Location: Left Arm)   Pulse 81   Temp 98.5 F (36.9 C) (Oral)   Resp 18   Ht 5\' 5"  (1.651 m)   Wt (!) 147 kg   SpO2 96%   BMI 53.92 kg/m  Gen:   Awake, no distress   Resp:  Normal effort  MSK:   Moves extremities without difficulty  Other:    Medical Decision Making  Medically screening exam initiated at 12:45 PM.  Appropriate orders placed.  Aryel Edelen was informed that the remainder of the evaluation will be completed by another provider, this initial triage assessment does not replace that evaluation, and the importance of remaining in the ED until their evaluation is complete.   Arnell Asal, PA-C 02/28/21 1247    04/30/21, MD 03/01/21 1447

## 2021-02-28 NOTE — ED Provider Notes (Signed)
[facility] DEPT Provider Note   CSN: [id number] Arrival date & time: 02/28/21  1224     History Chief Complaint  Patient presents with   Covid Positive   low sats   Sore Throat    Diane Padilla is a 35 y.o. female.  HPI Patient is a 35 year old female with a medical history as noted below.  She presents to the emergency department today due to possible hypoxia.  Patient states that she had a positive home COVID-19 test, 4 days ago.  She has been experiencing a cough, sore throat, rhinorrhea, congestion, as well as fatigue.  She states that she is not vaccinated for COVID-19 but did have a previous COVID-19 infection in November 2021 and during this time required hospitalization.  She has been monitoring her pulse ox at home and noticed that this morning it dropped to about 91% so she came to the emergency department for further evaluation.  She denies any chest pain or shortness of breath.  Patient denies any abdominal pain, nausea, vomiting, or diarrhea.    Past Medical History:  Diagnosis Date   Allergy    Back pain    Depression    Diabetes mellitus without complication ([place]) 6/80/88   no meds at this time   Dyspnea    GERD (gastroesophageal reflux disease)    PCOS (polycystic ovarian syndrome)    Sleep apnea    Vitamin D deficiency     Patient Active Problem List   Diagnosis Date Noted   Acute deep vein thrombosis (DVT) of distal vein of lower extremity ([place]) 05/29/2020   Acute respiratory failure with hypoxia (HCC)    Pneumonia due to COVID-19 virus 05/20/2020   Acute hypoxemic respiratory failure due to COVID-19 [facility]) 05/20/2020   Hyponatremia 05/20/2020   Sleep apnea 12/14/2019   Type 2 diabetes mellitus with hyperglycemia, without long-term current use of insulin ([place]) 12/09/2018   Class 3 severe obesity with serious comorbidity and body mass index (BMI) of 50.0 to 59.9 in adult ([place]) 12/09/2018   PCOS (polycystic ovarian  syndrome) 01/09/2013   Oligomenorrhea 12/12/2012    Past Surgical History:  Procedure Laterality Date   WISDOM TOOTH EXTRACTION       OB History     Gravida  0   Para  0   Term  0   Preterm  0   AB  0   Living  0      SAB  0   IAB  0   Ectopic  0   Multiple  0   Live Births              Family History  Problem Relation Age of Onset   Diabetes Mother    Hypertension Mother    Hyperlipidemia Mother    Stroke Mother    Cancer Mother    Kidney disease Mother    Sleep apnea Mother    Drug abuse Mother    Obesity Mother    Hypertension Maternal Grandmother    Hyperlipidemia Maternal Grandmother    Drug abuse Father    Breast cancer Cousin 30       chemo is done and upcoming surgery - ?BRCA Negative    Social History   Tobacco Use   Smoking status: Never   Smokeless tobacco: Never  Vaping Use   Vaping Use: Never used  Substance Use Topics   Alcohol use: No   Drug use: No    Home  Medications Prior to Admission medications   Medication Sig Start Date End Date Taking? Authorizing Provider  acetaminophen (TYLENOL) 500 MG tablet Take 500-1,000 mg by mouth every 6 (six) hours as needed for mild pain or headache.    [provider]  apixaban (ELIQUIS) 5 MG TABS tablet Take 1 tablet (5 mg total) by mouth 2 (two) times daily. 06/11/20   Caren Griffins, MD  ascorbic acid (VITAMIN C) 250 MG tablet Take 250 mg by mouth daily.    [provider]  Cholecalciferol (VITAMIN D3) 125 MCG (5000 UT) CAPS Take 5,000 Units by mouth daily.    [provider]  diphenhydramine-acetaminophen (TYLENOL PM) 25-500 MG TABS tablet Take 1 tablet by mouth at bedtime as needed (for sleep).     [provider]  ELDERBERRY PO Take 1 tablet by mouth daily.     [provider]  hydrocortisone 2.5 % ointment Apply 1 application topically 2 (two) times daily as needed (for itching).  02/16/20   [provider]  ketoconazole  (NIZORAL) 2 % cream Apply 1 application topically 2 (two) times daily as needed for irritation.  02/16/20   [provider]  metFORMIN (GLUCOPHAGE-XR) 500 MG 24 hr tablet Take 500 mg by mouth in the morning, at noon, and at bedtime.     [provider]  metoprolol tartrate (LOPRESSOR) 100 MG tablet Take 1 tablet (100 mg total) by mouth 2 (two) times daily. 06/11/20   Caren Griffins, MD  vitamin B-12 (CYANOCOBALAMIN) 1000 MCG tablet Take 1,000 mcg by mouth daily.    [provider]    Allergies    Patient has no known allergies.  Review of Systems   Review of Systems  Constitutional:  Positive for activity change and fatigue.  HENT:  Positive for congestion and rhinorrhea.   Respiratory:  Positive for cough. Negative for shortness of breath.   Cardiovascular:  Negative for chest pain.  Gastrointestinal:  Negative for abdominal pain, diarrhea, nausea and vomiting.   Physical Exam Updated Vital Signs BP (!) 177/88 (BP Location: Left Arm)   Pulse 81   Temp 98.5 F (36.9 C) (Oral)   Resp 18   Ht 5' 5"  (1.651 m)   Wt (!) 147 kg   LMP 01/05/2021 (Exact Date)   SpO2 95%   BMI 53.92 kg/m   Physical Exam Vitals and nursing note reviewed.  Constitutional:      General: She is not in acute distress.    Appearance: Normal appearance. She is not ill-appearing, toxic-appearing or diaphoretic.  HENT:     Head: Normocephalic and atraumatic.     Right Ear: External ear normal.     Left Ear: External ear normal.     Nose: Nose normal.     Mouth/Throat:     Mouth: Mucous membranes are moist.     Pharynx: Oropharynx is clear. No oropharyngeal exudate or posterior oropharyngeal erythema.  Eyes:     Extraocular Movements: Extraocular movements intact.  Cardiovascular:     Rate and Rhythm: Normal rate and regular rhythm.     Pulses: Normal pulses.     Heart sounds: Normal heart sounds. No murmur heard.   No friction rub. No gallop.     Comments: Regular rate  and rhythm without murmurs, rubs, or gallops. Pulmonary:     Effort: Pulmonary effort is normal. No respiratory distress.     Breath sounds: Normal breath sounds. No stridor. No wheezing, rhonchi or rales.  Comments: Lungs are clear to auscultation bilaterally.  No wheezing, rales, or rhonchi.  Symmetrical rise and fall the chest with inspiration and expiration.  Speaking in clear and complete sentences.  No respiratory distress noted. Abdominal:     General: Abdomen is flat. There is no distension.  Musculoskeletal:        General: Normal range of motion.     Cervical back: Normal range of motion and neck supple. No tenderness.  Skin:    General: Skin is warm and dry.  Neurological:     General: No focal deficit present.     Mental Status: She is alert and oriented to person, place, and time.  Psychiatric:        Mood and Affect: Mood normal.        Behavior: Behavior normal.    ED Results / Procedures / Treatments   Labs (all labs ordered are listed, but only abnormal results are displayed) Labs Reviewed - No data to display  EKG None  Radiology DG Chest Portable 1 View  Result Date: 02/28/2021 CLINICAL DATA:  Covid; SOB EXAM: PORTABLE CHEST 1 VIEW COMPARISON:  05/27/2020. FINDINGS: Mild peripheral and basilar predominant airspace opacities. No visible pleural effusions or pneumothorax. Mild enlargement of the cardiac silhouette, accentuated by AP portable technique. No acute osseous abnormality. IMPRESSION: Mild peripheral and basilar predominant airspace opacities, suspicious for pneumonia in this patient with COVID. Electronically Signed   By: Margaretha Sheffield M.D.   On: 02/28/2021 13:24    Procedures Procedures   Medications Ordered in ED Medications - No data to display  ED Course  I have reviewed the triage vital signs and the nursing notes.  Pertinent labs & imaging results that were available during my care of the patient were reviewed by me and considered in my  medical decision making (see chart for details).    MDM Rules/Calculators/A&P                          Patient is a 35 year old female who presents to the emergency department due to symptoms related to COVID-19.  Physical exam is reassuring.  Lungs are clear to auscultation bilaterally.  No wheezing, rales, or rhonchi.  Heart is regular rate and rhythm without murmurs, rubs, or gallops.  She did note an episode of hypoxia to about 91% while at home but since arriving to the emergency department her oxygen saturations have been maintained above 95% on room air.  She was ambulated as well and her pulse ox stayed above 95%.  Patient denies any chest pain or shortness of breath to me.  She states that she was admitted for COVID-19 last year and is "just trying to be cautious".  Chest x-ray concerning for possible developing pneumonia.  Given her recent diagnosis this is likely viral in nature.  Do not feel that antibiotics are warranted at this time.  Patient is agreeable.  Feel that the patient is stable for discharge at this time and she is agreeable.  Urged her to continue to quarantine and stay adequately hydrated.  Also urged the patient to continue to monitor pulse ox and return to the emergency department if her symptoms should worsen.  Her questions were answered and she was amicable at the time of discharge.  Final Clinical Impression(s) / ED Diagnoses Final diagnoses:  [id number]   Rx / DC Orders ED Discharge Orders     None  Rayna Sexton, PA-C 02/28/21 1419    Carmin Muskrat, MD 03/01/21 1447

## 2021-02-28 NOTE — ED Triage Notes (Signed)
Per EMS- Patient reports that she had a home covid test 4 days ago. Patient states that her Sats wer 90- 91 with finger pulse ox. Sats in triage 96% room air.

## 2021-02-28 NOTE — ED Notes (Signed)
Started to triage the patient, but she  went to the bathroom instead. Will do vital signs when patient returns.

## 2021-02-28 NOTE — Discharge Instructions (Addendum)
Please continue to monitor your symptoms closely.  I would also recommend that you continue to check your pulse ox throughout the day.  If you begin developing chest pain, shortness of breath, worsening symptoms, or if you notice that your pulse ox is lowering into the 80s persistently, please come back to the emergency department immediately for reevaluation.  It was a pleasure to meet you.

## 2021-04-19 ENCOUNTER — Ambulatory Visit: Payer: Self-pay | Admitting: Obstetrics and Gynecology

## 2021-05-26 DIAGNOSIS — E559 Vitamin D deficiency, unspecified: Secondary | ICD-10-CM | POA: Insufficient documentation

## 2021-05-30 NOTE — Progress Notes (Signed)
35 y.o. G0P0000 Married Black or [place] American Not Hispanic or Latino female here for annual exam.  She had monthly cycles from 11/21-7/22. Since July she hasn't had a cycle. Last UPT was many months ago. Sexually active, not frequent. Not using contraception, okay if she gets pregnant, but not actively trying.  Period Duration (Days): 5-7 Period Pattern: (!) Irregular Menstrual Flow: Moderate, Light Menstrual Control: Thin pad Menstrual Control Change Freq (Hours): 3-4 Dysmenorrhea: None  She got really sick with Covid in 11/21, admitted to the hospital for ~1 month. Had pneumonia and a DVT.  Got covid again in 9/22.   She has a long h/o oligomenorrhea then anovulatory bleeding. Last endometrial biopsy was 4 years ago. She has always had normal thyroid screening.  Previously given provera to take cyclically.   H/O DM, HgbA1C 7.1 last week.   Patient's last menstrual period was 01/03/2021.          Sexually active: Yes.    The current method of family planning is none.    Exercising: No.  The patient does not participate in regular exercise at present. Smoker:  no  Health Maintenance: Pap:  03/27/2018 WNL NEG HPV; 11/16/2014 normal with negative HPV History of abnormal Pap:  no MMG:  03/15/2017 bilateral diagnostic mammogram with right breast ultrasound birads 1 negative BMD:   na Colonoscopy: NA TDaP:  10/21/09, declines.  Gardasil: no    reports that she has never smoked. She has never used smokeless tobacco. She reports that she does not drink alcohol and does not use drugs. Rare ETOH. 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 She lost her job because she was out of work for over 6 months with covid (was on O2 for over 6 months).   Past Medical History:  Diagnosis Date   Allergy    Back pain    Depression    Diabetes mellitus without complication ([place]) 10[birth year]   no meds at this time   Dyspnea    GERD (gastroesophageal reflux disease)    PCOS (polycystic ovarian  syndrome)    Sleep apnea    Vitamin D deficiency     Past Surgical History:  Procedure Laterality Date   WISDOM TOOTH EXTRACTION      Current Outpatient Medications  Medication Sig Dispense Refill   acetaminophen (TYLENOL) 500 MG tablet Take 500-1,000 mg by mouth every 6 (six) hours as needed for mild pain or headache.     ascorbic acid (VITAMIN C) 250 MG tablet Take 250 mg by mouth daily.     Cholecalciferol (VITAMIN D3) 125 MCG (5000 UT) CAPS Take 5,000 Units by mouth daily.     diphenhydramine-acetaminophen (TYLENOL PM) 25-500 MG TABS tablet Take 1 tablet by mouth at bedtime as needed (for sleep).      ELDERBERRY PO Take 1 tablet by mouth daily.      hydrocortisone 2.5 % ointment Apply 1 application topically 2 (two) times daily as needed (for itching).      ketoconazole (NIZORAL) 2 % cream Apply 1 application topically 2 (two) times daily as needed for irritation.      metFORMIN (GLUCOPHAGE-XR) 500 MG 24 hr tablet Take 500 mg by mouth in the morning, at noon, and at bedtime.      metoprolol tartrate (LOPRESSOR) 100 MG tablet Take 1 tablet (100 mg total) by mouth 2 (two) times daily. 60 tablet 1   rosuvastatin (CRESTOR) 10 MG tablet Take 1 tablet by mouth daily.  vitamin B-12 (CYANOCOBALAMIN) 1000 MCG tablet Take 1,000 mcg by mouth daily.     No current facility-administered medications for this visit.    Family History  Problem Relation Age of Onset   Diabetes Mother    Hypertension Mother    Hyperlipidemia Mother    Stroke Mother    Cancer Mother    Kidney disease Mother    Sleep apnea Mother    Drug abuse Mother    Obesity Mother    Hypertension Maternal Grandmother    Hyperlipidemia Maternal Grandmother    Drug abuse Father    Breast cancer Cousin 10       chemo is done and upcoming surgery - ?BRCA Negative    Review of Systems  All other systems reviewed and are negative.  Exam:   BP 130/82   Pulse 96   Ht 5' 5"  (1.651 m)   Wt (!) 345 lb (156.5 kg)    LMP 01/03/2021   SpO2 98%   BMI 57.41 kg/m   Weight change: @WEIGHTCHANGE @ Height:   Height: 5' 5"  (165.1 cm)  Ht Readings from Last 3 Encounters:  06/05/21 5' 5"  (1.651 m)  02/28/21 5' 5"  (1.651 m)  05/31/20 5' 5"  (1.651 m)    General appearance: alert, cooperative and appears stated age Head: Normocephalic, without obvious abnormality, atraumatic Neck: no adenopathy, supple, symmetrical, trachea midline and thyroid normal to inspection and palpation Lungs: clear to auscultation bilaterally Cardiovascular: regular rate and rhythm Breasts: normal appearance, no masses or tenderness Abdomen: soft, non-tender; non distended,  no masses,  no organomegaly Extremities: extremities normal, atraumatic, no cyanosis or edema Skin: Skin color, texture, turgor normal. No rashes or lesions Lymph nodes: Cervical, supraclavicular, and axillary nodes normal. No abnormal inguinal nodes palpated Neurologic: Grossly normal   Pelvic: External genitalia:  no lesions              Urethra:  normal appearing urethra with no masses, tenderness or lesions              Bartholins and Skenes: normal                 Vagina: normal appearing vagina with normal color and discharge, no lesions              Cervix: no lesions               Bimanual Exam:  Uterus:   no masses or tenderness              Adnexa: no mass, fullness, tenderness               Rectovaginal: Confirms               Anus:  normal sphincter tone, no lesions  Gae Dry chaperoned for the exam.  1. Well woman exam Discussed breast self exam Discussed calcium and vit D intake Labs with primary No pap this year  2. Amenorrhea Long h/o of oligomenorrhea. Has had a negative w/u - Pregnancy, urine - medroxyPROGESTERone (PROVERA) 5 MG tablet; Take one tablet a day for 5 day every other month if no spontaneous cycles. You must abstain from intercourse for 2 weeks prior and have a negative pregnancy test.  Dispense: 15 tablet; Refill:  3  3. Family planning Strongly encouraged the use of condoms until she is off of crestor. Discussed the importance of good control of her DM prior to pregnancy.  Working on weight loss Take PNV  4.  BMI 50.0-59.9, adult [facility]) Given the # for the weight loss clinic

## 2021-06-05 ENCOUNTER — Other Ambulatory Visit: Payer: Self-pay

## 2021-06-05 ENCOUNTER — Ambulatory Visit (INDEPENDENT_AMBULATORY_CARE_PROVIDER_SITE_OTHER): Payer: BC Managed Care – PPO | Admitting: Obstetrics and Gynecology

## 2021-06-05 ENCOUNTER — Encounter: Payer: Self-pay | Admitting: Obstetrics and Gynecology

## 2021-06-05 VITALS — BP 130/82 | HR 96 | Ht 65.0 in | Wt 345.0 lb

## 2021-06-05 DIAGNOSIS — Z6841 Body Mass Index (BMI) 40.0 and over, adult: Secondary | ICD-10-CM | POA: Diagnosis not present

## 2021-06-05 DIAGNOSIS — Z01419 Encounter for gynecological examination (general) (routine) without abnormal findings: Secondary | ICD-10-CM

## 2021-06-05 DIAGNOSIS — N912 Amenorrhea, unspecified: Secondary | ICD-10-CM | POA: Diagnosis not present

## 2021-06-05 DIAGNOSIS — Z3009 Encounter for other general counseling and advice on contraception: Secondary | ICD-10-CM | POA: Diagnosis not present

## 2021-06-05 LAB — PREGNANCY, URINE: Preg Test, Ur: NEGATIVE

## 2021-06-05 MED ORDER — MEDROXYPROGESTERONE ACETATE 5 MG PO TABS: ORAL_TABLET | ORAL | 3 refills | Status: DC

## 2021-06-05 NOTE — Patient Instructions (Signed)

## 2022-01-31 ENCOUNTER — Encounter (INDEPENDENT_AMBULATORY_CARE_PROVIDER_SITE_OTHER): Payer: Self-pay

## 2022-06-04 NOTE — Progress Notes (Signed)
36 y.o. [place] Married Black or [place] American Not Hispanic or Latino female here for annual exam.   Period Pattern: (!) Irregular Menstrual Flow: Moderate Menstrual Control: Thin pad Menstrual Control Change Freq (Hours): 6-8 Dysmenorrhea: None Cycles have been coming every 1-2 months. Typically bleeds for 7 days.  She has currently been spotting x 21 days, this period started late. This is the first time this year she has been for more than a week.  Not sexually active for well over 2 weeks. Not using contraception.   She has a long h/o oligomenorrhea, prior negative w/u. She has a script for cyclic provera.   She got really sick with Covid in 11/21, admitted to the hospital for ~1 month. Had pneumonia and a DVT.  Got covid again in 9/22.   H/O DM, last HgbA1C was 8.1%. She isn't using contraception, not trying to get pregnant. Working on getting her HgbA1C down.   Patient's last menstrual period was 06/05/2022.          Sexually active: Yes.    The current method of family planning is none.    Exercising: Yes.     Cardio and weights  Smoker:  no  Health Maintenance: Pap: 03/27/2018 WNL NEG HPV; 11/16/2014 normal with negative HPV History of abnormal Pap:  no MMG:   03/15/2017 bilateral diagnostic mammogram with right breast ultrasound birads 1 negative BMD:   na Colonoscopy: NA TDaP:  10/21/09, declines.  Gardasil: no   reports that she has never smoked. She has never used smokeless tobacco. She reports that she does not drink alcohol and does not use drugs. She was working as a Education officer, museum, currently not working. Considering her options.   Past Medical History:  Diagnosis Date   Allergy    Back pain    Depression    Diabetes mellitus without complication ([place]) 4/40/10   no meds at this time   Dyspnea    GERD (gastroesophageal reflux disease)    PCOS (polycystic ovarian syndrome)    Sleep apnea    Vitamin D deficiency     Past Surgical History:  Procedure  Laterality Date   WISDOM TOOTH EXTRACTION      Current Outpatient Medications  Medication Sig Dispense Refill   acetaminophen (TYLENOL) 500 MG tablet Take 500-1,000 mg by mouth every 6 (six) hours as needed for mild pain or headache.     ascorbic acid (VITAMIN C) 250 MG tablet Take 250 mg by mouth daily.     Cholecalciferol (VITAMIN D3) 125 MCG (5000 UT) CAPS Take 5,000 Units by mouth daily.     diphenhydramine-acetaminophen (TYLENOL PM) 25-500 MG TABS tablet Take 1 tablet by mouth at bedtime as needed (for sleep).      ELDERBERRY PO Take 1 tablet by mouth daily.      hydrocortisone 2.5 % ointment Apply 1 application topically 2 (two) times daily as needed (for itching).      ketoconazole (NIZORAL) 2 % cream Apply 1 application topically 2 (two) times daily as needed for irritation.      metFORMIN (GLUCOPHAGE-XR) 500 MG 24 hr tablet Take 500 mg by mouth in the morning, at noon, and at bedtime.      metoprolol tartrate (LOPRESSOR) 100 MG tablet Take 1 tablet (100 mg total) by mouth 2 (two) times daily. 60 tablet 1   rosuvastatin (CRESTOR) 10 MG tablet Take 1 tablet by mouth daily.     vitamin B-12 (CYANOCOBALAMIN) 1000 MCG tablet Take 1,000 mcg by  mouth daily.     medroxyPROGESTERone (PROVERA) 5 MG tablet Take one tablet a day for 5 day every other month if no spontaneous cycles. You must abstain from intercourse for 2 weeks prior and have a negative pregnancy test. (Patient not taking: Reported on 06/14/2022) 15 tablet 3   No current facility-administered medications for this visit.    Family History  Problem Relation Age of Onset   Diabetes Mother    Hypertension Mother    Hyperlipidemia Mother    Stroke Mother    Cancer Mother    Kidney disease Mother    Sleep apnea Mother    Drug abuse Mother    Obesity Mother    Hypertension Maternal Grandmother    Hyperlipidemia Maternal Grandmother    Drug abuse Father    Breast cancer Cousin 27       chemo is done and upcoming surgery -  ?BRCA Negative    Review of Systems  All other systems reviewed and are negative.   Exam:   BP 110/72   Pulse 75   Ht _0  (1.651 m)   Wt (!) 337 lb (152.9 kg)   LMP 06/05/2022   SpO2 100%   BMI 56.08 kg/m   Weight change: _1 @ Height:   Height: _2  (165.1 cm)  Ht Readings from Last 3 Encounters:  06/14/22 _3  (1.651 m)  06/05/21 _4  (1.651 m)  02/28/21 _5  (1.651 m)    General appearance: alert, cooperative and appears stated age Head: Normocephalic, without obvious abnormality, atraumatic Neck: no adenopathy, supple, symmetrical, trachea midline and thyroid normal to inspection and palpation Lungs: clear to auscultation bilaterally Cardiovascular: regular rate and rhythm Breasts: normal appearance, no masses or tenderness Abdomen: soft, non-tender; non distended,  no masses,  no organomegaly Extremities: extremities normal, atraumatic, no cyanosis or edema Skin: Skin color, texture, turgor normal. No rashes or lesions Lymph nodes: Cervical, supraclavicular, and axillary nodes normal. No abnormal inguinal nodes palpated Neurologic: Grossly normal   Pelvic: External genitalia:  no lesions              Urethra:  normal appearing urethra with no masses, tenderness or lesions              Bartholins and Skenes: normal                 Vagina: normal appearing vagina with normal color and discharge, no lesions              Cervix: no lesions               Bimanual Exam:  Uterus:   no masses or tenderness              Adnexa: no mass, fullness, tenderness               Rectovaginal: Confirms               Anus:  normal sphincter tone, no lesions  Gae Dry, CMA chaperoned for the exam.  1. Well woman exam Discussed breast self exam Discussed calcium and vit D intake Labs with primary  2. Screening for cervical cancer - Cytology - PAP  3. Abnormal uterine bleeding (AUB) - medroxyPROGESTERone (PROVERA) 10 MG tablet; Take 1 tablet (10 mg total) by  mouth daily.  Dispense: 10 tablet; Refill: 0 - Pregnancy, urine  4. Oligomenorrhea, unspecified type - medroxyPROGESTERone (PROVERA) 5 MG tablet; Take one tablet a day for 5 day every  other month if no spontaneous cycles. You must abstain from intercourse for 2 weeks prior and have a negative pregnancy test.  Dispense: 15 tablet; Refill: 3  5. Type 2 diabetes mellitus with hyperglycemia, without long-term current use of insulin (HCC) Discussed the importance of getting her diabetes prior to conception  6. General counseling and advice on female contraception Encouraged use of condoms until she gets her DM under control and changes some of her medications

## 2022-06-11 ENCOUNTER — Encounter: Payer: Self-pay | Admitting: Obstetrics and Gynecology

## 2022-06-14 ENCOUNTER — Ambulatory Visit (INDEPENDENT_AMBULATORY_CARE_PROVIDER_SITE_OTHER): Payer: BC Managed Care – PPO | Admitting: Obstetrics and Gynecology

## 2022-06-14 ENCOUNTER — Other Ambulatory Visit (HOSPITAL_COMMUNITY)
Admission: RE | Admit: 2022-06-14 | Discharge: 2022-06-14 | Disposition: A | Discharge status: Home / Self Care | Payer: BC Managed Care – PPO | Source: Ambulatory Visit | Attending: Obstetrics and Gynecology | Admitting: Obstetrics and Gynecology

## 2022-06-14 VITALS — BP 110/72 | HR 75 | Ht 65.0 in | Wt 337.0 lb

## 2022-06-14 DIAGNOSIS — Z124 Encounter for screening for malignant neoplasm of cervix: Secondary | ICD-10-CM | POA: Diagnosis present

## 2022-06-14 DIAGNOSIS — Z01419 Encounter for gynecological examination (general) (routine) without abnormal findings: Secondary | ICD-10-CM

## 2022-06-14 DIAGNOSIS — E1165 Type 2 diabetes mellitus with hyperglycemia: Secondary | ICD-10-CM | POA: Diagnosis not present

## 2022-06-14 DIAGNOSIS — N939 Abnormal uterine and vaginal bleeding, unspecified: Secondary | ICD-10-CM

## 2022-06-14 DIAGNOSIS — N915 Oligomenorrhea, unspecified: Secondary | ICD-10-CM

## 2022-06-14 DIAGNOSIS — Z3009 Encounter for other general counseling and advice on contraception: Secondary | ICD-10-CM

## 2022-06-14 LAB — PREGNANCY, URINE: Preg Test, Ur: NEGATIVE

## 2022-06-14 MED ORDER — MEDROXYPROGESTERONE ACETATE 10 MG PO TABS: 10.0000 mg | ORAL_TABLET | Freq: Every day | ORAL | 0 refills | Status: DC

## 2022-06-14 MED ORDER — MEDROXYPROGESTERONE ACETATE 5 MG PO TABS: ORAL_TABLET | ORAL | 3 refills | Status: DC

## 2022-06-15 ENCOUNTER — Encounter: Payer: Self-pay | Admitting: Obstetrics and Gynecology

## 2022-06-15 NOTE — Patient Instructions (Signed)

## 2022-06-21 LAB — CYTOLOGY - PAP
Comment: NEGATIVE
Diagnosis: NEGATIVE
High risk HPV: NEGATIVE

## 2022-07-31 IMAGING — DX DG CHEST 1V PORT
1 series · 1 of 1 positions shown · non-contrast
Comparison: None.

CLINICAL DATA: COVID

EXAM:
PORTABLE CHEST 1 VIEW

[chest ap]
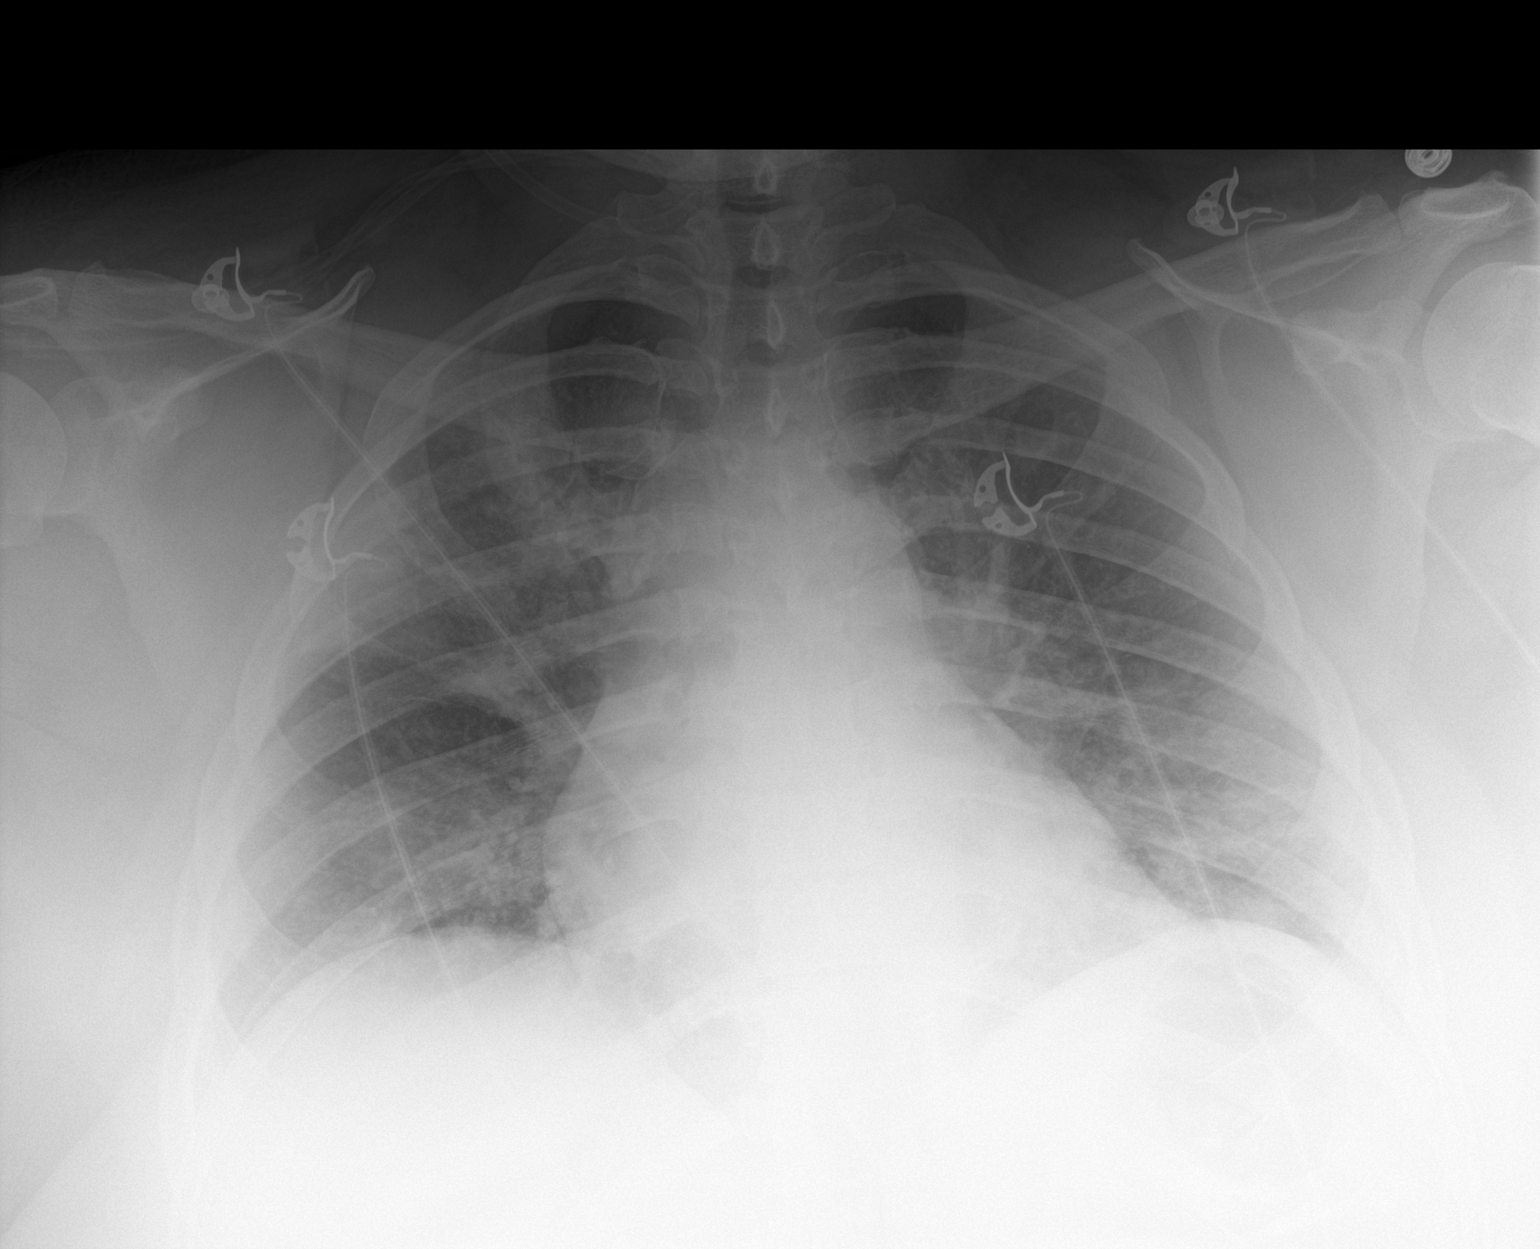

[1 of 1 positions shown; findings below may reference images not displayed]

FINDINGS: Multifocal patchy opacities in the right upper lobe and bilateral
lower lobes. No pleural effusion or pneumothorax.

The heart is normal in size.
IMPRESSION: Multifocal pneumonia in this patient with known COVID.

## 2023-05-11 IMAGING — DX DG CHEST 1V PORT
1 series · 1 of 1 positions shown · non-contrast
Comparison: 05/27/2020.

CLINICAL DATA: Covid; SOB

EXAM:
PORTABLE CHEST 1 VIEW

[chest ap]
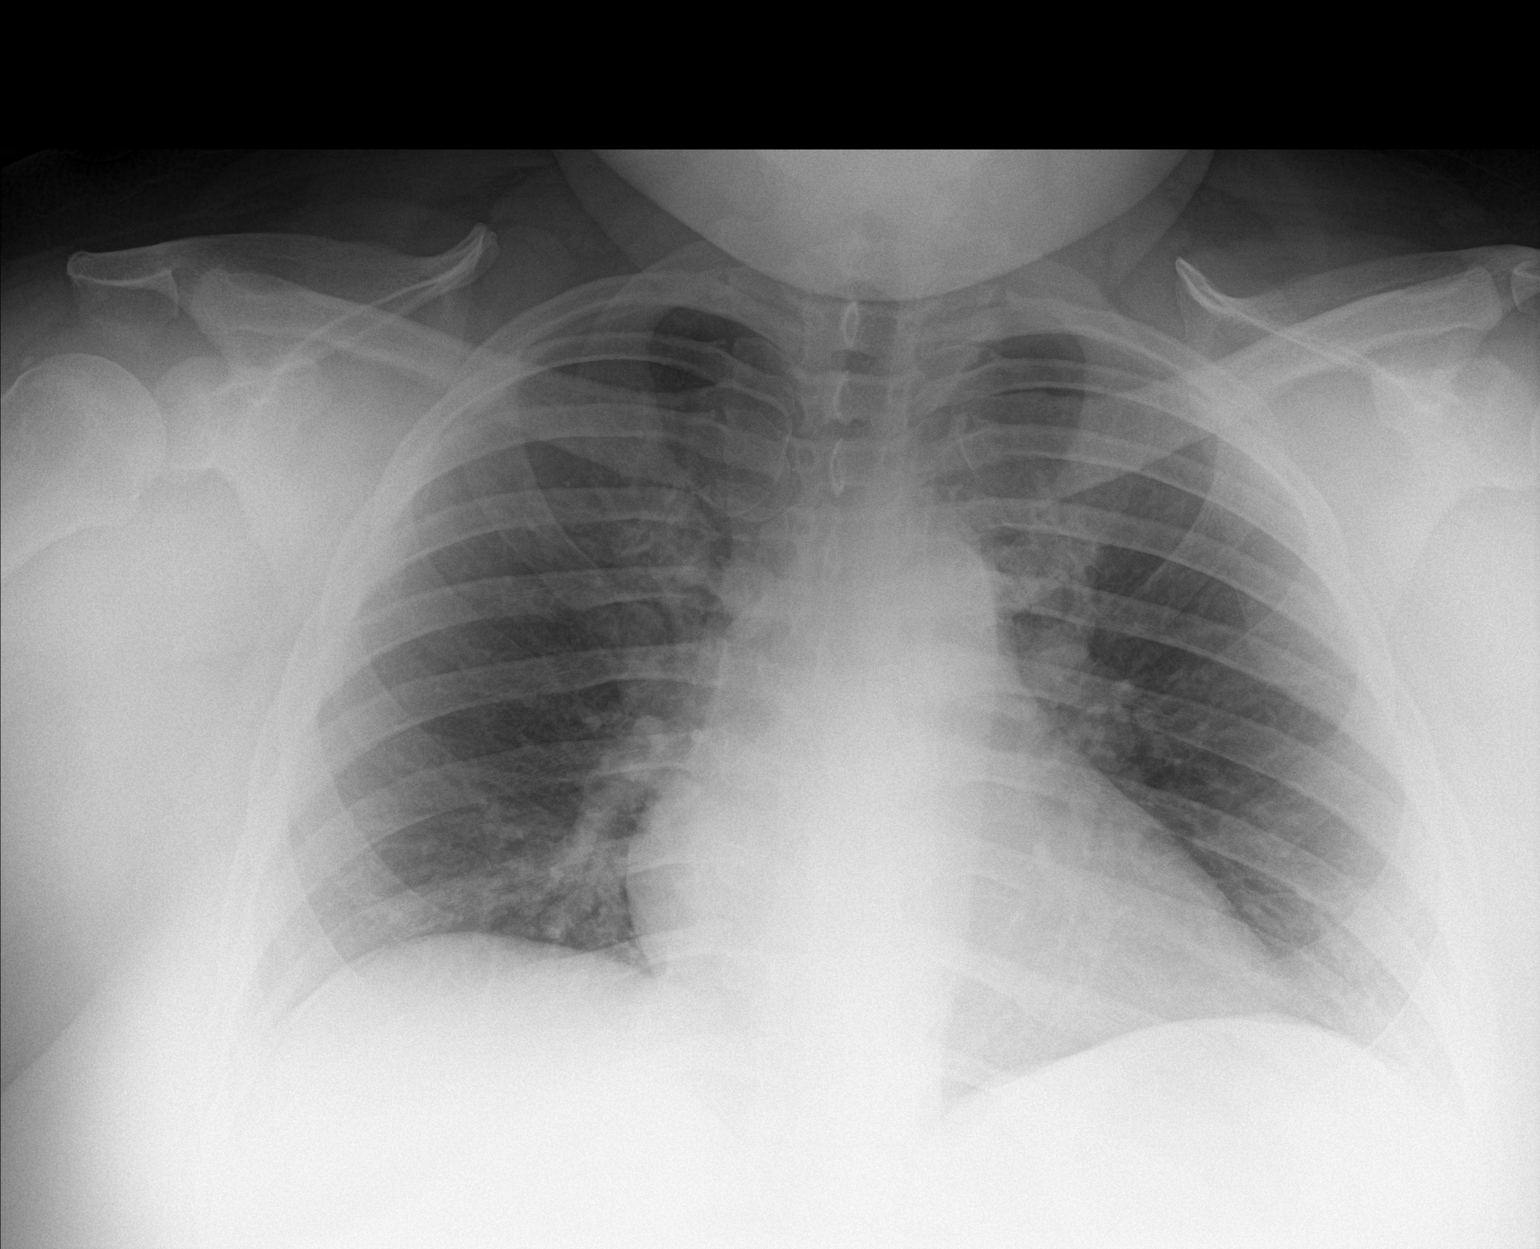

[1 of 1 positions shown; findings below may reference images not displayed]

FINDINGS: Mild peripheral and basilar predominant airspace opacities. No
visible pleural effusions or pneumothorax. Mild enlargement of the
cardiac silhouette, accentuated by AP portable technique. No acute
osseous abnormality.
IMPRESSION: Mild peripheral and basilar predominant airspace opacities,
suspicious for pneumonia in this patient with COVID.

## 2023-06-20 ENCOUNTER — Ambulatory Visit: Payer: BC Managed Care – PPO | Admitting: Obstetrics and Gynecology

## 2023-06-25 ENCOUNTER — Encounter: Payer: Self-pay | Admitting: Obstetrics and Gynecology

## 2023-06-25 ENCOUNTER — Ambulatory Visit (INDEPENDENT_AMBULATORY_CARE_PROVIDER_SITE_OTHER): Payer: BC Managed Care – PPO | Admitting: Obstetrics and Gynecology

## 2023-06-25 VITALS — BP 144/78 | HR 90 | Ht 65.25 in | Wt 316.0 lb

## 2023-06-25 DIAGNOSIS — N915 Oligomenorrhea, unspecified: Secondary | ICD-10-CM | POA: Diagnosis not present

## 2023-06-25 DIAGNOSIS — Z01419 Encounter for gynecological examination (general) (routine) without abnormal findings: Secondary | ICD-10-CM | POA: Insufficient documentation

## 2023-06-25 MED ORDER — MEDROXYPROGESTERONE ACETATE 10 MG PO TABS: 10.0000 mg | ORAL_TABLET | Freq: Every day | ORAL | 0 refills | Status: AC

## 2023-06-25 NOTE — Progress Notes (Signed)
 37 y.o. G0P0000 female with history of DVT (2021 in setting of COVID pneumonia), type 2 diabetes (A1c 7.5 12/31/22), severe morbid obesity, PCOS here for annual exam. 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  No other family history of breast cancer.  Pt states she has some odor in her creases when she exercises but uses the cream and powder to help with rash.  Proving but has questions about other products that she can use.  Patient's last menstrual period was 03/05/2023. Period Duration (Days): 7 Period Pattern: (!) Irregular Menstrual Flow: Moderate, Light, Heavy Menstrual Control: Maxi pad Dysmenorrhea: (!) Mild Dysmenorrhea Symptoms: Diarrhea  Abnormal bleeding: No menses since September Pelvic discharge or pain: None Breast mass, nipple discharge or skin changes : None Birth control: None Last PAP:     Component Value Date/Time   DIAGPAP  06/14/2022 1006    - Negative for intraepithelial lesion or malignancy (NILM)   DIAGPAP  03/27/2018 0000    NEGATIVE FOR INTRAEPITHELIAL LESIONS OR MALIGNANCY.   HPVHIGH Negative 06/14/2022 1006   ADEQPAP  06/14/2022 1006    Satisfactory for evaluation; transformation zone component PRESENT.   ADEQPAP  03/27/2018 0000    Satisfactory for evaluation  endocervical/transformation zone component PRESENT.   Last mammogram: 2018 BI-RADS 1 density B Last colonoscopy: Never Sexually active: Yes, no active plans for pregnancy Exercising: Yes walking  GYN HISTORY: No significant history  OB History  Gravida Para Term Preterm AB Living  0 0 0 0 0 0  SAB IAB Ectopic Multiple Live Births  0 0 0 0     Past Medical History:  Diagnosis Date   Allergy    Back pain    Depression    Diabetes mellitus without complication (HCC) 07/20/13   no meds at this time   Dyspnea    GERD (gastroesophageal reflux disease)    PCOS (polycystic ovarian syndrome)    Sleep apnea    Vitamin  D deficiency     Past Surgical History:  Procedure Laterality Date   WISDOM TOOTH EXTRACTION      Current Outpatient Medications on File Prior to Visit  Medication Sig Dispense Refill   nystatin  (MYCOSTATIN /NYSTOP ) powder APPLY  POWDER TOPICALLY TO AFFECTED AREA TWICE DAILY     triamcinolone  (KENALOG) 0.025 % ointment Apply topically.     XIGDUO XR 10-998 MG TB24 TAKE 2 TABLETS BY MOUTH WITH BREAKFAST     acetaminophen  (TYLENOL ) 500 MG tablet Take 500-1,000 mg by mouth every 6 (six) hours as needed for mild pain or headache.     ascorbic acid (VITAMIN C) 250 MG tablet Take 250 mg by mouth daily.     Cholecalciferol (VITAMIN D3) 125 MCG (5000 UT) CAPS Take 5,000 Units by mouth daily.     diphenhydramine-acetaminophen  (TYLENOL  PM) 25-500 MG TABS tablet Take 1 tablet by mouth at bedtime as needed (for sleep).      ELDERBERRY PO Take 1 tablet by mouth daily.      hydrocortisone 2.5 % ointment Apply 1 application topically 2 (two) times daily as needed (for itching).      ketoconazole (NIZORAL) 2 % cream Apply 1 application topically 2 (two) times daily as needed for irritation.      metFORMIN  (GLUCOPHAGE -XR) 500 MG 24 hr tablet Take 500 mg by mouth in the morning, at noon, and at bedtime.      metoprolol  tartrate (LOPRESSOR ) 100 MG tablet Take  1 tablet (100 mg total) by mouth 2 (two) times daily. (Patient taking differently: Take 50 mg by mouth 2 (two) times daily.) 60 tablet 1   rosuvastatin (CRESTOR) 10 MG tablet Take 5 mg by mouth daily.     vitamin B-12 (CYANOCOBALAMIN ) 1000 MCG tablet Take 1,000 mcg by mouth daily.     No current facility-administered medications on file prior to visit.    Social History   Socioeconomic History   Marital status: Married    Spouse name: Lynwood Desiderio Raddle.    Number of children: Not on file   Years of education: Not on file   Highest education level: Not on file  Occupational History   Occupation: Child Psychotherapist    Employer: [employer]    Tobacco Use   Smoking status: Never   Smokeless tobacco: Never  Vaping Use   Vaping status: Never Used  Substance and Sexual Activity   Alcohol use: No   Drug use: No   Sexual activity: Yes    Partners: Male    Birth control/protection: None  Other Topics Concern   Not on file  Social History Narrative   Not on file   Social Drivers of Health   Financial Resource Strain: Low Risk  (08/27/2021)   Received from [facility], [facility]   Overall Financial Resource Strain (CARDIA)    Difficulty of Paying Living Expenses: Not hard at all  Food Insecurity: No Food Insecurity (08/27/2021)   Received from [facility], [facility]   Hunger Vital Sign    Worried About Running Out of Food in the Last Year: Never true    Ran Out of Food in the Last Year: Never true  Transportation Needs: No Transportation Needs (08/27/2021)   Received from [facility], [facility]   PRAPARE - Transportation    Lack of Transportation (Medical): No    Lack of Transportation (Non-Medical): No  Physical Activity: Insufficiently Active (08/27/2021)   Received from [facility], [facility]   Exercise Vital Sign    Days of Exercise per Week: 3 days    Minutes of Exercise per Session: 10 min  Stress: No Stress Concern Present (08/27/2021)   Received from [facility], [facility] of Occupational Health - Occupational Stress Questionnaire    Feeling of Stress : Not at all  Social Connections: Socially Integrated (07/16/2022)   Received from [facility], [facility]   Social Network    How would you rate your social network (family, work, friends)?: Good participation with social networks  Intimate Partner Violence: Not At Risk (07/16/2022)   Received from [facility], [facility]   HITS    Over the last 12 months how often did your partner physically hurt you?: Never    Over the last 12 months how often did your partner insult you or talk down to you?: Never    Over  the last 12 months how often did your partner threaten you with physical harm?: Never    Over the last 12 months how often did your partner scream or curse at you?: Never    Family History  Problem Relation Age of Onset   Diabetes Mother    Hypertension Mother    Hyperlipidemia Mother    Stroke Mother    Cancer Mother    Kidney disease Mother    Sleep apnea Mother    Drug abuse Mother    Obesity Mother    Hypertension Maternal Grandmother  Hyperlipidemia Maternal Grandmother    Drug abuse Father    Breast cancer Cousin 30       chemo is done and upcoming surgery - ?BRCA Negative    No Known Allergies    PE Today's Vitals   06/25/23 1133  BP: (!) 144/78  Pulse: 90  SpO2: 97%  Weight: (!) 316 lb (143.3 kg)  Height: 5' 5.25 (1.657 m)   Body mass index is 52.18 kg/m.  Physical Exam Vitals reviewed. Exam conducted with a chaperone present.  Constitutional:      General: She is not in acute distress.    Appearance: Normal appearance.  HENT:     Head: Normocephalic and atraumatic.     Nose: Nose normal.  Eyes:     Extraocular Movements: Extraocular movements intact.     Conjunctiva/sclera: Conjunctivae normal.  Neck:     Thyroid : No thyroid  mass, thyromegaly or thyroid  tenderness.  Pulmonary:     Effort: Pulmonary effort is normal.  Chest:     Chest wall: No mass or tenderness.  Breasts:    Right: Normal. No swelling, mass, nipple discharge, skin change or tenderness.     Left: Normal. No swelling, mass, nipple discharge, skin change or tenderness.  Abdominal:     General: There is no distension.     Palpations: Abdomen is soft.     Tenderness: There is no abdominal tenderness.  Genitourinary:    General: Normal vulva.     Exam position: Lithotomy position.     Urethra: No prolapse.     Vagina: Normal. No vaginal discharge or bleeding.     Cervix: Normal. No lesion.     Uterus: Normal. Not enlarged and not tender.      Adnexa: Right adnexa normal and  left adnexa normal.  Musculoskeletal:        General: Normal range of motion.     Cervical back: Normal range of motion.  Lymphadenopathy:     Upper Body:     Right upper body: No axillary adenopathy.     Left upper body: No axillary adenopathy.     Lower Body: No right inguinal adenopathy. No left inguinal adenopathy.  Skin:    General: Skin is warm and dry.  Neurological:     General: No focal deficit present.     Mental Status: She is alert.  Psychiatric:        Mood and Affect: Mood normal.        Behavior: Behavior normal.       Assessment and Plan:        Well woman exam with routine gynecological exam Assessment & Plan: Cervical cancer screening performed according to ASCCP guidelines. Encouraged annual mammogram screening at 40yo Labs and immunizations with her primary Encouraged safe sexual practices as indicated Encouraged healthy lifestyle practices with diet and exercise For patients under 50yo, I recommend 1000mg  calcium daily and 600IU of vitamin D  daily.    Oligomenorrhea, unspecified type Assessment & Plan: Continue Provera  every 3 months for withdrawal bleed. Patient provided on importance including risk of endometrial hyperplasia and uterine cancer.  Orders: -     medroxyPROGESTERone  Acetate; Take 1 tablet (10 mg total) by mouth daily. Take every 3 months to induce menses.  Dispense: 10 tablet; Refill: 0    Vera LULLA Pa, MD

## 2023-06-25 NOTE — Assessment & Plan Note (Addendum)
Continue Provera every 3 months for withdrawal bleed. Patient provided on importance including risk of endometrial hyperplasia and uterine cancer.

## 2023-06-25 NOTE — Assessment & Plan Note (Signed)
 Cervical cancer screening performed according to ASCCP guidelines. Encouraged annual mammogram screening at 40yo Labs and immunizations with her primary Encouraged safe sexual practices as indicated Encouraged healthy lifestyle practices with diet and exercise For patients under 50yo, I recommend 1000mg  calcium daily and 600IU of vitamin D daily.

## 2023-06-25 NOTE — Patient Instructions (Signed)

## 2023-12-17 ENCOUNTER — Ambulatory Visit: Admitting: Radiology

## 2024-04-01 ENCOUNTER — Ambulatory Visit: Admitting: Dermatology

## 2024-07-02 ENCOUNTER — Ambulatory Visit: Admitting: Obstetrics and Gynecology

## 2024-10-28 ENCOUNTER — Ambulatory Visit: Admitting: Dermatology
